# Patient Record
Sex: Male | Born: 1955 | Hispanic: No | Marital: Married | Smoking: Never smoker
Health system: Southern US, Community
[De-identification: ages and names within clinical notes are randomized; demographics above are authoritative.]

## PROBLEM LIST (undated history)

## (undated) DIAGNOSIS — F32A Depression, unspecified: Secondary | ICD-10-CM

## (undated) DIAGNOSIS — M1990 Unspecified osteoarthritis, unspecified site: Secondary | ICD-10-CM

## (undated) DIAGNOSIS — I10 Essential (primary) hypertension: Secondary | ICD-10-CM

## (undated) DIAGNOSIS — F329 Major depressive disorder, single episode, unspecified: Secondary | ICD-10-CM

## (undated) DIAGNOSIS — Z8719 Personal history of other diseases of the digestive system: Secondary | ICD-10-CM

## (undated) DIAGNOSIS — K219 Gastro-esophageal reflux disease without esophagitis: Secondary | ICD-10-CM

## (undated) HISTORY — PX: APPENDECTOMY: SHX54

---

## 2003-03-30 ENCOUNTER — Inpatient Hospital Stay (HOSPITAL_COMMUNITY): Admission: EM | Admit: 2003-03-30 | Discharge: 2003-04-05 | Payer: Self-pay | Admitting: Emergency Medicine

## 2006-03-16 ENCOUNTER — Emergency Department (HOSPITAL_COMMUNITY): Admission: EM | Admit: 2006-03-16 | Discharge: 2006-03-16 | Payer: Self-pay | Admitting: Emergency Medicine

## 2015-04-01 ENCOUNTER — Emergency Department (HOSPITAL_COMMUNITY): Payer: Self-pay

## 2015-04-01 ENCOUNTER — Emergency Department (HOSPITAL_COMMUNITY)
Admission: EM | Admit: 2015-04-01 | Discharge: 2015-04-01 | Disposition: A | Discharge status: Home / Self Care | Payer: Self-pay | Attending: Emergency Medicine | Admitting: Emergency Medicine

## 2015-04-01 ENCOUNTER — Encounter (HOSPITAL_COMMUNITY): Payer: Self-pay

## 2015-04-01 DIAGNOSIS — R112 Nausea with vomiting, unspecified: Secondary | ICD-10-CM | POA: Insufficient documentation

## 2015-04-01 DIAGNOSIS — Z79899 Other long term (current) drug therapy: Secondary | ICD-10-CM | POA: Insufficient documentation

## 2015-04-01 DIAGNOSIS — K8020 Calculus of gallbladder without cholecystitis without obstruction: Secondary | ICD-10-CM | POA: Insufficient documentation

## 2015-04-01 DIAGNOSIS — K8050 Calculus of bile duct without cholangitis or cholecystitis without obstruction: Secondary | ICD-10-CM

## 2015-04-01 DIAGNOSIS — R1110 Vomiting, unspecified: Secondary | ICD-10-CM

## 2015-04-01 DIAGNOSIS — Z87891 Personal history of nicotine dependence: Secondary | ICD-10-CM | POA: Insufficient documentation

## 2015-04-01 DIAGNOSIS — R197 Diarrhea, unspecified: Secondary | ICD-10-CM | POA: Insufficient documentation

## 2015-04-01 DIAGNOSIS — Z9049 Acquired absence of other specified parts of digestive tract: Secondary | ICD-10-CM | POA: Insufficient documentation

## 2015-04-01 DIAGNOSIS — K219 Gastro-esophageal reflux disease without esophagitis: Secondary | ICD-10-CM | POA: Insufficient documentation

## 2015-04-01 LAB — URINALYSIS, ROUTINE W REFLEX MICROSCOPIC
Glucose, UA: NEGATIVE mg/dL
HGB URINE DIPSTICK: NEGATIVE
KETONES UR: NEGATIVE mg/dL
LEUKOCYTES UA: NEGATIVE
Nitrite: NEGATIVE
PROTEIN: NEGATIVE mg/dL
Specific Gravity, Urine: 1.027 (ref 1.005–1.030)
Urobilinogen, UA: 0.2 mg/dL (ref 0.0–1.0)
pH: 5.5 (ref 5.0–8.0)

## 2015-04-01 LAB — CBC WITH DIFFERENTIAL/PLATELET
BASOS PCT: 0 % (ref 0–1)
Basophils Absolute: 0 10*3/uL (ref 0.0–0.1)
EOS ABS: 0 10*3/uL (ref 0.0–0.7)
Eosinophils Relative: 0 % (ref 0–5)
HCT: 50 % (ref 39.0–52.0)
Hemoglobin: 17.4 g/dL — ABNORMAL HIGH (ref 13.0–17.0)
Lymphocytes Relative: 3 % — ABNORMAL LOW (ref 12–46)
Lymphs Abs: 0.3 10*3/uL — ABNORMAL LOW (ref 0.7–4.0)
MCH: 31.4 pg (ref 26.0–34.0)
MCHC: 34.8 g/dL (ref 30.0–36.0)
MCV: 90.1 fL (ref 78.0–100.0)
MONOS PCT: 3 % (ref 3–12)
Monocytes Absolute: 0.3 10*3/uL (ref 0.1–1.0)
NEUTROS PCT: 94 % — AB (ref 43–77)
Neutro Abs: 9.5 10*3/uL — ABNORMAL HIGH (ref 1.7–7.7)
Platelets: 202 10*3/uL (ref 150–400)
RBC: 5.55 MIL/uL (ref 4.22–5.81)
RDW: 12.6 % (ref 11.5–15.5)
WBC: 10.1 10*3/uL (ref 4.0–10.5)

## 2015-04-01 LAB — ETHANOL: Alcohol, Ethyl (B): <5 mg/dL (ref <5)

## 2015-04-01 LAB — COMPREHENSIVE METABOLIC PANEL
ALBUMIN: 4.5 g/dL (ref 3.5–5.0)
ALT: 29 U/L (ref 17–63)
ANION GAP: 10 (ref 5–15)
AST: 28 U/L (ref 15–41)
Alkaline Phosphatase: 59 U/L (ref 38–126)
BILIRUBIN TOTAL: 1.4 mg/dL — AB (ref 0.3–1.2)
BUN: 35 mg/dL — AB (ref 6–20)
CHLORIDE: 103 mmol/L (ref 101–111)
CO2: 23 mmol/L (ref 22–32)
CREATININE: 1.14 mg/dL (ref 0.61–1.24)
Calcium: 9 mg/dL (ref 8.9–10.3)
GFR calc Af Amer: >60 mL/min (ref >60)
GFR calc non Af Amer: >60 mL/min (ref >60)
Glucose, Bld: 167 mg/dL — ABNORMAL HIGH (ref 65–99)
Potassium: 4.2 mmol/L (ref 3.5–5.1)
Sodium: 136 mmol/L (ref 135–145)
Total Protein: 8 g/dL (ref 6.5–8.1)

## 2015-04-01 LAB — TROPONIN I: Troponin I: <0.03 ng/mL (ref <0.031)

## 2015-04-01 LAB — LIPASE, BLOOD: Lipase: 16 U/L — ABNORMAL LOW (ref 22–51)

## 2015-04-01 MED ORDER — SODIUM CHLORIDE 0.9 % IV BOLUS (SEPSIS)
1000.0000 mL | Freq: Once | INTRAVENOUS | Status: AC
  Administered 2015-04-01: 1000 mL via INTRAVENOUS

## 2015-04-01 MED ORDER — LORAZEPAM 2 MG/ML IJ SOLN: 1.0000 mg | Freq: Once | INTRAMUSCULAR | Status: DC

## 2015-04-01 MED ORDER — SODIUM CHLORIDE 0.9 % IV BOLUS (SEPSIS)
1000.0000 mL | INTRAVENOUS | Status: AC
  Administered 2015-04-01: 1000 mL via INTRAVENOUS

## 2015-04-01 MED ORDER — ONDANSETRON HCL 4 MG/2ML IJ SOLN
4.0000 mg | Freq: Once | INTRAMUSCULAR | Status: AC
  Administered 2015-04-01: 4 mg via INTRAVENOUS
  Filled 2015-04-01: qty 2

## 2015-04-01 MED ORDER — ONDANSETRON HCL 4 MG PO TABS: 4.0000 mg | ORAL_TABLET | Freq: Three times a day (TID) | ORAL | Status: DC | PRN

## 2015-04-01 NOTE — ED Notes (Signed)
Pt c/o intermittent RLQ pain, emesis, and diarrhea since midnight.  Pain only w/ emesis.  Denies being around anyone sick.  Pt has not taken anything for symptoms.

## 2015-04-01 NOTE — ED Provider Notes (Signed)
CSN: [id number]     Arrival date & time 04/01/15  0801 History   First MD Initiated Contact with Patient 04/01/15 [phone]     Chief Complaint  Patient presents with  . Abdominal Pain  . Emesis  . Diarrhea     (Consider location/radiation/quality/duration/timing/severity/associated sxs/prior Treatment) HPI   Patient is a 59 year old male with history of daily EtOH, daily NSAID use, GERD, and remote history of cholecystitis 4 years ago, presents to emergency department with sudden onset of forceful vomiting and diarrhea that began last night around midnight and lasted through the night until coming to the ER this morning at approximately 8 AM. One episode of vomiting had some blood, but no further episodes.  He has experienced some associated sweats and chills, but denies any fever.  He feels nauseous, but has stopped actively vomiting because he "is empty."  He has right middle quadrant, non-radiating abdominal pain, which is made worse with vomiting episodes and not exacerbated with palpation, movement or eating/drinking.  He denies any RUQ pain, has no epigastric pain, denies reflux or dyspepsia.  He does not have any flank or back pain.  He drinks beer regularly, states he can handle up to a 12 pack of beer "easily."  He states he "did not drink very much" proceeding his vomiting.  He takes 2-4 aleve daily for chronic hip pain.  He denies CP, palpitations, SOB, abdominal distension, LE swelling, tremor, confusion, head ache.  History reviewed. No pertinent past medical history. Past Surgical History  Procedure Laterality Date  . Appendectomy     History reviewed. No pertinent family history. History  Substance Use Topics  . Smoking status: Former Games developer  . Smokeless tobacco: Not on file  . Alcohol Use: Yes    Review of Systems  HENT: Negative.   Eyes: Negative.   Respiratory: Negative.   Gastrointestinal: Negative for constipation, blood in stool, abdominal distention and anal  bleeding.  Endocrine: Negative.   Genitourinary: Negative.   Skin: Negative.  Negative for color change and rash.  Neurological: Negative for dizziness, tremors, seizures, syncope, facial asymmetry, weakness, light-headedness, numbness and headaches.  Psychiatric/Behavioral: Negative.       Allergies  Review of patient's allergies indicates no known allergies.  Home Medications   Prior to Admission medications   Medication Sig Start Date End Date Taking? Authorizing Provider  naproxen sodium (ANAPROX) 220 MG tablet Take 400 mg by mouth 2 (two) times daily as needed (pain).   Yes Historical Provider, MD  omeprazole (PRILOSEC OTC) 20 MG tablet Take 20 mg by mouth daily.   Yes Historical Provider, MD  ondansetron (ZOFRAN) 4 MG tablet Take 1 tablet (4 mg total) by mouth every 8 (eight) hours as needed for nausea or vomiting. 04/01/15   Danelle Berry, PA-C   BP 140/68 mmHg  Pulse 76  Temp(Src) 97.9 F (36.6 C) (Oral)  Resp 18  SpO2 99% Physical Exam  Constitutional: He is oriented to person, place, and time. He appears well-developed and well-nourished. No distress.  HENT:  Head: Normocephalic and atraumatic.  Right Ear: External ear normal.  Left Ear: External ear normal.  Nose: Nose normal.  Mouth/Throat: Oropharynx is clear and moist. Mucous membranes are not pale, dry and not cyanotic. No oropharyngeal exudate, posterior oropharyngeal edema or posterior oropharyngeal erythema.  Eyes: Conjunctivae, EOM and lids are normal. Pupils are equal, round, and reactive to light. Right eye exhibits no chemosis and no discharge. Left eye exhibits no chemosis and no discharge. Right  conjunctiva is not injected. Right conjunctiva has no hemorrhage. Left conjunctiva is not injected. Left conjunctiva has no hemorrhage. No scleral icterus.  Neck: Normal range of motion and full passive range of motion without pain. Neck supple. No hepatojugular reflux and no JVD present. No tracheal tenderness  present. No tracheal deviation present. No thyromegaly present.  Cardiovascular: Normal rate, regular rhythm, normal heart sounds, intact distal pulses and normal pulses.  Exam reveals no gallop, no friction rub and no decreased pulses.   No murmur heard. Pulmonary/Chest: Effort normal and breath sounds normal. No accessory muscle usage or stridor. No tachypnea. No respiratory distress. He has no decreased breath sounds. He has no wheezes. He has no rales. He exhibits no tenderness.  Abdominal: Soft. Bowel sounds are normal. He exhibits no shifting dullness, no distension, no pulsatile liver, no abdominal bruit, no ascites and no mass. There is no hepatomegaly. There is no tenderness. There is no rigidity, no rebound, no guarding, no CVA tenderness, no tenderness at McBurney's point and negative Murphy's sign. No hernia.  Musculoskeletal: Normal range of motion. He exhibits no edema or tenderness.  Lymphadenopathy:    He has no cervical adenopathy.  Neurological: He is alert and oriented to person, place, and time. He has normal reflexes. He displays no tremor. No cranial nerve deficit or sensory deficit. He exhibits normal muscle tone. He displays no seizure activity. Coordination and gait normal.  No ataxia  Skin: Skin is warm, dry and intact. No rash noted. He is not diaphoretic. No cyanosis or erythema. No pallor. Nails show no clubbing.  Psychiatric: He has a normal mood and affect. His speech is normal and behavior is normal. Judgment and thought content normal. Cognition and memory are normal.  Nursing note and vitals reviewed.  ED Course  Procedures (including critical care time) Labs Review Labs Reviewed  COMPREHENSIVE METABOLIC PANEL - Abnormal; Notable for the following:    Glucose, Bld 167 (*)    BUN 35 (*)    Total Bilirubin 1.4 (*)    All other components within normal limits  LIPASE, BLOOD - Abnormal; Notable for the following:    Lipase 16 (*)    All other components within  normal limits  CBC WITH DIFFERENTIAL/PLATELET - Abnormal; Notable for the following:    Hemoglobin 17.4 (*)    Neutrophils Relative % 94 (*)    Neutro Abs 9.5 (*)    Lymphocytes Relative 3 (*)    Lymphs Abs 0.3 (*)    All other components within normal limits  URINALYSIS, ROUTINE W REFLEX MICROSCOPIC (NOT [facility]) - Abnormal; Notable for the following:    Color, Urine AMBER (*)    APPearance CLOUDY (*)    Bilirubin Urine SMALL (*)    All other components within normal limits  ETHANOL  TROPONIN I    Imaging Review [place] Abdomen Complete  04/01/2015   CLINICAL DATA:  Right lower quadrant pain, vomiting, and diarrhea since last night. Prior appendectomy.  EXAM: ULTRASOUND ABDOMEN COMPLETE  COMPARISON:  None.  FINDINGS: Gallbladder: Distended gallbladder measuring approximately 10.4 cm in length and 5.5 cm in width. Slight diffuse gallbladder wall thickening and slight irregularity, with gallbladder wall thickness 4.2 mm. Small amount of debris/ sludge in the gallbladder without definite stones identified. 2.4 mm echogenic focus adherent to the gallbladder wall without posterior shadowing, likely a tiny incidental polyp. Sonographic Murphy sign was negative.  Common bile duct: Diameter: 6 mm  Liver: Slightly heterogeneous echotexture without focal abnormality identified.  IVC: No abnormality visualized.  Pancreas: Not well visualized.  Visualized portion unremarkable.  Spleen: Size and appearance within normal limits.  Right Kidney: Length: 10.6 cm. Echogenicity within normal limits. No mass or hydronephrosis visualized.  Left Kidney: Length: 11.1 cm. Echogenicity within normal limits. No mass or hydronephrosis visualized.  Abdominal aorta: No aneurysm visualized. Proximal abdominal aorta not visualized.  Other findings: None.  IMPRESSION: Mildly hydropic and slightly thick-walled gallbladder containing a small amount of debris/sludge without stones identified. If there is concern for acute  cholecystitis, consider further evaluation with nuclear medicine HIDA scan.   Electronically Signed   By: Sebastian Ache   On: 04/01/2015 11:50    MDM   Final diagnoses:  Vomiting  Biliary colic  Diarrhea    N/V/D with right side of abdominal pain - Ddx: cholecystitis, pancreatitis, viral gastroenteritis, with possible esophageal varicies vs mallory weis tears.  I have very low suspicion for appendicitis, gastric/peptic ulcers, kidney stone, pyelonephritis, bowel obstruction or other acute abdominal pathology.  Given pt's history, suspect some baseline liver and/or pancreas pathology, he does not appear acutely ill at this time, appears dry, but is not jaundice.  Plan - CBC, CMP, lipase, ETOH, IVF w/ zofran, UA, RUQ [place]  Discussed pt with Dr. Littie Deeds who will also see and evaluate pt. - added on EKG and trop  RUQ [place] pertinent for biliary sludge, no cholelithiasis, no concern for cholecystitis - suggests HIDA scan.  Liver slightly heterogeneous Labs pertinent for mild elevation of bili at 1.4, and bilirubin in urine, glucose mildly elevated BUN elevated with elevated hemoglobin - most likely from dehydration. Labs negative for elevated lipase or liver enzymes.    I have discussed these findings with the patient and suggested follow-up with GI.  Pt verbalized understanding.  He had some to the ER because of his wife's concern for his gallbladder.  The patient stated that he was going to stop drinking.  I explained the effects of ETOH, including liver disease, pancreatitis, and other diseases of the heart, lungs and brain.  I additionally explained the signs and sx of alcohol withdrawal and DT's.  Return precautions were given for DT's and acute cholecystitis, which was verbally acknowledged by the pt and his wife.   The pt has had no vomiting while in the ER.  PO trial successful. Vitals reviewed and stable.  Pt was discharged with Zofran and information about viral gastroenteritis, biliary  colic, DT's.  Filed Vitals:   04/01/15 0815 04/01/15 1103  BP: 138/95 140/68  Pulse: 97 76  Temp: 97.9 F (36.6 C)   TempSrc: Oral   Resp: 16 18  SpO2: 98% 99%          Danelle Berry, PA-C 04/02/15 1656  Mirian Mo, MD 04/03/15 1330

## 2015-04-01 NOTE — Discharge Instructions (Signed)
Biliary Colic  °Biliary colic is a steady or irregular pain in the upper abdomen. It is usually under the right side of the rib cage. It happens when gallstones interfere with the normal flow of bile from the gallbladder. Bile is a liquid that helps to digest fats. Bile is made in the liver and stored in the gallbladder. When you eat a meal, bile passes from the gallbladder through the cystic duct and the common bile duct into the small intestine. There, it mixes with partially digested food. If a gallstone blocks either of these ducts, the normal flow of bile is blocked. The muscle cells in the bile duct contract forcefully to try to move the stone. This causes the pain of biliary colic.  °SYMPTOMS  °· A person with biliary colic usually complains of pain in the upper abdomen. This pain can be: °· In the center of the upper abdomen just below the breastbone. °· In the upper-right part of the abdomen, near the gallbladder and liver. °· Spread back toward the right shoulder blade. °· Nausea and vomiting. °· The pain usually occurs after eating. °· Biliary colic is usually triggered by the digestive system's demand for bile. The demand for bile is high after fatty meals. Symptoms can also occur when a person who has been fasting suddenly eats a very large meal. Most episodes of biliary colic pass after 1 to 5 hours. After the most intense pain passes, your abdomen may continue to ache mildly for about 24 hours. °DIAGNOSIS  °After you describe your symptoms, your caregiver will perform a physical exam. He or she will pay attention to the upper right portion of your belly (abdomen). This is the area of your liver and gallbladder. An ultrasound will help your caregiver look for gallstones. Specialized scans of the gallbladder may also be done. Blood tests may be done, especially if you have fever or if your pain persists. °PREVENTION  °Biliary colic can be prevented by controlling the risk factors for gallstones. Some of  these risk factors, such as heredity, increasing age, and pregnancy are a normal part of life. Obesity and a high-fat diet are risk factors you can change through a healthy lifestyle. Women going through menopause who take hormone replacement therapy (estrogen) are also more likely to develop biliary colic. °TREATMENT  °· Pain medication may be prescribed. °· You may be encouraged to eat a fat-free diet. °· If the first episode of biliary colic is severe, or episodes of colic keep retuning, surgery to remove the gallbladder (cholecystectomy) is usually recommended. This procedure can be done through small incisions using an instrument called a laparoscope. The procedure often requires a brief stay in the hospital. Some people can leave the hospital the same day. It is the most widely used treatment in people troubled by painful gallstones. It is effective and safe, with no complications in more than 90% of cases. °· If surgery cannot be done, medication that dissolves gallstones may be used. This medication is expensive and can take months or years to work. Only small stones will dissolve. °· Rarely, medication to dissolve gallstones is combined with a procedure called shock-wave lithotripsy. This procedure uses carefully aimed shock waves to break up gallstones. In many people treated with this procedure, gallstones form again within a few years. °PROGNOSIS  °If gallstones block your cystic duct or common bile duct, you are at risk for repeated episodes of biliary colic. There is also a 25% chance that you will develop   a gallbladder infection(acute cholecystitis), or some other complication of gallstones within 10 to 20 years. If you have surgery, schedule it at a time that is convenient for you and at a time when you are not sick. HOME CARE INSTRUCTIONS   Drink plenty of clear fluids.  Avoid fatty, greasy or fried foods, or any foods that make your pain worse.  Take medications as directed. SEEK MEDICAL  CARE IF:   You develop a fever over 100.5 F (38.1 C).  Your pain gets worse over time.  You develop nausea that prevents you from eating and drinking.  You develop vomiting. SEEK IMMEDIATE MEDICAL CARE IF:   You have continuous or severe belly (abdominal) pain which is not relieved with medications.  You develop nausea and vomiting which is not relieved with medications.  You have symptoms of biliary colic and you suddenly develop a fever and shaking chills. This may signal cholecystitis. Call your caregiver immediately.  You develop a yellow color to your skin or the white part of your eyes (jaundice). Document Released: 02/21/2006 Document Revised: 12/13/2011 Document Reviewed: 05/02/2008 [facility] Patient Information 2015 [place], [place]. This information is not intended to replace advice given to you by your health care provider. Make sure you discuss any questions you have with your health care provider.  Cholecystitis Cholecystitis is an inflammation of your gallbladder. It is usually caused by a buildup of gallstones or sludge (cholelithiasis) in your gallbladder. The gallbladder stores a fluid that helps digest fats (bile). Cholecystitis is serious and needs treatment right away.  CAUSES   Gallstones. Gallstones can block the tube that leads to your gallbladder, causing bile to build up. As bile builds up, the gallbladder becomes inflamed.  Bile duct problems, such as blockage from scarring or kinking.  Tumors. Tumors can stop bile from leaving your gallbladder correctly, causing bile to build up. As bile builds up, the gallbladder becomes inflamed. SYMPTOMS   Nausea.  Vomiting.  Abdominal pain, especially in the upper right area of your abdomen.  Abdominal tenderness or bloating.  Sweating.  Chills.  Fever.  Yellowing of the skin and the whites of the eyes (jaundice). DIAGNOSIS  Your caregiver may order blood tests to look for infection or gallbladder  problems. Your caregiver may also order imaging tests, such as an ultrasound or computed tomography (CT) scan. Further tests may include a hepatobiliary iminodiacetic acid (HIDA) scan. This scan allows your caregiver to see your bile move from the liver to the gallbladder and to the small intestine. TREATMENT  A hospital stay is usually necessary to lessen the inflammation of your gallbladder. You may be required to not eat or drink (fast) for a certain amount of time. You may be given medicine to treat pain or an antibiotic medicine to treat an infection. Surgery may be needed to remove your gallbladder (cholecystectomy) once the inflammation has gone down. Surgery may be needed right away if you develop complications such as death of gallbladder tissue (gangrene) or a tear (perforation) of the gallbladder.  [place] care will depend on your treatment. In general:  If you were given antibiotics, take them as directed. Finish them even if you start to feel better.  Only take over-the-counter or prescription medicines for pain, discomfort, or fever as directed by your caregiver.  Follow a low-fat diet until you see your caregiver again.  Keep all follow-up visits as directed by your caregiver. SEEK IMMEDIATE MEDICAL CARE IF:   Your pain is  increasing and not controlled by medicines.  Your pain moves to another part of your abdomen or to your back.  You have a fever.  You have nausea and vomiting. MAKE SURE YOU:  Understand these instructions.  Will watch your condition.  Will get help right away if you are not doing well or get worse. Document Released: 09/20/2005 Document Revised: 12/13/2011 Document Reviewed: 08/06/2011 [facility] Patient Information 2015 [place]. This information is not intended to replace advice given to you by your health care provider. Make sure you discuss any questions you have with your health care provider.

## 2015-12-10 ENCOUNTER — Encounter (HOSPITAL_COMMUNITY): Payer: Self-pay

## 2015-12-10 ENCOUNTER — Encounter (HOSPITAL_COMMUNITY)
Admission: RE | Admit: 2015-12-10 | Discharge: 2015-12-10 | Disposition: A | Discharge status: Home / Self Care | Payer: BLUE CROSS/BLUE SHIELD | Source: Ambulatory Visit | Attending: Orthopedic Surgery | Admitting: Orthopedic Surgery

## 2015-12-10 DIAGNOSIS — M1612 Unilateral primary osteoarthritis, left hip: Secondary | ICD-10-CM | POA: Diagnosis not present

## 2015-12-10 DIAGNOSIS — Z0183 Encounter for blood typing: Secondary | ICD-10-CM | POA: Diagnosis not present

## 2015-12-10 DIAGNOSIS — Z01812 Encounter for preprocedural laboratory examination: Secondary | ICD-10-CM | POA: Diagnosis not present

## 2015-12-10 HISTORY — DX: Gastro-esophageal reflux disease without esophagitis: K21.9

## 2015-12-10 HISTORY — DX: Rider (driver) (passenger) of other motorcycle injured in unspecified traffic accident, initial encounter: V29.99XA

## 2015-12-10 HISTORY — DX: Major depressive disorder, single episode, unspecified: F32.9

## 2015-12-10 HISTORY — DX: Personal history of other diseases of the digestive system: Z87.19

## 2015-12-10 HISTORY — DX: Unspecified osteoarthritis, unspecified site: M19.90

## 2015-12-10 HISTORY — DX: Essential (primary) hypertension: I10

## 2015-12-10 HISTORY — DX: Depression, unspecified: F32.A

## 2015-12-10 LAB — CBC
HCT: 46 % (ref 39.0–52.0)
Hemoglobin: 15.8 g/dL (ref 13.0–17.0)
MCH: 31.3 pg (ref 26.0–34.0)
MCHC: 34.3 g/dL (ref 30.0–36.0)
MCV: 91.1 fL (ref 78.0–100.0)
PLATELETS: 207 10*3/uL (ref 150–400)
RBC: 5.05 MIL/uL (ref 4.22–5.81)
RDW: 12.2 % (ref 11.5–15.5)
WBC: 7.6 10*3/uL (ref 4.0–10.5)

## 2015-12-10 LAB — BASIC METABOLIC PANEL
Anion gap: 9 (ref 5–15)
BUN: 18 mg/dL (ref 6–20)
CALCIUM: 9.3 mg/dL (ref 8.9–10.3)
CO2: 24 mmol/L (ref 22–32)
CREATININE: 1.06 mg/dL (ref 0.61–1.24)
Chloride: 102 mmol/L (ref 101–111)
GFR calc Af Amer: >60 mL/min (ref >60)
Glucose, Bld: 114 mg/dL — ABNORMAL HIGH (ref 65–99)
Potassium: 4.1 mmol/L (ref 3.5–5.1)
SODIUM: 135 mmol/L (ref 135–145)

## 2015-12-10 LAB — URINALYSIS, ROUTINE W REFLEX MICROSCOPIC
Bilirubin Urine: NEGATIVE
Glucose, UA: NEGATIVE mg/dL
HGB URINE DIPSTICK: NEGATIVE
Ketones, ur: NEGATIVE mg/dL
Leukocytes, UA: NEGATIVE
Nitrite: NEGATIVE
Protein, ur: NEGATIVE mg/dL
SPECIFIC GRAVITY, URINE: 1.004 — AB (ref 1.005–1.030)
pH: 5.5 (ref 5.0–8.0)

## 2015-12-10 LAB — ABO/RH: ABO/RH(D): B POS

## 2015-12-10 LAB — TYPE AND SCREEN
ABO/RH(D): B POS
Antibody Screen: NEGATIVE

## 2015-12-10 LAB — PROTIME-INR
INR: 1.08 (ref 0.00–1.49)
PROTHROMBIN TIME: 13.8 s (ref 11.6–15.2)

## 2015-12-10 LAB — APTT: APTT: 29 s (ref 24–37)

## 2015-12-10 LAB — SURGICAL PCR SCREEN
MRSA, PCR: NEGATIVE
Staphylococcus aureus: POSITIVE — AB

## 2015-12-10 NOTE — Progress Notes (Signed)
EKG epic 04/01/2015

## 2015-12-10 NOTE — Patient Instructions (Signed)
Corey Logan  12/10/2015   Your procedure is scheduled on: Monday December 22, 2015  Report to [facility] Main  Entrance take [facility]  elevators to 3rd floor to  Short Stay Center at 8:15  AM.  Call this number if you have problems the morning of surgery [phone]   Remember: ONLY 1 PERSON MAY GO WITH YOU TO SHORT STAY TO GET  READY MORNING OF YOUR SURGERY.  Do not eat food or drink liquids :After Midnight.     Take these medicines the morning of surgery with A SIP OF WATER: Omeprazole, Amlodipine                                You may not have any metal on your body including hair pins and              piercings  Do not wear jewelry,  lotions, powders or colognes, deodorant              Men may shave face and neck.   Do not bring valuables to the hospital. [place] IS NOT             RESPONSIBLE   FOR VALUABLES.  Contacts, dentures or bridgework may not be worn into surgery.  Leave suitcase in the car. After surgery it may be brought to your room.                Please read over the following fact sheets you were given:MRSA INFORMATION SHEET; INCENTIVE SPIROMETER; BLOOD TRANSFUSION INFORMATION SHEET  _____________________________________________________________________             [facility] - Preparing for Surgery Before surgery, you can play an important role.  Because skin is not sterile, your skin needs to be as free of germs as possible.  You can reduce the number of germs on your skin by washing with CHG (chlorahexidine gluconate) soap before surgery.  CHG is an antiseptic cleaner which kills germs and bonds with the skin to continue killing germs even after washing. Please DO NOT use if you have an allergy to CHG or antibacterial soaps.  If your skin becomes reddened/irritated stop using the CHG and inform your nurse when you arrive at Short Stay. Do not shave (including legs and underarms) for at least 48 hours prior to the first CHG shower.  You  may shave your face/neck. Please follow these instructions carefully:  1.  Shower with CHG Soap the night before surgery and the  morning of Surgery.  2.  If you choose to wash your hair, wash your hair first as usual with your  normal  shampoo.  3.  After you shampoo, rinse your hair and body thoroughly to remove the  shampoo.                           4.  Use CHG as you would any other liquid soap.  You can apply chg directly  to the skin and wash                       Gently with a scrungie or clean washcloth.  5.  Apply the CHG Soap to your body ONLY FROM THE NECK DOWN.   Do not use on face/  open                           Wound or open sores. Avoid contact with eyes, ears mouth and genitals (private parts).                       Wash face,  Genitals (private parts) with your normal soap.             6.  Wash thoroughly, paying special attention to the area where your surgery  will be performed.  7.  Thoroughly rinse your body with warm water from the neck down.  8.  DO NOT shower/wash with your normal soap after using and rinsing off  the CHG Soap.                9.  Pat yourself dry with a clean towel.            10.  Wear clean pajamas.            11.  Place clean sheets on your bed the night of your first shower and do not  sleep with pets. Day of Surgery : Do not apply any lotions/deodorants the morning of surgery.  Please wear clean clothes to the hospital/surgery center.  FAILURE TO FOLLOW THESE INSTRUCTIONS MAY RESULT IN THE CANCELLATION OF YOUR SURGERY PATIENT SIGNATURE_________________________________  NURSE SIGNATURE__________________________________  ________________________________________________________________________   Rogelia MireIncentive Spirometer  An incentive spirometer is a tool that can help keep your lungs clear and active. This tool measures how well you are filling your lungs with each breath. Taking long deep breaths may help reverse or decrease the chance of  developing breathing (pulmonary) problems (especially infection) following:  A long period of time when you are unable to move or be active. BEFORE THE PROCEDURE   If the spirometer includes an indicator to show your best effort, your nurse or respiratory therapist will set it to a desired goal.  If possible, sit up straight or lean slightly forward. Try not to slouch.  Hold the incentive spirometer in an upright position. INSTRUCTIONS FOR USE   Sit on the edge of your bed if possible, or sit up as far as you can in bed or on a chair.  Hold the incentive spirometer in an upright position.  Breathe out normally.  Place the mouthpiece in your mouth and seal your lips tightly around it.  Breathe in slowly and as deeply as possible, raising the piston or the ball toward the top of the column.  Hold your breath for 3-5 seconds or for as long as possible. Allow the piston or ball to fall to the bottom of the column.  Remove the mouthpiece from your mouth and breathe out normally.  Rest for a few seconds and repeat Steps 1 through 7 at least 10 times every 1-2 hours when you are awake. Take your time and take a few normal breaths between deep breaths.  The spirometer may include an indicator to show your best effort. Use the indicator as a goal to work toward during each repetition.  After each set of 10 deep breaths, practice coughing to be sure your lungs are clear. If you have an incision (the cut made at the time of surgery), support your incision when coughing by placing a pillow or rolled up towels firmly against it. Once you are able to get out of bed, walk around indoors and  cough well. You may stop using the incentive spirometer when instructed by your caregiver.  RISKS AND COMPLICATIONS  Take your time so you do not get dizzy or light-headed.  If you are in pain, you may need to take or ask for pain medication before doing incentive spirometry. It is harder to take a deep  breath if you are having pain. AFTER USE  Rest and breathe slowly and easily.  It can be helpful to keep track of a log of your progress. Your caregiver can provide you with a simple table to help with this. If you are using the spirometer at home, follow these instructions: [place] IF:   You are having difficultly using the spirometer.  You have trouble using the spirometer as often as instructed.  Your pain medication is not giving enough relief while using the spirometer.  You develop fever of 100.5 F (38.1 C) or higher. SEEK IMMEDIATE MEDICAL CARE IF:   You cough up bloody sputum that had not been present before.  You develop fever of 102 F (38.9 C) or greater.  You develop worsening pain at or near the incision site. MAKE SURE YOU:   Understand these instructions.  Will watch your condition.  Will get help right away if you are not doing well or get worse. Document Released: 01/31/2007 Document Revised: 12/13/2011 Document Reviewed: 04/03/2007 ExitCare Patient Information 2014 Exit[employer], [place].   ________________________________________________________________________  WHAT IS A BLOOD TRANSFUSION? Blood Transfusion Information  A transfusion is the replacement of blood or some of its parts. Blood is made up of multiple cells which provide different functions.  Red blood cells carry oxygen and are used for blood loss replacement.  White blood cells fight against infection.  Platelets control bleeding.  Plasma helps clot blood.  Other blood products are available for specialized needs, such as hemophilia or other clotting disorders. BEFORE THE TRANSFUSION  Who gives blood for transfusions?   Healthy volunteers who are fully evaluated to make sure their blood is safe. This is blood bank blood. Transfusion therapy is the safest it has ever been in the practice of medicine. Before blood is taken from a donor, a complete history is taken to make sure  that person has no history of diseases nor engages in risky social behavior (examples are intravenous drug use or sexual activity with multiple partners). The donor's travel history is screened to minimize risk of transmitting infections, such as malaria. The donated blood is tested for signs of infectious diseases, such as HIV and hepatitis. The blood is then tested to be sure it is compatible with you in order to minimize the chance of a transfusion reaction. If you or a relative donates blood, this is often done in anticipation of surgery and is not appropriate for emergency situations. It takes many days to process the donated blood. RISKS AND COMPLICATIONS Although transfusion therapy is very safe and saves many lives, the main dangers of transfusion include:   Getting an infectious disease.  Developing a transfusion reaction. This is an allergic reaction to something in the blood you were given. Every precaution is taken to prevent this. The decision to have a blood transfusion has been considered carefully by your caregiver before blood is given. Blood is not given unless the benefits outweigh the risks. AFTER THE TRANSFUSION  Right after receiving a blood transfusion, you will usually feel much better and more energetic. This is especially true if your red blood cells have gotten low (anemic).  The transfusion raises the level of the red blood cells which carry oxygen, and this usually causes an energy increase.  The nurse administering the transfusion will monitor you carefully for complications. HOME CARE INSTRUCTIONS  No special instructions are needed after a transfusion. You may find your energy is better. Speak with your caregiver about any limitations on activity for underlying diseases you may have. SEEK MEDICAL CARE IF:   Your condition is not improving after your transfusion.  You develop redness or irritation at the intravenous (IV) site. SEEK IMMEDIATE MEDICAL CARE IF:  Any of  the following symptoms occur over the next 12 hours:  Shaking chills.  You have a temperature by mouth above 102 F (38.9 C), not controlled by medicine.  Chest, back, or muscle pain.  People around you feel you are not acting correctly or are confused.  Shortness of breath or difficulty breathing.  Dizziness and fainting.  You get a rash or develop hives.  You have a decrease in urine output.  Your urine turns a dark color or changes to pink, red, or brown. Any of the following symptoms occur over the next 10 days:  You have a temperature by mouth above 102 F (38.9 C), not controlled by medicine.  Shortness of breath.  Weakness after normal activity.  The white part of the eye turns yellow (jaundice).  You have a decrease in the amount of urine or are urinating less often.  Your urine turns a dark color or changes to pink, red, or brown. Document Released: 09/17/2000 Document Revised: 12/13/2011 Document Reviewed: 05/06/2008 [facility] Patient Information 2014 [place], [place].  _______________________________________________________________________

## 2015-12-10 NOTE — Progress Notes (Signed)
Clearance note per chart per Dr Nicholos Johnseade 12/08/2015 noting close monitoring of BP pt was just started on Amlodipine 5 mg daily

## 2015-12-10 NOTE — Progress Notes (Addendum)
Surgical screening results in epic per PAT visit 12/10/2015 positive for STAPH. Results sent to Dr Charlann Boxerlin. Prescription for Mupriocin Ointment called to [facility] spoke with Tresa EndoKelly - pharmacist. Spoke with pts wife Tresa EndoKelly.

## 2015-12-11 NOTE — Progress Notes (Signed)
Patients wife notified of surgical time change. Aware pt is to arrive at [facility] at 8:00am 12/22/2015.
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
## 2015-12-22 ENCOUNTER — Inpatient Hospital Stay (HOSPITAL_COMMUNITY): Payer: BLUE CROSS/BLUE SHIELD

## 2015-12-22 ENCOUNTER — Encounter (HOSPITAL_COMMUNITY): Payer: Self-pay | Admitting: *Deleted

## 2015-12-22 ENCOUNTER — Inpatient Hospital Stay (HOSPITAL_COMMUNITY)
Admission: RE | Admit: 2015-12-22 | Discharge: 2015-12-23 | DRG: 470 | Disposition: A | Discharge status: Home Health | Payer: BLUE CROSS/BLUE SHIELD | Source: Ambulatory Visit | Attending: Orthopedic Surgery | Admitting: Orthopedic Surgery

## 2015-12-22 ENCOUNTER — Inpatient Hospital Stay (HOSPITAL_COMMUNITY): Payer: BLUE CROSS/BLUE SHIELD | Admitting: Anesthesiology

## 2015-12-22 ENCOUNTER — Encounter (HOSPITAL_COMMUNITY)
Admission: RE | Disposition: A | Discharge status: Home Health | Payer: Self-pay | Source: Ambulatory Visit | Attending: Orthopedic Surgery

## 2015-12-22 DIAGNOSIS — Z79899 Other long term (current) drug therapy: Secondary | ICD-10-CM | POA: Diagnosis not present

## 2015-12-22 DIAGNOSIS — I10 Essential (primary) hypertension: Secondary | ICD-10-CM | POA: Diagnosis present

## 2015-12-22 DIAGNOSIS — M25552 Pain in left hip: Secondary | ICD-10-CM | POA: Diagnosis present

## 2015-12-22 DIAGNOSIS — Z6829 Body mass index (BMI) 29.0-29.9, adult: Secondary | ICD-10-CM | POA: Diagnosis not present

## 2015-12-22 DIAGNOSIS — E663 Overweight: Secondary | ICD-10-CM | POA: Diagnosis present

## 2015-12-22 DIAGNOSIS — M1612 Unilateral primary osteoarthritis, left hip: Secondary | ICD-10-CM | POA: Diagnosis present

## 2015-12-22 DIAGNOSIS — Z96649 Presence of unspecified artificial hip joint: Secondary | ICD-10-CM

## 2015-12-22 DIAGNOSIS — K219 Gastro-esophageal reflux disease without esophagitis: Secondary | ICD-10-CM | POA: Diagnosis present

## 2015-12-22 DIAGNOSIS — Z01812 Encounter for preprocedural laboratory examination: Secondary | ICD-10-CM | POA: Diagnosis not present

## 2015-12-22 HISTORY — PX: TOTAL HIP ARTHROPLASTY: SHX124

## 2015-12-22 SURGERY — ARTHROPLASTY, HIP, TOTAL, ANTERIOR APPROACH
Anesthesia: Spinal | Site: Hip | Laterality: Left

## 2015-12-22 MED ORDER — HYDROMORPHONE HCL 1 MG/ML IJ SOLN
INTRAMUSCULAR | Status: AC
  Filled 2015-12-22: qty 1

## 2015-12-22 MED ORDER — PROPOFOL 10 MG/ML IV BOLUS
INTRAVENOUS | Status: AC
Start: 2015-12-22 — End: 2015-12-22
  Filled 2015-12-22: qty 20

## 2015-12-22 MED ORDER — BUPIVACAINE HCL (PF) 0.75 % IJ SOLN
INTRAMUSCULAR | Status: DC | PRN
  Administered 2015-12-22: 15 mg via INTRATHECAL

## 2015-12-22 MED ORDER — PROPOFOL 10 MG/ML IV BOLUS
INTRAVENOUS | Status: AC
  Filled 2015-12-22: qty 20

## 2015-12-22 MED ORDER — LACTATED RINGERS IV SOLN: INTRAVENOUS | Status: DC

## 2015-12-22 MED ORDER — ALUM & MAG HYDROXIDE-SIMETH 200-200-20 MG/5ML PO SUSP: 30.0000 mL | ORAL | Status: DC | PRN

## 2015-12-22 MED ORDER — BISACODYL 10 MG RE SUPP
10.0000 mg | Freq: Every day | RECTAL | Status: DC | PRN
Start: 2015-12-22 — End: 2015-12-23

## 2015-12-22 MED ORDER — SODIUM CHLORIDE 0.9 % IV SOLN
100.0000 mL/h | INTRAVENOUS | Status: DC
  Administered 2015-12-22 – 2015-12-23 (×2): 100 mL/h via INTRAVENOUS
  Filled 2015-12-22 (×4): qty 1000

## 2015-12-22 MED ORDER — DIPHENHYDRAMINE HCL 25 MG PO CAPS: 25.0000 mg | ORAL_CAPSULE | Freq: Four times a day (QID) | ORAL | Status: DC | PRN

## 2015-12-22 MED ORDER — CEFAZOLIN SODIUM-DEXTROSE 2-3 GM-% IV SOLR
2.0000 g | Freq: Four times a day (QID) | INTRAVENOUS | Status: AC
  Administered 2015-12-22 (×2): 2 g via INTRAVENOUS
  Filled 2015-12-22 (×2): qty 50

## 2015-12-22 MED ORDER — CELECOXIB 200 MG PO CAPS
200.0000 mg | ORAL_CAPSULE | Freq: Two times a day (BID) | ORAL | Status: DC
  Administered 2015-12-22 – 2015-12-23 (×2): 200 mg via ORAL
  Filled 2015-12-22 (×3): qty 1

## 2015-12-22 MED ORDER — ASPIRIN 325 MG PO TBEC: 325.0000 mg | DELAYED_RELEASE_TABLET | Freq: Two times a day (BID) | ORAL | Status: AC

## 2015-12-22 MED ORDER — FERROUS SULFATE 325 (65 FE) MG PO TABS
325.0000 mg | ORAL_TABLET | Freq: Three times a day (TID) | ORAL | Status: DC
  Administered 2015-12-22 – 2015-12-23 (×2): 325 mg via ORAL
  Filled 2015-12-22 (×5): qty 1

## 2015-12-22 MED ORDER — PHENYLEPHRINE HCL 10 MG/ML IJ SOLN
INTRAMUSCULAR | Status: DC | PRN
  Administered 2015-12-22 (×7): 80 ug via INTRAVENOUS

## 2015-12-22 MED ORDER — PHENYLEPHRINE 40 MCG/ML (10ML) SYRINGE FOR IV PUSH (FOR BLOOD PRESSURE SUPPORT)
PREFILLED_SYRINGE | INTRAVENOUS | Status: AC
  Filled 2015-12-22: qty 10

## 2015-12-22 MED ORDER — SODIUM CHLORIDE 0.9 % IR SOLN
Status: DC | PRN
  Administered 2015-12-22: 1000 mL

## 2015-12-22 MED ORDER — MENTHOL 3 MG MT LOZG: 1.0000 | LOZENGE | OROMUCOSAL | Status: DC | PRN

## 2015-12-22 MED ORDER — TRANEXAMIC ACID 1000 MG/10ML IV SOLN
1000.0000 mg | Freq: Once | INTRAVENOUS | Status: AC
  Administered 2015-12-22: 1000 mg via INTRAVENOUS
  Filled 2015-12-22: qty 10

## 2015-12-22 MED ORDER — METOCLOPRAMIDE HCL 10 MG PO TABS: 5.0000 mg | ORAL_TABLET | Freq: Three times a day (TID) | ORAL | Status: DC | PRN

## 2015-12-22 MED ORDER — METOCLOPRAMIDE HCL 5 MG/ML IJ SOLN: 5.0000 mg | Freq: Three times a day (TID) | INTRAMUSCULAR | Status: DC | PRN

## 2015-12-22 MED ORDER — PHENOL 1.4 % MT LIQD
1.0000 | OROMUCOSAL | Status: DC | PRN
Start: 2015-12-22 — End: 2015-12-23
  Filled 2015-12-22: qty 177

## 2015-12-22 MED ORDER — MIDAZOLAM HCL 2 MG/2ML IJ SOLN
INTRAMUSCULAR | Status: DC | PRN
  Administered 2015-12-22: 2 mg via INTRAVENOUS

## 2015-12-22 MED ORDER — DOCUSATE SODIUM 100 MG PO CAPS
100.0000 mg | ORAL_CAPSULE | Freq: Two times a day (BID) | ORAL | Status: DC
  Administered 2015-12-22 – 2015-12-23 (×2): 100 mg via ORAL

## 2015-12-22 MED ORDER — HYDROCODONE-ACETAMINOPHEN 7.5-325 MG PO TABS
1.0000 | ORAL_TABLET | ORAL | Status: DC
  Administered 2015-12-22: 1 via ORAL
  Administered 2015-12-22: 2 via ORAL
  Administered 2015-12-22 – 2015-12-23 (×2): 1 via ORAL
  Administered 2015-12-23: 2 via ORAL
  Filled 2015-12-22: qty 2
  Filled 2015-12-22 (×3): qty 1
  Filled 2015-12-22: qty 2

## 2015-12-22 MED ORDER — CEFAZOLIN SODIUM-DEXTROSE 2-3 GM-% IV SOLR
INTRAVENOUS | Status: AC
  Filled 2015-12-22: qty 50

## 2015-12-22 MED ORDER — ONDANSETRON HCL 4 MG/2ML IJ SOLN
INTRAMUSCULAR | Status: AC
  Filled 2015-12-22: qty 2

## 2015-12-22 MED ORDER — PROPOFOL 500 MG/50ML IV EMUL
INTRAVENOUS | Status: DC | PRN
  Administered 2015-12-22: 100 ug/kg/min via INTRAVENOUS

## 2015-12-22 MED ORDER — KETAMINE HCL 10 MG/ML IJ SOLN
INTRAMUSCULAR | Status: AC
  Filled 2015-12-22: qty 1

## 2015-12-22 MED ORDER — POLYETHYLENE GLYCOL 3350 17 G PO PACK
17.0000 g | PACK | Freq: Two times a day (BID) | ORAL | Status: DC
  Administered 2015-12-22 – 2015-12-23 (×2): 17 g via ORAL

## 2015-12-22 MED ORDER — DEXAMETHASONE SODIUM PHOSPHATE 10 MG/ML IJ SOLN
INTRAMUSCULAR | Status: AC
  Filled 2015-12-22: qty 1

## 2015-12-22 MED ORDER — DOCUSATE SODIUM 100 MG PO CAPS: 100.0000 mg | ORAL_CAPSULE | Freq: Two times a day (BID) | ORAL | Status: DC

## 2015-12-22 MED ORDER — HYDROCODONE-ACETAMINOPHEN 7.5-325 MG PO TABS: 1.0000 | ORAL_TABLET | ORAL | Status: DC | PRN

## 2015-12-22 MED ORDER — FERROUS SULFATE 325 (65 FE) MG PO TABS: 325.0000 mg | ORAL_TABLET | Freq: Three times a day (TID) | ORAL | Status: DC

## 2015-12-22 MED ORDER — HYDROMORPHONE HCL 1 MG/ML IJ SOLN
0.5000 mg | INTRAMUSCULAR | Status: DC | PRN
  Administered 2015-12-22: 0.5 mg via INTRAVENOUS
  Filled 2015-12-22: qty 1

## 2015-12-22 MED ORDER — AMLODIPINE BESYLATE 5 MG PO TABS
5.0000 mg | ORAL_TABLET | Freq: Every day | ORAL | Status: DC
  Administered 2015-12-23: 5 mg via ORAL
  Filled 2015-12-22: qty 1

## 2015-12-22 MED ORDER — ONDANSETRON HCL 4 MG/2ML IJ SOLN
INTRAMUSCULAR | Status: DC | PRN
  Administered 2015-12-22: 4 mg via INTRAVENOUS

## 2015-12-22 MED ORDER — FENTANYL CITRATE (PF) 100 MCG/2ML IJ SOLN
INTRAMUSCULAR | Status: AC
  Filled 2015-12-22: qty 2

## 2015-12-22 MED ORDER — LACTATED RINGERS IV SOLN
INTRAVENOUS | Status: DC
  Administered 2015-12-22: 1000 mL via INTRAVENOUS
  Administered 2015-12-22: 11:00:00 via INTRAVENOUS

## 2015-12-22 MED ORDER — MAGNESIUM CITRATE PO SOLN: 1.0000 | Freq: Once | ORAL | Status: DC | PRN

## 2015-12-22 MED ORDER — CHLORHEXIDINE GLUCONATE 4 % EX LIQD: 60.0000 mL | Freq: Once | CUTANEOUS | Status: DC

## 2015-12-22 MED ORDER — ONDANSETRON HCL 4 MG PO TABS: 4.0000 mg | ORAL_TABLET | Freq: Four times a day (QID) | ORAL | Status: DC | PRN

## 2015-12-22 MED ORDER — MIDAZOLAM HCL 2 MG/2ML IJ SOLN
INTRAMUSCULAR | Status: AC
  Filled 2015-12-22: qty 2

## 2015-12-22 MED ORDER — METHOCARBAMOL 500 MG PO TABS: 500.0000 mg | ORAL_TABLET | Freq: Four times a day (QID) | ORAL | Status: DC | PRN

## 2015-12-22 MED ORDER — METHOCARBAMOL 500 MG PO TABS
500.0000 mg | ORAL_TABLET | Freq: Four times a day (QID) | ORAL | Status: DC | PRN
  Administered 2015-12-23: 500 mg via ORAL
  Filled 2015-12-22: qty 1

## 2015-12-22 MED ORDER — DEXAMETHASONE SODIUM PHOSPHATE 10 MG/ML IJ SOLN
10.0000 mg | Freq: Once | INTRAMUSCULAR | Status: AC
  Administered 2015-12-23: 10 mg via INTRAVENOUS
  Filled 2015-12-22: qty 1

## 2015-12-22 MED ORDER — HYDROMORPHONE HCL 1 MG/ML IJ SOLN
0.2500 mg | INTRAMUSCULAR | Status: DC | PRN
  Administered 2015-12-22 (×2): 0.5 mg via INTRAVENOUS

## 2015-12-22 MED ORDER — CEFAZOLIN SODIUM-DEXTROSE 2-3 GM-% IV SOLR
2.0000 g | INTRAVENOUS | Status: AC
  Administered 2015-12-22: 2 g via INTRAVENOUS

## 2015-12-22 MED ORDER — ONDANSETRON HCL 4 MG/2ML IJ SOLN: 4.0000 mg | Freq: Four times a day (QID) | INTRAMUSCULAR | Status: DC | PRN

## 2015-12-22 MED ORDER — OMEPRAZOLE 20 MG PO CPDR
20.0000 mg | DELAYED_RELEASE_CAPSULE | Freq: Every day | ORAL | Status: DC
  Administered 2015-12-23: 20 mg via ORAL
  Filled 2015-12-22: qty 1

## 2015-12-22 MED ORDER — POLYETHYLENE GLYCOL 3350 17 G PO PACK: 17.0000 g | PACK | Freq: Two times a day (BID) | ORAL | Status: DC

## 2015-12-22 MED ORDER — ASPIRIN EC 325 MG PO TBEC
325.0000 mg | DELAYED_RELEASE_TABLET | Freq: Two times a day (BID) | ORAL | Status: DC
  Administered 2015-12-23: 325 mg via ORAL
  Filled 2015-12-22 (×3): qty 1

## 2015-12-22 MED ORDER — DEXTROSE 5 % IV SOLN
500.0000 mg | Freq: Four times a day (QID) | INTRAVENOUS | Status: DC | PRN
  Administered 2015-12-22: 500 mg via INTRAVENOUS
  Filled 2015-12-22 (×2): qty 5

## 2015-12-22 MED ORDER — DEXAMETHASONE SODIUM PHOSPHATE 10 MG/ML IJ SOLN
10.0000 mg | Freq: Once | INTRAMUSCULAR | Status: AC
  Administered 2015-12-22: 10 mg via INTRAVENOUS

## 2015-12-22 SURGICAL SUPPLY — 35 items
BAG DECANTER FOR FLEXI CONT (MISCELLANEOUS) IMPLANT
BAG SPEC THK2 15X12 ZIP CLS (MISCELLANEOUS)
BAG ZIPLOCK 12X15 (MISCELLANEOUS) IMPLANT
CAPT HIP TOTAL 2 ×2 IMPLANT
CLOTH BEACON ORANGE TIMEOUT ST (SAFETY) ×3 IMPLANT
COVER PERINEAL POST (MISCELLANEOUS) ×3 IMPLANT
DRAPE STERI IOBAN 125X83 (DRAPES) ×3 IMPLANT
DRAPE U-SHAPE 47X51 STRL (DRAPES) ×6 IMPLANT
DRSG AQUACEL AG ADV 3.5X10 (GAUZE/BANDAGES/DRESSINGS) ×3 IMPLANT
DURAPREP 26ML APPLICATOR (WOUND CARE) ×3 IMPLANT
ELECT REM PT RETURN 15FT ADLT (MISCELLANEOUS) IMPLANT
ELECT REM PT RETURN 9FT ADLT (ELECTROSURGICAL) ×3
ELECTRODE REM PT RTRN 9FT ADLT (ELECTROSURGICAL) ×1 IMPLANT
GLOVE BIOGEL M 7.0 STRL (GLOVE) IMPLANT
GLOVE BIOGEL PI IND STRL 7.5 (GLOVE) ×1 IMPLANT
GLOVE BIOGEL PI IND STRL 8.5 (GLOVE) ×1 IMPLANT
GLOVE BIOGEL PI INDICATOR 7.5 (GLOVE) ×2
GLOVE BIOGEL PI INDICATOR 8.5 (GLOVE) ×2
GLOVE ECLIPSE 8.0 STRL XLNG CF (GLOVE) ×6 IMPLANT
GLOVE ORTHO TXT STRL SZ7.5 (GLOVE) ×3 IMPLANT
GOWN STRL REUS W/TWL LRG LVL3 (GOWN DISPOSABLE) ×7 IMPLANT
GOWN STRL REUS W/TWL XL LVL3 (GOWN DISPOSABLE) ×5 IMPLANT
HOLDER FOLEY CATH W/STRAP (MISCELLANEOUS) ×3 IMPLANT
LIQUID BAND (GAUZE/BANDAGES/DRESSINGS) ×3 IMPLANT
PACK ANTERIOR HIP CUSTOM (KITS) ×3 IMPLANT
SAW OSC TIP CART 19.5X105X1.3 (SAW) ×3 IMPLANT
SUT MNCRL AB 4-0 PS2 18 (SUTURE) ×3 IMPLANT
SUT VIC AB 1 CT1 36 (SUTURE) ×9 IMPLANT
SUT VIC AB 2-0 CT1 27 (SUTURE) ×6
SUT VIC AB 2-0 CT1 TAPERPNT 27 (SUTURE) ×2 IMPLANT
SUT VLOC 180 0 24IN GS25 (SUTURE) ×3 IMPLANT
TRAY FOLEY W/METER SILVER 14FR (SET/KITS/TRAYS/PACK) IMPLANT
TRAY FOLEY W/METER SILVER 16FR (SET/KITS/TRAYS/PACK) ×2 IMPLANT
WATER STERILE IRR 1500ML POUR (IV SOLUTION) ×3 IMPLANT
YANKAUER SUCT BULB TIP 10FT TU (MISCELLANEOUS) IMPLANT

## 2015-12-22 NOTE — Progress Notes (Signed)
Portable AP Pelvis and Lateral Left Hip X-rays done. 

## 2015-12-22 NOTE — Anesthesia Preprocedure Evaluation (Addendum)
Anesthesia Evaluation  Patient identified by MRN, date of birth, ID band Patient awake    Reviewed: Allergy & Precautions, H&P , NPO status , Patient's Chart, lab work & pertinent test results  Airway Mallampati: II  TM Distance: >3 FB Neck ROM: full    Dental no notable dental hx. (+) Dental Advisory Given, Teeth Intact   Pulmonary neg pulmonary ROS,    Pulmonary exam normal breath sounds clear to auscultation       Cardiovascular Exercise Tolerance: Good hypertension, Pt. on medications Normal cardiovascular exam Rhythm:regular Rate:Normal     Neuro/Psych negative neurological ROS  negative psych ROS   GI/Hepatic Neg liver ROS, GERD  Medicated and Controlled,  Endo/Other  negative endocrine ROS  Renal/GU negative Renal ROS  negative genitourinary   Musculoskeletal   Abdominal   Peds  Hematology negative hematology ROS (+)   Anesthesia Other Findings   Reproductive/Obstetrics negative OB ROS                            Anesthesia Physical Anesthesia Plan  ASA: II  Anesthesia Plan: Spinal   Post-op Pain Management:    Induction:   Airway Management Planned:   Additional Equipment:   Intra-op Plan:   Post-operative Plan:   Informed Consent: I have reviewed the patients History and Physical, chart, labs and discussed the procedure including the risks, benefits and alternatives for the proposed anesthesia with the patient or authorized representative who has indicated his/her understanding and acceptance.   Dental Advisory Given  Plan Discussed with: CRNA and Surgeon  Anesthesia Plan Comments:        Anesthesia Quick Evaluation

## 2015-12-22 NOTE — Anesthesia Procedure Notes (Signed)
Spinal Patient location during procedure: OR Start time: 12/22/2015 10:00 AM End time: 12/22/2015 10:04 AM Staffing Anesthesiologist: EWELL, CHARLES Performed by: anesthesiologist  Preanesthetic Checklist Completed: patient identified, site marked, surgical consent, pre-op evaluation, timeout performed, IV checked, risks and benefits discussed and monitors and equipment checked Spinal Block Patient position: sitting Prep: Betadine Patient monitoring: heart rate, continuous pulse ox and blood pressure Approach: midline Location: L3-4 Injection technique: single-shot Needle Needle type: Sprotte and Whitacre  Needle gauge: 25 G Needle length: 9 cm Assessment Sensory level: T6 Additional Notes Expiration date of kit checked and confirmed. Patient tolerated procedure well, without complications.     

## 2015-12-22 NOTE — Evaluation (Signed)
Physical Therapy Evaluation Patient Details Name: Corey Logan MRN: [id number] DOB: 07-09-1956 Today's Date: 12/22/2015   History of Present Illness  [name]  Clinical Impression  The patient reports increased HA while ambulating, also thigh pain. Tolerated ambulation x 145'. Pt. Will benefit from PT to address problems listed in the note below to DC to home.    Follow Up Recommendations Home health PT;Supervision/Assistance - 24 hour    Equipment Recommendations  None recommended by PT    Recommendations for Other Services       Precautions / Restrictions Precautions Precautions: Fall Restrictions Weight Bearing Restrictions: No      Mobility  Bed Mobility Overal bed mobility: Needs Assistance Bed Mobility: Supine to Sit;Sit to Supine     Supine to sit: Min assist Sit to supine: Min assist   General bed mobility comments: assist with the  left Leg onto bed, cues for technique.  Transfers Overall transfer level: Needs assistance Equipment used: Rolling walker (2 wheeled) Transfers: Sit to/from Stand Sit to Stand: Min assist;From elevated surface         General transfer comment: cues for safety and technique  Ambulation/Gait Ambulation/Gait assistance: +2 safety/equipment Ambulation Distance (Feet): 145 Feet Assistive device: Rolling walker (2 wheeled) Gait Pattern/deviations: Step-through pattern     General Gait Details: minimal  antalgia, C/O HA WHILE AMBULATING, cues for sequence  Stairs            Wheelchair Mobility    Modified Rankin (Stroke Patients Only)       Balance                                             Pertinent Vitals/Pain Pain Assessment: 0-10 Pain Score: 7  Pain Location: L thigh, HA Pain Intervention(s): Limited activity within patient's tolerance;Monitored during session;Premedicated before session;Ice applied    Home Living Family/patient expects to be discharged to:: Private  residence Living Arrangements: Spouse/significant other Available Help at Discharge: Family Type of Home: House Home Access: Stairs to enter   Secretary/administrator of Steps: 1 Home Layout: One level Home Equipment: Environmental consultant - 2 wheels;Bedside commode      Prior Function Level of Independence: Independent               Hand Dominance        Extremity/Trunk Assessment   Upper Extremity Assessment: Defer to OT evaluation           Lower Extremity Assessment: LLE deficits/detail   LLE Deficits / Details: flexes the hip  in supine  Cervical / Trunk Assessment: Normal  Communication   Communication: No difficulties  Cognition Arousal/Alertness: Awake/alert Behavior During Therapy: WFL for tasks assessed/performed Overall Cognitive Status: Within Functional Limits for tasks assessed                      General Comments      Exercises Total Joint Exercises Heel Slides: AAROM;Left;Supine      Assessment/Plan    PT Assessment Patient needs continued PT services  PT Diagnosis Difficulty walking;Acute pain   PT Problem List Decreased strength;Decreased range of motion;Decreased activity tolerance;Decreased mobility;Decreased knowledge of precautions;Decreased safety awareness;Decreased knowledge of use of DME;Pain  PT Treatment Interventions DME instruction;Gait training;Stair training;Functional mobility training;Therapeutic activities;Therapeutic exercise;Patient/family education   PT Goals (Current goals can be found in the Care Plan section) Acute Rehab  PT Goals Patient Stated Goal: to go home tomorrow. PT Goal Formulation: With patient Time For Goal Achievement: 12/25/15 Potential to Achieve Goals: Good    Frequency 7X/week   Barriers to discharge        Co-evaluation               End of Session   Activity Tolerance: Patient limited by pain;Patient tolerated treatment well Patient left: in bed;with call bell/phone within  reach;with bed alarm set Nurse Communication: Mobility status         Time: [id number] PT Time Calculation (min) (ACUTE ONLY): 14 min   Charges:   PT Evaluation $PT Eval Low Complexity: 1 Procedure     PT G CodesRada Logan:        Corey Logan 12/22/2015, 4:15 PM Corey Logan PT [phone]

## 2015-12-22 NOTE — Op Note (Signed)
NAME:  Corey Logan                ACCOUNT NO.: [id number]      MEDICAL RECORD NO.: [id number]      FACILITY:  [facility]      PHYSICIAN:  Durene Romans D  DATE OF BIRTH:  1956/09/25     DATE OF PROCEDURE:  12/22/2015                                 OPERATIVE REPORT         PREOPERATIVE DIAGNOSIS: Left  hip osteoarthritis.      POSTOPERATIVE DIAGNOSIS:  Left hip osteoarthritis.      PROCEDURE:  Left total hip replacement through an anterior approach   utilizing DePuy THR system, component size 52mm pinnacle cup, a size 36+4 neutral   Altrex liner, a size 5 Hi Tri Lock stem with a 36+8.5 delta ceramic   ball.      SURGEON:  Madlyn Frankel. Charlann Boxer, M.D.      ASSISTANT:  Lanney Gins, PA-C      ANESTHESIA:  Spinal.      SPECIMENS:  None.      COMPLICATIONS:  None.      BLOOD LOSS:  275 cc     DRAINS:  None      INDICATION OF THE PROCEDURE:  Corey Logan is a 60 y.o. male who had   presented to office for evaluation of left hip pain.  Radiographs revealed   progressive degenerative changes with bone-on-bone   articulation to the  hip joint.  The patient had painful limited range of   motion significantly affecting their overall quality of life.  The patient was failing to    respond to conservative measures, and at this point was ready   to proceed with more definitive measures.  The patient has noted progressive   degenerative changes in his hip, progressive problems and dysfunction   with regarding the hip prior to surgery.  Consent was obtained for   benefit of pain relief.  Specific risk of infection, DVT, component   failure, dislocation, need for revision surgery, as well discussion of   the anterior versus posterior approach were reviewed.  Consent was   obtained for benefit of anterior pain relief through an anterior   approach.      PROCEDURE IN DETAIL:  The patient was brought to operative theater.   Once adequate anesthesia, preoperative  antibiotics, 2gm of Ancef, 1 gm of Tranexamic Acid, and 10 mg of Decadron administered.   The patient was positioned supine on the OSI Hanna table.  Once adequate   padding of boney process was carried out, we had predraped out the hip, and  used fluoroscopy to confirm orientation of the pelvis and position.      The left hip was then prepped and draped from proximal iliac crest to   mid thigh with shower curtain technique.      Time-out was performed identifying the patient, planned procedure, and   extremity.     An incision was then made 2 cm distal and lateral to the   anterior superior iliac spine extending over the orientation of the   tensor fascia lata muscle and sharp dissection was carried down to the   fascia of the muscle and protractor placed in the soft tissues.      The fascia  was then incised.  The muscle belly was identified and swept   laterally and retractor placed along the superior neck.  Following   cauterization of the circumflex vessels and removing some pericapsular   fat, a second cobra retractor was placed on the inferior neck.  A third   retractor was placed on the anterior acetabulum after elevating the   anterior rectus.  A L-capsulotomy was along the line of the   superior neck to the trochanteric fossa, then extended proximally and   distally.  Tag sutures were placed and the retractors were then placed   intracapsular.  We then identified the trochanteric fossa and   orientation of my neck cut, confirmed this radiographically   and then made a neck osteotomy with the femur on traction.  The femoral   head was removed without difficulty or complication.  Traction was let   off and retractors were placed posterior and anterior around the   acetabulum.      The labrum and foveal tissue were debrided.  I began reaming with a 47mm   reamer and reamed up to 51mm reamer with good bony bed preparation and a 52mm   cup was chosen.  The final 52mm Pinnacle cup  was then impacted under fluoroscopy  to confirm the depth of penetration and orientation with respect to   abduction.  A screw was placed followed by the hole eliminator.  The final   36+4 neutral Altrex liner was impacted with good visualized rim fit.  The cup was positioned anatomically within the acetabular portion of the pelvis.      At this point, the femur was rolled at 80 degrees.  Further capsule was   released off the inferior aspect of the femoral neck.  I then   released the superior capsule proximally.  The hook was placed laterally   along the femur and elevated manually and held in position with the bed   hook.  The leg was then extended and adducted with the leg rolled to 100   degrees of external rotation.  Once the proximal femur was fully   exposed, I used a box osteotome to set orientation.  I then began   broaching with the starting chili pepper broach and passed this by hand and then broached up to 5.  With the 5 broach in place I chose a high offset neck and did a trial reduction.  The offset was appropriate, leg lengths   appeared to be equal, confirmed radiographically.   Given these findings, I went ahead and dislocated the hip, repositioned all   retractors and positioned the right hip in the extended and abducted position.  The final 5 Hi Tri Lock stem was   chosen and it was impacted down to the level of neck cut.  Based on this   and the trial reduction, a 36+8.5 delta ceramic ball was chosen and   impacted onto a clean and dry trunnion, and the hip was reduced.  The   hip had been irrigated throughout the case again at this point.  I did   reapproximate the superior capsular leaflet to the anterior leaflet   using #1 Vicryl.  The fascia of the   tensor fascia lata muscle was then reapproximated using #1 Vicryl and #0 V-lock.  The   remaining wound was closed with 2-0 Vicryl and running 4-0 Monocryl.   The hip was cleaned, dried, and dressed sterilely using  Dermabond and  Aquacel dressing.  He was then brought   to recovery room in stable condition tolerating the procedure well.    Lanney Gins, PA-C was present for the entirety of the case involved from   preoperative positioning, perioperative retractor management, general   facilitation of the case, as well as primary wound closure as assistant.            Madlyn Frankel Charlann Boxer, M.D.        12/22/2015 11:20 AM

## 2015-12-22 NOTE — Interval H&P Note (Signed)
History and Physical Interval Note:  12/22/2015 8:49 AM  Spero CurbJames K Ipock  has presented today for surgery, with the diagnosis of left hip osteoarthritis  The various methods of treatment have been discussed with the patient and family. After consideration of risks, benefits and other options for treatment, the patient has consented to  Procedure(s): LEFT TOTAL HIP ARTHROPLASTY ANTERIOR APPROACH (Left) as a surgical intervention .  The patient's history has been reviewed, patient examined, no change in status, stable for surgery.  I have reviewed the patient's chart and labs.  Questions were answered to the patient's satisfaction.     Shelda PalLIN,Caralee Morea D

## 2015-12-22 NOTE — Progress Notes (Signed)
X-ray results noted 

## 2015-12-22 NOTE — Discharge Instructions (Signed)

## 2015-12-22 NOTE — Anesthesia Postprocedure Evaluation (Signed)
Anesthesia Post Note  Patient: Corey Logan  Procedure(s) Performed: Procedure(s) (LRB): LEFT TOTAL HIP ARTHROPLASTY ANTERIOR APPROACH (Left)  Patient location during evaluation: PACU Anesthesia Type: Spinal Level of consciousness: awake and alert Pain management: pain level controlled Vital Signs Assessment: post-procedure vital signs reviewed and stable Respiratory status: spontaneous breathing, nonlabored ventilation, respiratory function stable and patient connected to nasal cannula oxygen Cardiovascular status: blood pressure returned to baseline and stable Postop Assessment: no signs of nausea or vomiting Anesthetic complications: no    Last Vitals:  Filed Vitals:   12/22/15 1300 12/22/15 1312  BP: 140/80 141/74  Pulse: 72 75  Temp: 36.3 C 36.3 C  Resp: 12 14    Last Pain:  Filed Vitals:   12/22/15 1331  PainSc: 5                  Myrikal Messmer L

## 2015-12-22 NOTE — Transfer of Care (Signed)
Immediate Anesthesia Transfer of Care Note  Patient: Corey CurbJames K Corti  Procedure(s) Performed: Procedure(s): LEFT TOTAL HIP ARTHROPLASTY ANTERIOR APPROACH (Left)  Patient Location: PACU  Anesthesia Type:Spinal  Level of Consciousness:  alert, patient cooperative and responds to stimulation  Airway & Oxygen Therapy:Patient Spontanous Breathing and Patient connected to face mask oxgen  Post-op Assessment:  Report given to PACU RN and Post -op Vital signs reviewed and stable  Post vital signs:  Reviewed and stable, spinal L1 Last Vitals:  Filed Vitals:   12/22/15 0758  BP: 136/96  Pulse: 98  Temp: 36.7 C  Resp: 18    Complications: No apparent anesthesia complications

## 2015-12-23 DIAGNOSIS — E663 Overweight: Secondary | ICD-10-CM | POA: Diagnosis present

## 2015-12-23 LAB — BASIC METABOLIC PANEL
ANION GAP: 7 (ref 5–15)
BUN: 15 mg/dL (ref 6–20)
CALCIUM: 8.7 mg/dL — AB (ref 8.9–10.3)
CO2: 25 mmol/L (ref 22–32)
Chloride: 107 mmol/L (ref 101–111)
Creatinine, Ser: 0.94 mg/dL (ref 0.61–1.24)
Glucose, Bld: 171 mg/dL — ABNORMAL HIGH (ref 65–99)
POTASSIUM: 4.2 mmol/L (ref 3.5–5.1)
SODIUM: 139 mmol/L (ref 135–145)

## 2015-12-23 LAB — CBC
HCT: 37.3 % — ABNORMAL LOW (ref 39.0–52.0)
HEMOGLOBIN: 13.1 g/dL (ref 13.0–17.0)
MCH: 31.8 pg (ref 26.0–34.0)
MCHC: 35.1 g/dL (ref 30.0–36.0)
MCV: 90.5 fL (ref 78.0–100.0)
PLATELETS: 221 10*3/uL (ref 150–400)
RBC: 4.12 MIL/uL — AB (ref 4.22–5.81)
RDW: 12.2 % (ref 11.5–15.5)
WBC: 9.7 10*3/uL (ref 4.0–10.5)

## 2015-12-23 NOTE — Progress Notes (Signed)
Physical Therapy Treatment Patient Details Name: Corey CurbJames K Logan MRN: [id number] DOB: [birth year] Today's Date: 12/23/2015    History of Present Illness [name]    PT Comments    POD # 1 pt progressing well and eager to D/C to home.   Assisted with amb in hallway, practiced one step twice and performed THR TE's following HEP handout.  Instructed on proper tech and freq as well as use of ICE.  All mobility questions addressed.  Pt ready for D/C to home.  Follow Up Recommendations  Home health PT;Supervision/Assistance - 24 hour     Equipment Recommendations  None recommended by PT    Recommendations for Other Services       Precautions / Restrictions Precautions Precautions: Fall Restrictions Weight Bearing Restrictions: No LLE Weight Bearing: Weight bearing as tolerated    Mobility  Bed Mobility               General bed mobility comments: OOB in recliner  Transfers Overall transfer level: Needs assistance Equipment used: Rolling walker (2 wheeled) Transfers: Sit to/from Stand Sit to Stand: Supervision         General transfer comment: < 25% cues for safety and technique  Ambulation/Gait Ambulation/Gait assistance: Supervision Ambulation Distance (Feet): 125 Feet Assistive device: Rolling walker (2 wheeled) Gait Pattern/deviations: Step-through pattern Gait velocity: WFL   General Gait Details: < 25% VC's safety with turns and negociating around obsticles   Stairs Stairs: Yes Stairs assistance: Supervision Stair Management: No rails;Step to pattern;Forwards;With walker Number of Stairs: 1 General stair comments: performed one step twice with spouse present.  Instructed on safety and proper walker placement.   Wheelchair Mobility    Modified Rankin (Stroke Patients Only)       Balance                                    Cognition Arousal/Alertness: Awake/alert Behavior During Therapy: WFL for tasks assessed/performed Overall  Cognitive Status: Within Functional Limits for tasks assessed                      Exercises   Total Hip Replacement TE's 10 reps ankle pumps 10 reps knee presses 10 reps heel slides 10 reps SAQ's 10 reps ABD Followed by ICE    General Comments        Pertinent Vitals/Pain Pain Assessment: 0-10 Pain Score: 4  Pain Location: L hip Pain Descriptors / Indicators: Aching;Sore;Tightness Pain Intervention(s): Monitored during session;Premedicated before session;Repositioned;Ice applied    Home Living                      Prior Function            PT Goals (current goals can now be found in the care plan section) Progress towards PT goals: Progressing toward goals    Frequency  7X/week    PT Plan      Co-evaluation             End of Session Equipment Utilized During Treatment: Gait belt Activity Tolerance: Patient tolerated treatment well Patient left: in chair;with call bell/phone within reach;with family/visitor present     Time: 0945-1010 PT Time Calculation (min) (ACUTE ONLY): 25 min  Charges:  $Gait Training: 8-22 mins $Therapeutic Exercise: 8-22 mins  G Codes:      Rica Koyanagi  PTA [facility]  Acute  Rehab Pager      [phone]

## 2015-12-23 NOTE — Progress Notes (Signed)
     Subjective: 1 Day Post-Op Procedure(s) (LRB): LEFT TOTAL HIP ARTHROPLASTY ANTERIOR APPROACH (Left)   Patient reports pain as mild, pain well controlled. No events throughout the night.  Ready to be discharged home.  Objective:   VITALS:   Filed Vitals:   12/23/15 0539 12/23/15 0655  BP: 140/76   Pulse: 76 78  Temp: 98.5 F (36.9 C)   Resp: 16 18    Dorsiflexion/Plantar flexion intact Incision: dressing C/D/I No cellulitis present Compartment soft  LABS  Recent Labs  12/23/15 0437  HGB 13.1  HCT 37.3*  WBC 9.7  PLT 221     Recent Labs  12/23/15 0437  NA 139  K 4.2  BUN 15  CREATININE 0.94  GLUCOSE 171*     Assessment/Plan: 1 Day Post-Op Procedure(s) (LRB): LEFT TOTAL HIP ARTHROPLASTY ANTERIOR APPROACH (Left) Foley cath d/c'ed Advance diet Up with therapy D/C IV fluids Discharge home with home health [facility]. Follow up with OLIN,Laqueshia Cihlar D in 2 weeks.  Contact information:  [facility] [address], [address] [place] [id number] [phone]    Overweight (BMI 25-29.9) Estimated body mass index is 29.7 kg/(m^2) as calculated from the following:   Height as of this encounter: 5\' 10"  (1.778 m).   Weight as of this encounter: 93.895 kg (207 lb). Patient also counseled that weight may inhibit the healing process Patient counseled that losing weight will help with future health issues            Anastasio AuerbachMatthew S. Kiel Cockerell   PAC  12/23/2015, 7:58 AM
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
## 2015-12-23 NOTE — Progress Notes (Signed)
Occupational Therapy Evaluation Patient Details Name: Corey Logan MRN: [id number] DOB: 1956-07-08 Today's Date: 12/23/2015    History of Present Illness [name]   Clinical Impression   All OT education completed and pt questions answered. Patient will d/c home with assistance from his wife.     Follow Up Recommendations  No OT follow up;Supervision/Assistance - 24 hour    Equipment Recommendations  None recommended by OT    Recommendations for Other Services       Precautions / Restrictions Precautions Precautions: Fall Restrictions Weight Bearing Restrictions: No LLE Weight Bearing: Weight bearing as tolerated      Mobility Bed Mobility                  Transfers Overall transfer level: Needs assistance Equipment used: Rolling walker (2 wheeled) Transfers: Sit to/from Stand Sit to Stand: Supervision              Balance                                            ADL Overall ADL's : Needs assistance/impaired Eating/Feeding: Independent;Sitting   Grooming: Wash/dry hands;Supervision/safety;Standing   Upper Body Bathing: Set up;Sitting   Lower Body Bathing: Minimal assistance;Sit to/from stand   Upper Body Dressing : Set up;Sitting   Lower Body Dressing: Minimal assistance;Sit to/from stand   Toilet Transfer: Supervision/safety;Ambulation;BSC;RW   Toileting- Clothing Manipulation and Hygiene: Supervision/safety;Sit to/from stand       Functional mobility during ADLs: Supervision/safety;Rolling walker General ADL Comments: Patient reports he needs to toilet. Ambulated to bathroom with RW and patient performed toileting task. While in bathroom, educated patient on walk-in shower transfer. Patient verbalized understanding but did not wish to practice. Reviewed technique with patient's wife as well. Patient ambulated in hall with RW per his request, then back to recliner. Educated on LB dressing techniques with reacher and  shoe horn, which he has borrowed from a friend.      Vision     Perception     Praxis      Pertinent Vitals/Pain Pain Assessment: 0-10 Pain Score: 3  Pain Location: L hip Pain Descriptors / Indicators: Aching;Sore Pain Intervention(s): Monitored during session;Repositioned     Hand Dominance     Extremity/Trunk Assessment Upper Extremity Assessment Upper Extremity Assessment: Overall WFL for tasks assessed   Lower Extremity Assessment Lower Extremity Assessment: Defer to PT evaluation       Communication Communication Communication: No difficulties   Cognition Arousal/Alertness: Awake/alert Behavior During Therapy: WFL for tasks assessed/performed Overall Cognitive Status: Within Functional Limits for tasks assessed                     General Comments       Exercises       Shoulder Instructions      Home Living Family/patient expects to be discharged to:: Private residence Living Arrangements: Spouse/significant other Available Help at Discharge: Family Type of Home: House Home Access: Stairs to enter Secretary/administratorntrance Stairs-Number of Steps: 1   Home Layout: One level     Bathroom Shower/Tub: Producer, television/film/videoWalk-in shower   Bathroom Toilet: Standard Bathroom Accessibility: Yes How Accessible: Accessible via walker Home Equipment: Walker - 2 wheels;Bedside commode;Adaptive equipment Adaptive Equipment: Reacher;Long-handled shoe horn        Prior Functioning/Environment Level of Independence: Independent  OT Diagnosis: Acute pain   OT Problem List: Decreased strength;Decreased range of motion;Decreased activity tolerance;Decreased knowledge of use of DME or AE;Pain   OT Treatment/Interventions:      OT Goals(Current goals can be found in the care plan section) Acute Rehab OT Goals Patient Stated Goal: home today OT Goal Formulation: All assessment and education complete, DC therapy  OT Frequency:     Barriers to D/C:             Co-evaluation              End of Session Equipment Utilized During Treatment: Rolling walker Nurse Communication: Mobility status  Activity Tolerance: Patient tolerated treatment well Patient left: in chair;with call bell/phone within reach;with family/visitor present   Time: 8119-1478 OT Time Calculation (min): 16 min Charges:  OT General Charges $OT Visit: 1 Procedure OT Evaluation $OT Eval Low Complexity: 1 Procedure G-Codes:    Hugh Kamara A 01-04-16, 12:04 PM

## 2015-12-23 NOTE — Care Management Note (Signed)
Case Management Note  Patient Details  Name: BRYCETON HANTZ MRN: [id number] Date of Birth: Jul 30, 1956  Subjective/Objective:                  LEFT TOTAL HIP ARTHROPLASTY ANTERIOR APPROACH (Left) Action/Plan: Discharge planning Expected Discharge Date:  12/23/15               Expected Discharge Plan:  [place]  In-House Referral:     Discharge planning Services  CM Consult  Post Acute Care Choice:  Home Health Choice offered to:  Patient  DME Arranged:  N/A DME Agency:  NA  HH Arranged:  Patient Refused [place] Agency:  NA  Status of Service:     Medicare Important Message Given:    Date Medicare IM Given:    Medicare IM give by:    Date Additional Medicare IM Given:    Additional Medicare Important Message give by:     If discussed at [place] of Stay Meetings, dates discussed:    Additional Comments: CM met with pt who refuses all [place] services. Pt states he has all the DME he needs at home.  No other CM needs were communicated. Dellie Catholic, RN 12/23/2015, 1:16 PM

## 2019-03-26 DIAGNOSIS — M25512 Pain in left shoulder: Secondary | ICD-10-CM | POA: Diagnosis not present

## 2019-03-26 DIAGNOSIS — M25522 Pain in left elbow: Secondary | ICD-10-CM | POA: Diagnosis not present

## 2019-07-20 DIAGNOSIS — H6123 Impacted cerumen, bilateral: Secondary | ICD-10-CM | POA: Diagnosis not present

## 2019-08-04 ENCOUNTER — Encounter (HOSPITAL_COMMUNITY): Payer: Self-pay | Admitting: Emergency Medicine

## 2019-08-04 ENCOUNTER — Emergency Department (HOSPITAL_COMMUNITY): Payer: PPO

## 2019-08-04 ENCOUNTER — Inpatient Hospital Stay (HOSPITAL_COMMUNITY)
Admission: EM | Admit: 2019-08-04 | Discharge: 2019-08-11 | DRG: 445 | Disposition: A | Discharge status: Home / Self Care | Payer: PPO | Attending: Internal Medicine | Admitting: Internal Medicine

## 2019-08-04 ENCOUNTER — Other Ambulatory Visit: Payer: Self-pay

## 2019-08-04 DIAGNOSIS — R188 Other ascites: Secondary | ICD-10-CM | POA: Diagnosis present

## 2019-08-04 DIAGNOSIS — K224 Dyskinesia of esophagus: Secondary | ICD-10-CM | POA: Diagnosis present

## 2019-08-04 DIAGNOSIS — R1011 Right upper quadrant pain: Secondary | ICD-10-CM | POA: Diagnosis not present

## 2019-08-04 DIAGNOSIS — J9811 Atelectasis: Secondary | ICD-10-CM | POA: Diagnosis not present

## 2019-08-04 DIAGNOSIS — F1020 Alcohol dependence, uncomplicated: Secondary | ICD-10-CM | POA: Diagnosis present

## 2019-08-04 DIAGNOSIS — R630 Anorexia: Secondary | ICD-10-CM | POA: Diagnosis present

## 2019-08-04 DIAGNOSIS — R1010 Upper abdominal pain, unspecified: Secondary | ICD-10-CM | POA: Diagnosis not present

## 2019-08-04 DIAGNOSIS — R197 Diarrhea, unspecified: Secondary | ICD-10-CM | POA: Diagnosis not present

## 2019-08-04 DIAGNOSIS — Z9049 Acquired absence of other specified parts of digestive tract: Secondary | ICD-10-CM

## 2019-08-04 DIAGNOSIS — E876 Hypokalemia: Secondary | ICD-10-CM | POA: Diagnosis not present

## 2019-08-04 DIAGNOSIS — K828 Other specified diseases of gallbladder: Secondary | ICD-10-CM | POA: Diagnosis not present

## 2019-08-04 DIAGNOSIS — F329 Major depressive disorder, single episode, unspecified: Secondary | ICD-10-CM | POA: Diagnosis present

## 2019-08-04 DIAGNOSIS — R1110 Vomiting, unspecified: Secondary | ICD-10-CM | POA: Diagnosis present

## 2019-08-04 DIAGNOSIS — R17 Unspecified jaundice: Secondary | ICD-10-CM | POA: Diagnosis not present

## 2019-08-04 DIAGNOSIS — E860 Dehydration: Secondary | ICD-10-CM | POA: Diagnosis present

## 2019-08-04 DIAGNOSIS — G8929 Other chronic pain: Secondary | ICD-10-CM | POA: Diagnosis not present

## 2019-08-04 DIAGNOSIS — Z6827 Body mass index (BMI) 27.0-27.9, adult: Secondary | ICD-10-CM | POA: Diagnosis not present

## 2019-08-04 DIAGNOSIS — K449 Diaphragmatic hernia without obstruction or gangrene: Secondary | ICD-10-CM | POA: Diagnosis present

## 2019-08-04 DIAGNOSIS — Z96642 Presence of left artificial hip joint: Secondary | ICD-10-CM | POA: Diagnosis not present

## 2019-08-04 DIAGNOSIS — N179 Acute kidney failure, unspecified: Secondary | ICD-10-CM | POA: Diagnosis not present

## 2019-08-04 DIAGNOSIS — Z79899 Other long term (current) drug therapy: Secondary | ICD-10-CM | POA: Diagnosis not present

## 2019-08-04 DIAGNOSIS — I10 Essential (primary) hypertension: Secondary | ICD-10-CM | POA: Diagnosis present

## 2019-08-04 DIAGNOSIS — K219 Gastro-esophageal reflux disease without esophagitis: Secondary | ICD-10-CM | POA: Diagnosis not present

## 2019-08-04 DIAGNOSIS — R109 Unspecified abdominal pain: Secondary | ICD-10-CM

## 2019-08-04 DIAGNOSIS — R112 Nausea with vomiting, unspecified: Secondary | ICD-10-CM | POA: Diagnosis not present

## 2019-08-04 DIAGNOSIS — R400 Somnolence: Secondary | ICD-10-CM | POA: Diagnosis not present

## 2019-08-04 DIAGNOSIS — E512 Wernicke's encephalopathy: Secondary | ICD-10-CM | POA: Diagnosis not present

## 2019-08-04 DIAGNOSIS — R7989 Other specified abnormal findings of blood chemistry: Secondary | ICD-10-CM | POA: Diagnosis not present

## 2019-08-04 DIAGNOSIS — R0902 Hypoxemia: Secondary | ICD-10-CM | POA: Diagnosis not present

## 2019-08-04 DIAGNOSIS — R0602 Shortness of breath: Secondary | ICD-10-CM | POA: Diagnosis not present

## 2019-08-04 DIAGNOSIS — R1084 Generalized abdominal pain: Secondary | ICD-10-CM | POA: Diagnosis not present

## 2019-08-04 DIAGNOSIS — R509 Fever, unspecified: Secondary | ICD-10-CM | POA: Diagnosis not present

## 2019-08-04 DIAGNOSIS — R079 Chest pain, unspecified: Secondary | ICD-10-CM | POA: Diagnosis not present

## 2019-08-04 DIAGNOSIS — R4182 Altered mental status, unspecified: Secondary | ICD-10-CM | POA: Diagnosis not present

## 2019-08-04 DIAGNOSIS — F10231 Alcohol dependence with withdrawal delirium: Secondary | ICD-10-CM | POA: Diagnosis not present

## 2019-08-04 DIAGNOSIS — R457 State of emotional shock and stress, unspecified: Secondary | ICD-10-CM | POA: Diagnosis not present

## 2019-08-04 DIAGNOSIS — I159 Secondary hypertension, unspecified: Secondary | ICD-10-CM | POA: Diagnosis not present

## 2019-08-04 DIAGNOSIS — F10931 Alcohol use, unspecified with withdrawal delirium: Secondary | ICD-10-CM | POA: Diagnosis not present

## 2019-08-04 DIAGNOSIS — Z20828 Contact with and (suspected) exposure to other viral communicable diseases: Secondary | ICD-10-CM | POA: Diagnosis present

## 2019-08-04 DIAGNOSIS — R52 Pain, unspecified: Secondary | ICD-10-CM | POA: Diagnosis not present

## 2019-08-04 DIAGNOSIS — S069X9A Unspecified intracranial injury with loss of consciousness of unspecified duration, initial encounter: Secondary | ICD-10-CM | POA: Diagnosis not present

## 2019-08-04 LAB — CBC WITH DIFFERENTIAL/PLATELET
Abs Immature Granulocytes: 0.08 10*3/uL — ABNORMAL HIGH (ref 0.00–0.07)
Basophils Absolute: 0.1 10*3/uL (ref 0.0–0.1)
Basophils Relative: 0 %
Eosinophils Absolute: 0.2 10*3/uL (ref 0.0–0.5)
Eosinophils Relative: 2 %
HCT: 45 % (ref 39.0–52.0)
Hemoglobin: 15.2 g/dL (ref 13.0–17.0)
Immature Granulocytes: 1 %
Lymphocytes Relative: 16 %
Lymphs Abs: 2.5 10*3/uL (ref 0.7–4.0)
MCH: 31.7 pg (ref 26.0–34.0)
MCHC: 33.8 g/dL (ref 30.0–36.0)
MCV: 93.9 fL (ref 80.0–100.0)
Monocytes Absolute: 0.6 10*3/uL (ref 0.1–1.0)
Monocytes Relative: 4 %
Neutro Abs: 12.2 10*3/uL — ABNORMAL HIGH (ref 1.7–7.7)
Neutrophils Relative %: 77 %
Platelets: 285 10*3/uL (ref 150–400)
RBC: 4.79 MIL/uL (ref 4.22–5.81)
RDW: 11.9 % (ref 11.5–15.5)
WBC: 15.7 10*3/uL — ABNORMAL HIGH (ref 4.0–10.5)
nRBC: 0 % (ref 0.0–0.2)

## 2019-08-04 LAB — URINALYSIS, ROUTINE W REFLEX MICROSCOPIC
Bilirubin Urine: NEGATIVE
Glucose, UA: NEGATIVE mg/dL
Hgb urine dipstick: NEGATIVE
Ketones, ur: 5 mg/dL — AB
Leukocytes,Ua: NEGATIVE
Nitrite: NEGATIVE
Protein, ur: NEGATIVE mg/dL
Specific Gravity, Urine: 1.02 (ref 1.005–1.030)
pH: 5 (ref 5.0–8.0)

## 2019-08-04 LAB — COMPREHENSIVE METABOLIC PANEL
ALT: 20 U/L (ref 0–44)
AST: 19 U/L (ref 15–41)
Albumin: 4.2 g/dL (ref 3.5–5.0)
Alkaline Phosphatase: 55 U/L (ref 38–126)
Anion gap: 9 (ref 5–15)
BUN: 21 mg/dL (ref 8–23)
CO2: 24 mmol/L (ref 22–32)
Calcium: 8.7 mg/dL — ABNORMAL LOW (ref 8.9–10.3)
Chloride: 107 mmol/L (ref 98–111)
Creatinine, Ser: 1.34 mg/dL — ABNORMAL HIGH (ref 0.61–1.24)
GFR calc Af Amer: >60 mL/min (ref >60)
GFR calc non Af Amer: 56 mL/min — ABNORMAL LOW (ref >60)
Glucose, Bld: 190 mg/dL — ABNORMAL HIGH (ref 70–99)
Potassium: 3.6 mmol/L (ref 3.5–5.1)
Sodium: 140 mmol/L (ref 135–145)
Total Bilirubin: 0.8 mg/dL (ref 0.3–1.2)
Total Protein: 7.2 g/dL (ref 6.5–8.1)

## 2019-08-04 LAB — SARS CORONAVIRUS 2 BY RT PCR (HOSPITAL ORDER, PERFORMED IN ~~LOC~~ HOSPITAL LAB): SARS Coronavirus 2: NEGATIVE

## 2019-08-04 LAB — TROPONIN I (HIGH SENSITIVITY)
Troponin I (High Sensitivity): <2 ng/L (ref <18)
Troponin I (High Sensitivity): <2 ng/L (ref <18)

## 2019-08-04 MED ORDER — HYDROMORPHONE HCL 1 MG/ML IJ SOLN
1.0000 mg | INTRAMUSCULAR | Status: DC | PRN
  Administered 2019-08-04: 1 mg via INTRAVENOUS
  Filled 2019-08-04: qty 1

## 2019-08-04 MED ORDER — ONDANSETRON HCL 4 MG/2ML IJ SOLN
4.0000 mg | Freq: Once | INTRAMUSCULAR | Status: AC
  Administered 2019-08-04: 4 mg via INTRAVENOUS
  Filled 2019-08-04: qty 2

## 2019-08-04 MED ORDER — LORAZEPAM 2 MG/ML IJ SOLN
1.0000 mg | INTRAMUSCULAR | Status: DC | PRN
  Administered 2019-08-06: 1 mg via INTRAVENOUS
  Administered 2019-08-06 – 2019-08-07 (×7): 2 mg via INTRAVENOUS
  Filled 2019-08-04 (×8): qty 1

## 2019-08-04 MED ORDER — SODIUM CHLORIDE 0.9 % IV SOLN
2.0000 g | Freq: Once | INTRAVENOUS | Status: AC
  Administered 2019-08-04: 2 g via INTRAVENOUS
  Filled 2019-08-04: qty 20

## 2019-08-04 MED ORDER — METRONIDAZOLE IN NACL 5-0.79 MG/ML-% IV SOLN
500.0000 mg | Freq: Three times a day (TID) | INTRAVENOUS | Status: DC
  Administered 2019-08-04 – 2019-08-11 (×20): 500 mg via INTRAVENOUS
  Filled 2019-08-04 (×19): qty 100

## 2019-08-04 MED ORDER — HYDRALAZINE HCL 20 MG/ML IJ SOLN: 5.0000 mg | INTRAMUSCULAR | Status: DC | PRN

## 2019-08-04 MED ORDER — ONDANSETRON HCL 4 MG/2ML IJ SOLN
4.0000 mg | Freq: Four times a day (QID) | INTRAMUSCULAR | Status: DC | PRN
  Administered 2019-08-05 – 2019-08-07 (×5): 4 mg via INTRAVENOUS
  Filled 2019-08-04 (×5): qty 2

## 2019-08-04 MED ORDER — SODIUM CHLORIDE 0.9 % IV SOLN
2.0000 g | INTRAVENOUS | Status: DC
  Administered 2019-08-05 – 2019-08-10 (×6): 2 g via INTRAVENOUS
  Filled 2019-08-04 (×6): qty 2

## 2019-08-04 MED ORDER — ACETAMINOPHEN 325 MG PO TABS
650.0000 mg | ORAL_TABLET | Freq: Four times a day (QID) | ORAL | Status: DC | PRN
  Filled 2019-08-04: qty 2

## 2019-08-04 MED ORDER — FOLIC ACID 1 MG PO TABS
1.0000 mg | ORAL_TABLET | Freq: Every day | ORAL | Status: DC
  Administered 2019-08-06 – 2019-08-11 (×4): 1 mg via ORAL
  Filled 2019-08-04 (×4): qty 1

## 2019-08-04 MED ORDER — HYDROMORPHONE HCL 1 MG/ML IJ SOLN
1.0000 mg | Freq: Once | INTRAMUSCULAR | Status: AC
  Administered 2019-08-04: 1 mg via INTRAVENOUS
  Filled 2019-08-04: qty 1

## 2019-08-04 MED ORDER — PROMETHAZINE HCL 25 MG/ML IJ SOLN
12.5000 mg | Freq: Four times a day (QID) | INTRAMUSCULAR | Status: DC | PRN
  Administered 2019-08-04 – 2019-08-08 (×4): 12.5 mg via INTRAVENOUS
  Filled 2019-08-04 (×5): qty 1

## 2019-08-04 MED ORDER — HYDROMORPHONE HCL 1 MG/ML IJ SOLN
1.0000 mg | INTRAMUSCULAR | Status: DC | PRN
  Administered 2019-08-05 – 2019-08-06 (×8): 1 mg via INTRAVENOUS
  Filled 2019-08-04 (×8): qty 1

## 2019-08-04 MED ORDER — ACETAMINOPHEN 650 MG RE SUPP: 650.0000 mg | Freq: Four times a day (QID) | RECTAL | Status: DC | PRN

## 2019-08-04 MED ORDER — HYDROMORPHONE HCL 1 MG/ML IJ SOLN
0.5000 mg | Freq: Once | INTRAMUSCULAR | Status: AC
  Administered 2019-08-04: 0.5 mg via INTRAVENOUS
  Filled 2019-08-04: qty 1

## 2019-08-04 MED ORDER — KETOROLAC TROMETHAMINE 15 MG/ML IJ SOLN
15.0000 mg | Freq: Four times a day (QID) | INTRAMUSCULAR | Status: AC
  Administered 2019-08-04 – 2019-08-05 (×4): 15 mg via INTRAVENOUS
  Filled 2019-08-04 (×4): qty 1

## 2019-08-04 MED ORDER — VITAMIN B-1 100 MG PO TABS: 100.0000 mg | ORAL_TABLET | Freq: Every day | ORAL | Status: DC

## 2019-08-04 MED ORDER — LORAZEPAM 1 MG PO TABS: 1.0000 mg | ORAL_TABLET | ORAL | Status: DC | PRN

## 2019-08-04 MED ORDER — SODIUM CHLORIDE 0.9 % IV SOLN
INTRAVENOUS | Status: AC
  Administered 2019-08-04: 23:00:00 via INTRAVENOUS

## 2019-08-04 MED ORDER — THIAMINE HCL 100 MG/ML IJ SOLN
100.0000 mg | Freq: Every day | INTRAMUSCULAR | Status: DC
  Administered 2019-08-04 – 2019-08-08 (×3): 100 mg via INTRAVENOUS
  Filled 2019-08-04 (×3): qty 2

## 2019-08-04 MED ORDER — ADULT MULTIVITAMIN W/MINERALS CH
1.0000 | ORAL_TABLET | Freq: Every day | ORAL | Status: DC
  Administered 2019-08-06 – 2019-08-11 (×4): 1 via ORAL
  Filled 2019-08-04 (×4): qty 1

## 2019-08-04 MED ORDER — SODIUM CHLORIDE 0.9 % IV BOLUS
1000.0000 mL | Freq: Once | INTRAVENOUS | Status: AC
  Administered 2019-08-04: 1000 mL via INTRAVENOUS

## 2019-08-04 NOTE — ED Notes (Signed)
Contacted [facility] after charge nurse let me know there is one ordered, we can't see the order as a consult so they would have to notify us,
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
## 2019-08-05 ENCOUNTER — Inpatient Hospital Stay (HOSPITAL_COMMUNITY): Payer: PPO

## 2019-08-05 LAB — BASIC METABOLIC PANEL
Anion gap: 13 (ref 5–15)
BUN: 26 mg/dL — ABNORMAL HIGH (ref 8–23)
CO2: 20 mmol/L — ABNORMAL LOW (ref 22–32)
Calcium: 8.5 mg/dL — ABNORMAL LOW (ref 8.9–10.3)
Chloride: 108 mmol/L (ref 98–111)
Creatinine, Ser: 1.22 mg/dL (ref 0.61–1.24)
GFR calc Af Amer: >60 mL/min (ref >60)
GFR calc non Af Amer: >60 mL/min (ref >60)
Glucose, Bld: 167 mg/dL — ABNORMAL HIGH (ref 70–99)
Potassium: 4.3 mmol/L (ref 3.5–5.1)
Sodium: 141 mmol/L (ref 135–145)

## 2019-08-05 LAB — CBC
HCT: 48.6 % (ref 39.0–52.0)
Hemoglobin: 16.7 g/dL (ref 13.0–17.0)
MCH: 32.1 pg (ref 26.0–34.0)
MCHC: 34.4 g/dL (ref 30.0–36.0)
MCV: 93.3 fL (ref 80.0–100.0)
Platelets: 325 10*3/uL (ref 150–400)
RBC: 5.21 MIL/uL (ref 4.22–5.81)
RDW: 12.4 % (ref 11.5–15.5)
WBC: 9.1 10*3/uL (ref 4.0–10.5)
nRBC: 0 % (ref 0.0–0.2)

## 2019-08-05 LAB — HIV ANTIBODY (ROUTINE TESTING W REFLEX): HIV Screen 4th Generation wRfx: NONREACTIVE

## 2019-08-05 LAB — MAGNESIUM: Magnesium: 1.8 mg/dL (ref 1.7–2.4)

## 2019-08-05 LAB — PHOSPHORUS: Phosphorus: 2.7 mg/dL (ref 2.5–4.6)

## 2019-08-05 MED ORDER — OMEPRAZOLE 20 MG PO CPDR
20.0000 mg | DELAYED_RELEASE_CAPSULE | Freq: Every day | ORAL | Status: DC
  Administered 2019-08-06: 20 mg via ORAL
  Filled 2019-08-05 (×3): qty 1

## 2019-08-05 MED ORDER — SODIUM CHLORIDE 0.9 % IV SOLN
INTRAVENOUS | Status: DC | PRN
  Administered 2019-08-05: 250 mL via INTRAVENOUS

## 2019-08-05 MED ORDER — OMEPRAZOLE MAGNESIUM 20 MG PO TBEC: 20.0000 mg | DELAYED_RELEASE_TABLET | Freq: Every day | ORAL | Status: DC

## 2019-08-05 MED ORDER — TECHNETIUM TC 99M MEBROFENIN IV KIT
5.2000 | PACK | Freq: Once | INTRAVENOUS | Status: AC | PRN
  Administered 2019-08-05: 5.2 via INTRAVENOUS

## 2019-08-05 NOTE — Progress Notes (Signed)
Pt sleeping  Spouse at bedside

## 2019-08-05 NOTE — Progress Notes (Signed)
Writer has paged [facility] for Hida scan time. Eulas Post, of [facility], stated that the MD nor night nurse has made him aware of pt needing Hida scan. Hida scan orders state that pt must be NPO for 6 hours, as well as without narcotics for 6 hours prior to scan. Pt received narcotic this morning. Writer has paged MD to see if Hida scan is STAT. If not, the scan will be tomorrow, Monday, August 06, 2019. Have received return call from surgeon. The Hida scan will be tomorrow morning. Will alert Eulas Post of [facility] at this time. Will alert pt and his spouse (at bedside).
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
## 2019-08-05 NOTE — Progress Notes (Signed)
PROGRESS NOTE    Corey Logan  IDP:[id number] DOB: 1955-12-03 DOA: 08/04/2019 PCP: [name], MD   Brief Narrative:  Corey Logan is a 63 y.o. male with medical history significant of arthritis, depression, GERD, hiatal hernia, hypertension presenting with a chief complaint of right upper quadrant abdominal pain.  History limited as patient appears uncomfortable due to pain.  Reports having acute onset right upper quadrant abdominal pain this afternoon.  The pain has been constant since then and radiates to his right flank region.  Denies chest pain.  He vomited once.  Wife at bedside states patient was told that he had some problem with his gallbladder 4 to 5 years ago and was advised to get his gallbladder removed at that time.  Wife states patient does sometimes experience right upper quadrant abdominal pain but today the pain is significantly worse.  ED Course: Hemodynamically stable.  No fever.  White blood cell count 15.7.  BUN 29, creatinine 1.3. Baseline creatinine 0.9-1.0.  LFTs normal.  High-sensitivity troponin x2 negative.  EKG without acute ischemic changes.  UA not suggestive of infection.  SARS-CoV-2 test negative.  Chest x-ray showing no active disease.  CT renal stone study without evidence of renal/ureteral stones or obstructive uropathy.  CT showing gallbladder wall thickening, trace amount of ascites adjacent to the liver and minimally adjacent to the gallbladder, and no gallstones.  Findings thought to be suspicious for acute cholecystitis.  Patient subsequently had right upper quadrant ultrasound done which revealed no evidence of gallstones or other significant abnormality.  Patient received Zofran, ceftriaxone, 1 L fluid bolus, and several doses of Dilaudid.  ED provider discussed case with Dr. Marlou Starks from general surgery who recommended continuing antibiotics and obtaining HIDA scan.  Surgery will consult in a.m.   Assessment & Plan:   Principal Problem:   Right upper  quadrant abdominal pain Active Problems:   Emesis   Elevated serum creatinine   Alcohol dependence (HCC)   HTN (hypertension)  Right upper quadrant pain, severe Patient presenting with acute onset right upper quadrant pain x1 day.  Patient is afebrile; but with elevated WBC count of 15.7.  LFTs within normal limits.  CT renal stone study notable for gallbladder wall thickening and a trace amount of ascites adjacent to the liver without gallstones and no bile duct dilation; suspicious for cholecystitis.  No renal/ureteral stones or obstructive uropathy.  Pancreas unremarkable on CT.  Right upper quadrant ultrasound with no evidence of gallstones or other significant normality with a CBD diameter 3 mm. --General surgery following, appreciate assistance --WBC 15.7-->9.1 --Plan HIDA scan today --Pain control, IV Dilaudid versus IV Toradol; avoid narcotics prior to HIDA --Continue empiric antibiotics with ceftriaxone and metronidazole --Follow CBC daily --If HIDA scan negative, consider GI consult for EGD evaluation  Elevation of creatinine, mild Creatinine 1.34 on admission with a baseline 0.9-1.1.  Likely prerenal azotemia secondary to dehydration/anorexia. --Cr 1.34-->1.22 --Continue IV fluids --Monitor urinary output --Follow renal function daily  EtOH dependence Per spouse, patient drinks roughly 3 beers daily.  No current signs of withdrawal symptoms. --CIWAA protocol; with symptom triggered Ativan --Thiamine, folate, multivitamin  Essential hypertension BP 155/88 this morning, not on any home antihypertensives. --Hydralazine 5mg  IV q4hr prn  GERD: Continue PPI  DVT prophylaxis: SCDs, ambulation Code Status: Full code Family Communication: None present at bedside Disposition Plan: Continue inpatient, awaiting HIDA scan, further depending on clinical course   Consultants:   General surgery -Dr. Marlou Starks  Procedures:   HIDA  scan: Pending  Antimicrobials:   Ceftriaxone  10/31>>  Metronidazole 10/31   Subjective: Patient seen and examined bedside, slightly uncomfortable in appearance.  Complaining of continued right-sided abdominal pain with some distention.  Patient reports symptoms exacerbated following food ingestion.  Currently n.p.o. awaiting HIDA scan.  No other complaints or concerns at this time.  Denies headache, no fever/chills/night sweats, no chest pain, no shortness of breath, no nausea/vomiting/diarrhea, no cough/congestion, no weakness, no fatigue, no paresthesias.  No acute events overnight per staff.  Objective: Vitals:   08/04/19 2235 08/04/19 2247 08/05/19 0535 08/05/19 1153  BP: (!) 158/92  (!) 155/88 (!) 143/84  Pulse: 87  87 97  Resp: (!) (!) 22  Temp: 98.2 F (36.8 C)  98 F (36.7 C) 98.2 F (36.8 C)  TempSrc: Oral  Oral Oral  SpO2: 95%  95% 96%  Weight:      Height:        Intake/Output Summary (Last 24 hours) at 08/05/2019 1310 Last data filed at 08/05/2019 0855 Gross per 24 hour  Intake 1000 ml  Output 725 ml  Net 275 ml   Filed Weights   08/04/19 1608  Weight: 88.5 kg    Examination:  General exam:, Slightly uncomfortable appearance, no acute distress Respiratory system: Clear to auscultation. Respiratory effort normal.  Oxygenating well on room air Cardiovascular system: S1 & S2 heard, RRR. No JVD, murmurs, rubs, gallops or clicks. No pedal edema. Gastrointestinal system: Abdomen is slightly distended, tense with right upper quadrant tenderness on palpation.  Positive Murphy sign with splinting on deep inspiration, no organomegaly or masses felt.  Bowel sounds slightly decreased. Central nervous system: Alert and oriented. No focal neurological deficits. Extremities: Symmetric 5 x 5 power. Skin: No rashes, lesions or ulcers Psychiatry: Judgement and insight appear normal. Mood & affect appropriate.     Data Reviewed: I have personally reviewed following labs and imaging studies  CBC: Recent Labs   Lab 08/04/19 1629 08/05/19 0542  WBC 15.7* 9.1  NEUTROABS 12.2*  --   HGB 15.2 16.7  HCT 45.0 48.6  MCV 93.9 93.3  PLT 285 325   Basic Metabolic Panel: Recent Labs  Lab 08/04/19 1629 08/05/19 0542 08/05/19 0716  NA 140  --  141  K 3.6  --  4.3  CL 107  --  108  CO2 24  --  20*  GLUCOSE 190*  --  167*  BUN 21  --  26*  CREATININE 1.34*  --  1.22  CALCIUM 8.7*  --  8.5*  MG  --  1.8  --   PHOS  --  2.7  --    GFR: Estimated Creatinine Clearance: 66.9 mL/min (by C-G formula based on SCr of 1.22 mg/dL). Liver Function Tests: Recent Labs  Lab 08/04/19 1629  AST 19  ALT 20  ALKPHOS 55  BILITOT 0.8  PROT 7.2  ALBUMIN 4.2   No results for input(s): LIPASE, AMYLASE in the last 168 hours. No results for input(s): AMMONIA in the last 168 hours. Coagulation Profile: No results for input(s): INR, PROTIME in the last 168 hours. Cardiac Enzymes: No results for input(s): CKTOTAL, CKMB, CKMBINDEX, TROPONINI in the last 168 hours. BNP (last 3 results) No results for input(s): PROBNP in the last 8760 hours. HbA1C: No results for input(s): HGBA1C in the last 72 hours. CBG: No results for input(s): GLUCAP in the last 168 hours. Lipid Profile: No results for input(s): CHOL, HDL, LDLCALC, TRIG, CHOLHDL, LDLDIRECT  in the last 72 hours. Thyroid Function Tests: No results for input(s): TSH, T4TOTAL, FREET4, T3FREE, THYROIDAB in the last 72 hours. Anemia Panel: No results for input(s): VITAMINB12, FOLATE, FERRITIN, TIBC, IRON, RETICCTPCT in the last 72 hours. Sepsis Labs: No results for input(s): PROCALCITON, LATICACIDVEN in the last 168 hours.  Recent Results (from the past 240 hour(s))  SARS Coronavirus 2 by RT PCR (hospital order, performed in [facility] hospital lab) Nasopharyngeal Nasopharyngeal Swab     Status: None   Collection Time: 08/04/19  8:26 PM   Specimen: Nasopharyngeal Swab  Result Value Ref Range Status   SARS Coronavirus 2 NEGATIVE NEGATIVE Final     Comment: (NOTE) If result is NEGATIVE SARS-CoV-2 target nucleic acids are NOT DETECTED. The SARS-CoV-2 RNA is generally detectable in upper and lower  respiratory specimens during the acute phase of infection. The lowest  concentration of SARS-CoV-2 viral copies this assay can detect is 250  copies / mL. A negative result does not preclude SARS-CoV-2 infection  and should not be used as the sole basis for treatment or other  patient management decisions.  A negative result may occur with  improper specimen collection / handling, submission of specimen other  than nasopharyngeal swab, presence of viral mutation(s) within the  areas targeted by this assay, and inadequate number of viral copies  (<250 copies / mL). A negative result must be combined with clinical  observations, patient history, and epidemiological information. If result is POSITIVE SARS-CoV-2 target nucleic acids are DETECTED. The SARS-CoV-2 RNA is generally detectable in upper and lower  respiratory specimens dur ing the acute phase of infection.  Positive  results are indicative of active infection with SARS-CoV-2.  Clinical  correlation with patient history and other diagnostic information is  necessary to determine patient infection status.  Positive results do  not rule out bacterial infection or co-infection with other viruses. If result is PRESUMPTIVE POSTIVE SARS-CoV-2 nucleic acids MAY BE PRESENT.   A presumptive positive result was obtained on the submitted specimen  and confirmed on repeat testing.  While 2019 novel coronavirus  (SARS-CoV-2) nucleic acids may be present in the submitted sample  additional confirmatory testing may be necessary for epidemiological  and / or clinical management purposes  to differentiate between  SARS-CoV-2 and other Sarbecovirus currently known to infect humans.  If clinically indicated additional testing with an alternate test  methodology [phone]) is advised. The SARS-CoV-2  RNA is generally  detectable in upper and lower respiratory sp ecimens during the acute  phase of infection. The expected result is Negative. Fact Sheet for Patients:  BoilerBrush.com.cy Fact Sheet for Healthcare Providers: https://pope.com/ This test is not yet approved or cleared by the [place] FDA and has been authorized for detection and/or diagnosis of SARS-CoV-2 by FDA under an Emergency Use Authorization (EUA).  This EUA will remain in effect (meaning this test can be used) for the duration of the COVID-19 declaration under Section 564(b)(1) of the Act, 21 U.S.C. section 360bbb-3(b)(1), unless the authorization is terminated or revoked sooner. Performed at [facility], [address], [place], [place] [address]          Radiology Studies: Dg Chest Port 1 View  Result Date: 08/04/2019 CLINICAL DATA:  Severe chest and abdominal pain. Shortness of breath. EXAM: PORTABLE CHEST 1 VIEW COMPARISON:  None. FINDINGS: The heart size and mediastinal contours are within normal limits. Low lung volumes are noted. Both lungs are clear. The visualized skeletal structures are  unremarkable. IMPRESSION: No active disease. Electronically Signed   By: Danae OrleansJohn A Stahl M.D.   On: 08/04/2019 16:53   Ct Renal Stone Study  Result Date: 08/04/2019 CLINICAL DATA:  Right abdomen pain radiating to the right shoulder beginning all of a sudden. No history of stones. EXAM: CT ABDOMEN AND PELVIS WITHOUT CONTRAST TECHNIQUE: Multidetector CT imaging of the abdomen and pelvis was performed following the standard protocol without IV contrast. COMPARISON:  None. FINDINGS: Lower chest: Mild atelectasis at the right lung base. Small hiatal hernia. Hepatobiliary: Gallbladder is moderately distended. Wall is thickened to 5-6 mm. No visualized stone. Liver normal in size and attenuation. No liver mass or focal lesion. No bile duct dilation. Pancreas:  Unremarkable. No pancreatic ductal dilatation or surrounding inflammatory changes. Spleen: Normal in size without focal abnormality. Adrenals/Urinary Tract: No adrenal masses. Kidneys normal size, orientation and position. No renal masses, stones or hydronephrosis. Normal ureters. Normal bladder. Stomach/Bowel: Stomach unremarkable other than the hiatal hernia. Small bowel and colon are normal in caliber. No wall thickening. No inflammation. Appendix not visualized. No evidence of appendicitis. Vascular/Lymphatic: Aortic atherosclerosis. No aneurysm. No enlarged lymph nodes. Reproductive: Unremarkable. Other: Trace amount of ascites lies adjacent to the liver with minimal fluid adjacent to the gallbladder. Musculoskeletal: No fracture or acute finding. No bone lesion. Arthropathic changes of the right hip. Well-positioned left total hip arthroplasty. IMPRESSION: 1. Gallbladder wall thickening. Although this is nonspecific in the setting of ascites (trace amount of ascites noted adjacent to the liver and minimally adjacent to the gallbladder), and although no stone is seen, acute cholecystitis is suspected. Consider follow-up limited right upper quadrant ultrasound for further assessment. 2. No other acute abnormality within the abdomen or pelvis. No renal or ureteral stones or obstructive uropathy. Electronically Signed   By: Amie Portlandavid  Ormond M.D.   On: 08/04/2019 17:14   [place] Abdomen Limited Ruq  Result Date: 08/04/2019 CLINICAL DATA:  Right upper quadrant pain for 1 day. EXAM: ULTRASOUND ABDOMEN LIMITED RIGHT UPPER QUADRANT COMPARISON:  04/01/2015 FINDINGS: Gallbladder: No gallstones or wall thickening visualized. No sonographic Murphy sign noted by sonographer. Common bile duct: Diameter: 3 mm, within normal limits. Liver: No focal lesion identified. Within normal limits in parenchymal echogenicity. Portal vein is patent on color Doppler imaging with normal direction of blood flow towards the liver. Other: None.  IMPRESSION: No evidence of gallstones or other significant abnormality. Electronically Signed   By: Danae OrleansJohn A Stahl M.D.   On: 08/04/2019 20:44        Scheduled Meds: . folic acid  1 mg Oral Daily  . ketorolac  15 mg Intravenous Q6H  . multivitamin with minerals  1 tablet Oral Daily  . thiamine  100 mg Oral Daily   Or  . thiamine  100 mg Intravenous Daily   Continuous Infusions: . cefTRIAXone (ROCEPHIN)  IV    . metronidazole 500 mg (08/05/19 0513)     LOS: 1 day    Time spent: 39 minutes spent on chart review, discussion with nursing staff, consultants, updating family and interview/physical exam; more than 50% of that time was spent in counseling and/or coordination of care.    Alvira PhilipsEric J [place], DO [facility] 08/05/2019, 1:10 PM

## 2019-08-05 NOTE — Progress Notes (Signed)
Pt taken downstairs for Hida scan.

## 2019-08-05 NOTE — Progress Notes (Signed)
Pt has been brought back to Room 1506 from [facility]. Pain med given. O2 at 2l/min with O2 pulse ox cont. At 93%. Spouse has arrived at bedside.

## 2019-08-05 NOTE — Progress Notes (Signed)
Pt has returned from [place] scan. Nauseous after dose of Dilaudid, which is not new and only last for a few moments. After small amount of emesis (pt states is the "crap I got in [place] scan"), pt laid back in bed and is now trying to relax. Scheduled Toradol given. Pain is getting better. Will continue to monitor.

## 2019-08-05 NOTE — Progress Notes (Signed)
Have noted that Hida scan was put in as a STAT order by ED doctor last night. Unaware of this as Summary page just states scan. Have alerted [facility] [place]. Hida scan to be done this afternoon at approximately 1500. Pt and spouse made aware.

## 2019-08-06 LAB — BASIC METABOLIC PANEL
Anion gap: 12 (ref 5–15)
BUN: 48 mg/dL — ABNORMAL HIGH (ref 8–23)
CO2: 19 mmol/L — ABNORMAL LOW (ref 22–32)
Calcium: 8.9 mg/dL (ref 8.9–10.3)
Chloride: 108 mmol/L (ref 98–111)
Creatinine, Ser: 1.74 mg/dL — ABNORMAL HIGH (ref 0.61–1.24)
GFR calc Af Amer: 48 mL/min — ABNORMAL LOW (ref >60)
GFR calc non Af Amer: 41 mL/min — ABNORMAL LOW (ref >60)
Glucose, Bld: 186 mg/dL — ABNORMAL HIGH (ref 70–99)
Potassium: 4.2 mmol/L (ref 3.5–5.1)
Sodium: 139 mmol/L (ref 135–145)

## 2019-08-06 LAB — CBC
HCT: 52.1 % — ABNORMAL HIGH (ref 39.0–52.0)
Hemoglobin: 17.4 g/dL — ABNORMAL HIGH (ref 13.0–17.0)
MCH: 32.2 pg (ref 26.0–34.0)
MCHC: 33.4 g/dL (ref 30.0–36.0)
MCV: 96.5 fL (ref 80.0–100.0)
Platelets: 367 10*3/uL (ref 150–400)
RBC: 5.4 MIL/uL (ref 4.22–5.81)
RDW: 12.8 % (ref 11.5–15.5)
WBC: 9.9 10*3/uL (ref 4.0–10.5)
nRBC: 0 % (ref 0.0–0.2)

## 2019-08-06 LAB — SODIUM, URINE, RANDOM: Sodium, Ur: 10 mmol/L

## 2019-08-06 LAB — CREATININE, URINE, RANDOM: Creatinine, Urine: 242.63 mg/dL

## 2019-08-06 MED ORDER — SODIUM CHLORIDE 0.9 % IV SOLN
INTRAVENOUS | Status: DC
  Administered 2019-08-06 – 2019-08-09 (×6): via INTRAVENOUS

## 2019-08-06 NOTE — Progress Notes (Signed)
[facility] [place] [facility] Progress Note     Subjective: CC-  Abdominal pain somewhat different and improved today, although not resolved. Pain is now in his lower abdomen. He reports n/v yesterday. Emesis after PO intake. Last BM was on Saturday.   HIDA negative for acute cholecystitis.   Objective: Vital signs in last 24 hours: Temp:  [98.1 F (36.7 C)-98.4 F (36.9 C)] 98.3 F (36.8 C) (11/02 0537) Pulse Rate:  [97-110] 101 (11/02 0800) Resp:  [19-22] 19 (11/02 0537) BP: (118-145)/(83-94) 118/86 (11/02 0537) SpO2:  [91 %-98 %] 91 % (11/02 0537)    Intake/Output from previous day: 11/01 0701 - 11/02 0700 In: 198.3 [I.V.:48.3; IV Piggyback:150] Out: 799 [Urine:249; Emesis/NG output:550] Intake/Output this shift: Total I/O In: 137.8 [I.V.:37.8; IV Piggyback:100] Out: -   PE: Gen:  Alert, NAD HEENT: EOM's intact, pupils equal and round Card:  RRR Pulm:  CTAB, no W/R/R, effort normal Abd: Somewhat firm, nondistended, +BS, no HSM or hernia, mild lower abdominal TTP Skin: no rashes noted, warm and dry  Lab Results:  Recent Labs    08/05/19 0542 08/06/19 0522  WBC 9.1 9.9  HGB 16.7 17.4*  HCT 48.6 52.1*  PLT 325 367   BMET Recent Labs    08/05/19 0716 08/06/19 0522  NA 141 139  K 4.3 4.2  CL 108 108  CO2 20* 19*  GLUCOSE 167* 186*  BUN 26* 48*  CREATININE 1.22 1.74*  CALCIUM 8.5* 8.9   PT/INR No results for input(s): LABPROT, INR in the last 72 hours. CMP     Component Value Date/Time   NA 139 08/06/2019 0522   K 4.2 08/06/2019 0522   CL 108 08/06/2019 0522   CO2 19 (L) 08/06/2019 0522   GLUCOSE 186 (H) 08/06/2019 0522   BUN 48 (H) 08/06/2019 0522   CREATININE 1.74 (H) 08/06/2019 0522   CALCIUM 8.9 08/06/2019 0522   PROT 7.2 08/04/2019 1629   ALBUMIN 4.2 08/04/2019 1629   AST 19 08/04/2019 1629   ALT 20 08/04/2019 1629   ALKPHOS 55 08/04/2019 1629   BILITOT 0.8 08/04/2019 1629   GFRNONAA 41 (L) 08/06/2019 0522   GFRAA 48 (L) 08/06/2019 0522    Lipase     Component Value Date/Time   LIPASE 16 (L) 04/01/2015 0904       Studies/Results: Nm Hepato W/eject Fract  Result Date: 08/05/2019 CLINICAL DATA:  Severe right upper quadrant abdominal pain and vomiting. EXAM: NUCLEAR MEDICINE HEPATOBILIARY IMAGING WITH GALLBLADDER EF TECHNIQUE: Sequential images of the abdomen were obtained out to 60 minutes following intravenous administration of radiopharmaceutical. After oral ingestion of Ensure, gallbladder ejection fraction was determined. At 60 min, normal ejection fraction is greater than 33%. RADIOPHARMACEUTICALS:  5.2 mCi Tc-73m  Choletec IV COMPARISON:  CT scan and ultrasound examination 08/04/2019 FINDINGS: There is prompt symmetric uptake in the liver and prompt excretion into the biliary tree which is visualized by 5 minutes. Activity is seen in the small bowel by 10 minutes. The gallbladder is visualized at 15 minutes. Calculated gallbladder ejection fraction is 72%. (Normal gallbladder ejection fraction with Ensure is greater than 33%.) IMPRESSION: Normal biliary patency study and normal gallbladder ejection fraction. Electronically Signed   By: Marijo Sanes M.D.   On: 08/05/2019 17:52   Dg Chest Port 1 View  Result Date: 08/04/2019 CLINICAL DATA:  Severe chest and abdominal pain. Shortness of breath. EXAM: PORTABLE CHEST 1 VIEW COMPARISON:  None. FINDINGS: The heart size and mediastinal contours are within normal limits.  Low lung volumes are noted. Both lungs are clear. The visualized skeletal structures are unremarkable. IMPRESSION: No active disease. Electronically Signed   By: Danae OrleansJohn A Stahl M.D.   On: 08/04/2019 16:53   Ct Renal Stone Study  Result Date: 08/04/2019 CLINICAL DATA:  Right abdomen pain radiating to the right shoulder beginning all of a sudden. No history of stones. EXAM: CT ABDOMEN AND PELVIS WITHOUT CONTRAST TECHNIQUE: Multidetector CT imaging of the abdomen and pelvis was performed following the standard  protocol without IV contrast. COMPARISON:  None. FINDINGS: Lower chest: Mild atelectasis at the right lung base. Small hiatal hernia. Hepatobiliary: Gallbladder is moderately distended. Wall is thickened to 5-6 mm. No visualized stone. Liver normal in size and attenuation. No liver mass or focal lesion. No bile duct dilation. Pancreas: Unremarkable. No pancreatic ductal dilatation or surrounding inflammatory changes. Spleen: Normal in size without focal abnormality. Adrenals/Urinary Tract: No adrenal masses. Kidneys normal size, orientation and position. No renal masses, stones or hydronephrosis. Normal ureters. Normal bladder. Stomach/Bowel: Stomach unremarkable other than the hiatal hernia. Small bowel and colon are normal in caliber. No wall thickening. No inflammation. Appendix not visualized. No evidence of appendicitis. Vascular/Lymphatic: Aortic atherosclerosis. No aneurysm. No enlarged lymph nodes. Reproductive: Unremarkable. Other: Trace amount of ascites lies adjacent to the liver with minimal fluid adjacent to the gallbladder. Musculoskeletal: No fracture or acute finding. No bone lesion. Arthropathic changes of the right hip. Well-positioned left total hip arthroplasty. IMPRESSION: 1. Gallbladder wall thickening. Although this is nonspecific in the setting of ascites (trace amount of ascites noted adjacent to the liver and minimally adjacent to the gallbladder), and although no stone is seen, acute cholecystitis is suspected. Consider follow-up limited right upper quadrant ultrasound for further assessment. 2. No other acute abnormality within the abdomen or pelvis. No renal or ureteral stones or obstructive uropathy. Electronically Signed   By: Amie Portlandavid  Ormond M.D.   On: 08/04/2019 17:14   [place] Abdomen Limited Ruq  Result Date: 08/04/2019 CLINICAL DATA:  Right upper quadrant pain for 1 day. EXAM: ULTRASOUND ABDOMEN LIMITED RIGHT UPPER QUADRANT COMPARISON:  04/01/2015 FINDINGS: Gallbladder: No  gallstones or wall thickening visualized. No sonographic Murphy sign noted by sonographer. Common bile duct: Diameter: 3 mm, within normal limits. Liver: No focal lesion identified. Within normal limits in parenchymal echogenicity. Portal vein is patent on color Doppler imaging with normal direction of blood flow towards the liver. Other: None. IMPRESSION: No evidence of gallstones or other significant abnormality. Electronically Signed   By: Danae OrleansJohn A Stahl M.D.   On: 08/04/2019 20:44    Anti-infectives: Anti-infectives (From admission, onward)   Start     Dose/Rate Route Frequency Ordered Stop   08/05/19 2100  cefTRIAXone (ROCEPHIN) 2 g in sodium chloride 0.9 % 100 mL IVPB     2 g 200 mL/hr over 30 Minutes Intravenous Every 24 hours 08/04/19 2157     08/04/19 2200  metroNIDAZOLE (FLAGYL) IVPB 500 mg     500 mg 100 mL/hr over 60 Minutes Intravenous Every 8 hours 08/04/19 2157     08/04/19 2115  cefTRIAXone (ROCEPHIN) 2 g in sodium chloride 0.9 % 100 mL IVPB     2 g 200 mL/hr over 30 Minutes Intravenous  Once 08/04/19 2108 08/04/19 2218       Assessment/Plan ETOH dependence HTN GERD AKI  Right sided abdominal pain Nausea and vomiting - HIDA negative for acute cholecystitis - Recommend GI consult for consideration of EGD. General surgery will sign off, please call us  with concerns.  ID - rocephin/flagyl 10/31>> FEN - IVF, NPO VTE - SCDs, ambulation Foley - none   LOS: 2 days    Franne Forts, [facility] 08/06/2019, 8:32 AM Please see Amion for pager number during day hours 7:00am-4:30pm

## 2019-08-06 NOTE — Progress Notes (Signed)
PROGRESS NOTE    Corey Logan  OQH:[id number] DOB: [birth year] DOA: 08/04/2019 PCP: Elias Else, MD   Brief Narrative:  Corey Logan is a 63 y.o. male with medical history significant of arthritis, depression, GERD, hiatal hernia, hypertension presenting with a chief complaint of right upper quadrant abdominal pain.  History limited as patient appears uncomfortable due to pain.  Reports having acute onset right upper quadrant abdominal pain this afternoon.  The pain has been constant since then and radiates to his right flank region.  Denies chest pain.  He vomited once.  Wife at bedside states patient was told that he had some problem with his gallbladder 4 to 5 years ago and was advised to get his gallbladder removed at that time.  Wife states patient does sometimes experience right upper quadrant abdominal pain but today the pain is significantly worse.  ED Course: Hemodynamically stable.  No fever.  White blood cell count 15.7.  BUN 29, creatinine 1.3. Baseline creatinine 0.9-1.0.  LFTs normal.  High-sensitivity troponin x2 negative.  EKG without acute ischemic changes.  UA not suggestive of infection.  SARS-CoV-2 test negative.  Chest x-ray showing no active disease.  CT renal stone study without evidence of renal/ureteral stones or obstructive uropathy.  CT showing gallbladder wall thickening, trace amount of ascites adjacent to the liver and minimally adjacent to the gallbladder, and no gallstones.  Findings thought to be suspicious for acute cholecystitis.  Patient subsequently had right upper quadrant ultrasound done which revealed no evidence of gallstones or other significant abnormality.  Patient received Zofran, ceftriaxone, 1 L fluid bolus, and several doses of Dilaudid.  ED provider discussed case with Dr. Carolynne Edouard from general surgery who recommended continuing antibiotics and obtaining HIDA scan.  Surgery will consult in a.m.   Assessment & Plan:   Principal Problem:   Right upper  quadrant abdominal pain Active Problems:   Emesis   Elevated serum creatinine   Alcohol dependence (HCC)   HTN (hypertension)  Right upper quadrant pain, severe Patient presenting with acute onset right upper quadrant pain x1 day.  Patient is afebrile; but with elevated WBC count of 15.7.  LFTs within normal limits.  CT renal stone study notable for gallbladder wall thickening and a trace amount of ascites adjacent to the liver without gallstones and no bile duct dilation; suspicious for cholecystitis.  No renal/ureteral stones or obstructive uropathy.  Pancreas unremarkable on CT.  Right upper quadrant ultrasound with no evidence of gallstones or other significant normality with a CBD diameter 3 mm.  HIDA scan shows normal biliary patency study and normal ejection fraction.  General surgery believes symptoms not consistent with gallbladder disease with unrevealing imaging studies and recommend GI evaluation for consideration of EGD, signed off 08/06/2019.   --WBC 15.7-->9.1-->9.9 --Pain control, IV Dilaudid versus IV Toradol --PPI, antiemetics --Continue empiric antibiotics with ceftriaxone and metronidazole --Follow CBC daily --Clear liquid diet --GI plans upper GI series tomorrow for further evaluation  Elevation of creatinine, mild Creatinine 1.34 on admission with a baseline 0.9-1.1.  Likely prerenal azotemia secondary to dehydration/anorexia.  Kidneys with normal appearance without mass, stones or hydronephrosis noted on admission CT. --Cr 1.34-->1.22-->1.74 --Check FeNa with urine Na/Cr --Continue IV fluids --Monitor urinary output --If renal function further worsens, will obtain renal ultrasound --Follow renal function daily  EtOH dependence Per spouse, patient drinks roughly 6 beers daily.  No current signs of withdrawal symptoms. --CIWAA protocol; with symptom triggered Ativan --Thiamine, folate, multivitamin  Essential hypertension BP 118/86  this morning, not on any home  antihypertensives. --Hydralazine  IV q4hr prn  GERD: Continue PPI  DVT prophylaxis: SCDs, ambulation Code Status: Full code Family Communication: None present at bedside Disposition Plan: Continue inpatient, awaiting HIDA scan, further depending on clinical course   Consultants:   General surgery - Dr. Carolynne Edouard  Procedures:  HIDA scan: IMPRESSION: Normal biliary patency study and normal gallbladder ejection fraction.  Antimicrobials:   Ceftriaxone 10/31>>  Metronidazole 10/31   Subjective: Patient seen and examined bedside, continues with nausea and abdominal discomfort, pain now appears to be more significant in the epigastric and lower quadrant regions.  HIDA scan yesterday unrevealing for gallbladder pathology; general surgery has signed off with recommendations of GI consult today.  No other complaints or concerns at this time.  Denies headache, no fever/chills/night sweats, no chest pain, no shortness of breath, no nausea/vomiting/diarrhea, no cough/congestion, no weakness, no fatigue, no paresthesias.  No acute events overnight per staff.  Objective: Vitals:   08/05/19 2038 08/06/19 0537 08/06/19 0800 08/06/19 1319  BP: (!) 145/94 118/86  (!) 159/93  Pulse: (!) 102 (!) 107 (!) 101 97  Resp: Temp: 98.1 F (36.7 C) 98.3 F (36.8 C)  98.2 F (36.8 C)  TempSrc: Oral Oral    SpO2: 98% 91%  95%  Weight:      Height:        Intake/Output Summary (Last 24 hours) at 08/06/2019 1448 Last data filed at 08/06/2019 1327 Gross per 24 hour  Intake 336.1 ml  Output 849 ml  Net -512.9 ml   Filed Weights   08/04/19 1608  Weight: 88.5 kg    Examination:  General exam:, Slightly uncomfortable appearance, no acute distress Respiratory system: Clear to auscultation. Respiratory effort normal.  Oxygenating well on room air Cardiovascular system: S1 & S2 heard, RRR. No JVD, murmurs, rubs, gallops or clicks. No pedal edema. Gastrointestinal system: Abdomen is  slightly distended, tense with right upper quadrant tenderness on palpation.  Tender to palpation epigastrium and bilateral lower quadrants, no organomegaly or masses felt.  Bowel sounds slightly decreased. Central nervous system: Alert and oriented. No focal neurological deficits. Extremities: Symmetric 5 x 5 power. Skin: No rashes, lesions or ulcers Psychiatry: Judgement and insight appear normal. Mood & affect appropriate.     Data Reviewed: I have personally reviewed following labs and imaging studies  CBC: Recent Labs  Lab 08/04/19 1629 08/05/19 0542 08/06/19 0522  WBC 15.7* 9.1 9.9  NEUTROABS 12.2*  --   --   HGB 15.2 16.7 17.4*  HCT 45.0 48.6 52.1*  MCV 93.9 93.3 96.5  PLT 285 325 367   Basic Metabolic Panel: Recent Labs  Lab 08/04/19 1629 08/05/19 0542 08/05/19 0716 08/06/19 0522  NA 140  --  141 139  K 3.6  --  4.3 4.2  CL 107  --  108 108  CO2 24  --  20* 19*  GLUCOSE 190*  --  167* 186*  BUN 21  --  26* 48*  CREATININE 1.34*  --  1.22 1.74*  CALCIUM 8.7*  --  8.5* 8.9  MG  --  1.8  --   --   PHOS  --  2.7  --   --    GFR: Estimated Creatinine Clearance: 46.9 mL/min (A) (by C-G formula based on SCr of 1.74 mg/dL (H)). Liver Function Tests: Recent Labs  Lab 08/04/19 1629  AST 19  ALT 20  ALKPHOS 55  BILITOT 0.8  PROT 7.2  ALBUMIN 4.2   No results for input(s): LIPASE, AMYLASE in the last 168 hours. No results for input(s): AMMONIA in the last 168 hours. Coagulation Profile: No results for input(s): INR, PROTIME in the last 168 hours. Cardiac Enzymes: No results for input(s): CKTOTAL, CKMB, CKMBINDEX, TROPONINI in the last 168 hours. BNP (last 3 results) No results for input(s): PROBNP in the last 8760 hours. HbA1C: No results for input(s): HGBA1C in the last 72 hours. CBG: No results for input(s): GLUCAP in the last 168 hours. Lipid Profile: No results for input(s): CHOL, HDL, LDLCALC, TRIG, CHOLHDL, LDLDIRECT in the last 72 hours. Thyroid  Function Tests: No results for input(s): TSH, T4TOTAL, FREET4, T3FREE, THYROIDAB in the last 72 hours. Anemia Panel: No results for input(s): VITAMINB12, FOLATE, FERRITIN, TIBC, IRON, RETICCTPCT in the last 72 hours. Sepsis Labs: No results for input(s): PROCALCITON, LATICACIDVEN in the last 168 hours.  Recent Results (from the past 240 hour(s))  SARS Coronavirus 2 by RT PCR (hospital order, performed in [facility] hospital lab) Nasopharyngeal Nasopharyngeal Swab     Status: None   Collection Time: 08/04/19  8:26 PM   Specimen: Nasopharyngeal Swab  Result Value Ref Range Status   SARS Coronavirus 2 NEGATIVE NEGATIVE Final    Comment: (NOTE) If result is NEGATIVE SARS-CoV-2 target nucleic acids are NOT DETECTED. The SARS-CoV-2 RNA is generally detectable in upper and lower  respiratory specimens during the acute phase of infection. The lowest  concentration of SARS-CoV-2 viral copies this assay can detect is 250  copies / mL. A negative result does not preclude SARS-CoV-2 infection  and should not be used as the sole basis for treatment or other  patient management decisions.  A negative result may occur with  improper specimen collection / handling, submission of specimen other  than nasopharyngeal swab, presence of viral mutation(s) within the  areas targeted by this assay, and inadequate number of viral copies  (<250 copies / mL). A negative result must be combined with clinical  observations, patient history, and epidemiological information. If result is POSITIVE SARS-CoV-2 target nucleic acids are DETECTED. The SARS-CoV-2 RNA is generally detectable in upper and lower  respiratory specimens dur ing the acute phase of infection.  Positive  results are indicative of active infection with SARS-CoV-2.  Clinical  correlation with patient history and other diagnostic information is  necessary to determine patient infection status.  Positive results do  not rule out bacterial  infection or co-infection with other viruses. If result is PRESUMPTIVE POSTIVE SARS-CoV-2 nucleic acids MAY BE PRESENT.   A presumptive positive result was obtained on the submitted specimen  and confirmed on repeat testing.  While 2019 novel coronavirus  (SARS-CoV-2) nucleic acids may be present in the submitted sample  additional confirmatory testing may be necessary for epidemiological  and / or clinical management purposes  to differentiate between  SARS-CoV-2 and other Sarbecovirus currently known to infect humans.  If clinically indicated additional testing with an alternate test  methodology [id number]) is advised. The SARS-CoV-2 RNA is generally  detectable in upper and lower respiratory sp ecimens during the acute  phase of infection. The expected result is Negative. Fact Sheet for Patients:  BoilerBrush.com.cyhttps://www.fda.gov/media/136312/download Fact Sheet for Healthcare Providers: https://pope.com/https://www.fda.gov/media/136313/download This test is not yet approved or cleared by the [place] FDA and has been authorized for detection and/or diagnosis of SARS-CoV-2 by FDA under an Emergency Use Authorization (EUA).  This EUA will remain in effect (meaning this test can  be used) for the duration of the COVID-19 declaration under Section 564(b)(1) of the Act, 21 U.S.C. section 360bbb-3(b)(1), unless the authorization is terminated or revoked sooner. Performed at [facility], [address], [place], [place] [address]          Radiology Studies: Nm Hepato W/eject Fract  Result Date: 08/05/2019 CLINICAL DATA:  Severe right upper quadrant abdominal pain and vomiting. EXAM: NUCLEAR MEDICINE HEPATOBILIARY IMAGING WITH GALLBLADDER EF TECHNIQUE: Sequential images of the abdomen were obtained out to 60 minutes following intravenous administration of radiopharmaceutical. After oral ingestion of Ensure, gallbladder ejection fraction was determined. At 60 min, normal ejection fraction is  greater than 33%. RADIOPHARMACEUTICALS:  5.2 mCi Tc-24m  Choletec IV COMPARISON:  CT scan and ultrasound examination 08/04/2019 FINDINGS: There is prompt symmetric uptake in the liver and prompt excretion into the biliary tree which is visualized by 5 minutes. Activity is seen in the small bowel by 10 minutes. The gallbladder is visualized at 15 minutes. Calculated gallbladder ejection fraction is 72%. (Normal gallbladder ejection fraction with Ensure is greater than 33%.) IMPRESSION: Normal biliary patency study and normal gallbladder ejection fraction. Electronically Signed   By: Rudie Meyer M.D.   On: 08/05/2019 17:52   Dg Chest Port 1 View  Result Date: 08/04/2019 CLINICAL DATA:  Severe chest and abdominal pain. Shortness of breath. EXAM: PORTABLE CHEST 1 VIEW COMPARISON:  None. FINDINGS: The heart size and mediastinal contours are within normal limits. Low lung volumes are noted. Both lungs are clear. The visualized skeletal structures are unremarkable. IMPRESSION: No active disease. Electronically Signed   By: Danae Orleans M.D.   On: 08/04/2019 16:53   Ct Renal Stone Study  Result Date: 08/04/2019 CLINICAL DATA:  Right abdomen pain radiating to the right shoulder beginning all of a sudden. No history of stones. EXAM: CT ABDOMEN AND PELVIS WITHOUT CONTRAST TECHNIQUE: Multidetector CT imaging of the abdomen and pelvis was performed following the standard protocol without IV contrast. COMPARISON:  None. FINDINGS: Lower chest: Mild atelectasis at the right lung base. Small hiatal hernia. Hepatobiliary: Gallbladder is moderately distended. Wall is thickened to 5-6 mm. No visualized stone. Liver normal in size and attenuation. No liver mass or focal lesion. No bile duct dilation. Pancreas: Unremarkable. No pancreatic ductal dilatation or surrounding inflammatory changes. Spleen: Normal in size without focal abnormality. Adrenals/Urinary Tract: No adrenal masses. Kidneys normal size, orientation and  position. No renal masses, stones or hydronephrosis. Normal ureters. Normal bladder. Stomach/Bowel: Stomach unremarkable other than the hiatal hernia. Small bowel and colon are normal in caliber. No wall thickening. No inflammation. Appendix not visualized. No evidence of appendicitis. Vascular/Lymphatic: Aortic atherosclerosis. No aneurysm. No enlarged lymph nodes. Reproductive: Unremarkable. Other: Trace amount of ascites lies adjacent to the liver with minimal fluid adjacent to the gallbladder. Musculoskeletal: No fracture or acute finding. No bone lesion. Arthropathic changes of the right hip. Well-positioned left total hip arthroplasty. IMPRESSION: 1. Gallbladder wall thickening. Although this is nonspecific in the setting of ascites (trace amount of ascites noted adjacent to the liver and minimally adjacent to the gallbladder), and although no stone is seen, acute cholecystitis is suspected. Consider follow-up limited right upper quadrant ultrasound for further assessment. 2. No other acute abnormality within the abdomen or pelvis. No renal or ureteral stones or obstructive uropathy. Electronically Signed   By: Amie Portland M.D.   On: 08/04/2019 17:14   US Abdomen Limited Ruq  Result Date: 08/04/2019 CLINICAL DATA:  Right upper quadrant pain for  1 day. EXAM: ULTRASOUND ABDOMEN LIMITED RIGHT UPPER QUADRANT COMPARISON:  04/01/2015 FINDINGS: Gallbladder: No gallstones or wall thickening visualized. No sonographic Murphy sign noted by sonographer. Common bile duct: Diameter: 3 mm, within normal limits. Liver: No focal lesion identified. Within normal limits in parenchymal echogenicity. Portal vein is patent on color Doppler imaging with normal direction of blood flow towards the liver. Other: None. IMPRESSION: No evidence of gallstones or other significant abnormality. Electronically Signed   By: Marlaine Hind M.D.   On: 08/04/2019 20:44        Scheduled Meds: . folic acid  1 mg Oral Daily  .  multivitamin with minerals  1 tablet Oral Daily  . omeprazole  20 mg Oral Daily  . thiamine  100 mg Oral Daily   Or  . thiamine  100 mg Intravenous Daily   Continuous Infusions: . sodium chloride 250 mL (08/05/19 2050)  . sodium chloride 100 mL/hr at 08/06/19 0808  . cefTRIAXone (ROCEPHIN)  IV Stopped (08/05/19 2149)  . metronidazole 500 mg (08/06/19 1314)     LOS: 2 days    Time spent: 32 minutes spent on chart review, discussion with nursing staff, consultants, updating family and interview/physical exam; more than 50% of that time was spent in counseling and/or coordination of care.     J [place] DO [facility] 08/06/2019, 2:48 PM
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
## 2019-08-07 ENCOUNTER — Inpatient Hospital Stay (HOSPITAL_COMMUNITY): Payer: PPO

## 2019-08-07 DIAGNOSIS — F10931 Alcohol use, unspecified with withdrawal delirium: Secondary | ICD-10-CM | POA: Diagnosis not present

## 2019-08-07 DIAGNOSIS — F10231 Alcohol dependence with withdrawal delirium: Secondary | ICD-10-CM | POA: Diagnosis not present

## 2019-08-07 LAB — CBC
HCT: 48.6 % (ref 39.0–52.0)
Hemoglobin: 15.8 g/dL (ref 13.0–17.0)
MCH: 31.3 pg (ref 26.0–34.0)
MCHC: 32.5 g/dL (ref 30.0–36.0)
MCV: 96.4 fL (ref 80.0–100.0)
Platelets: 356 10*3/uL (ref 150–400)
RBC: 5.04 MIL/uL (ref 4.22–5.81)
RDW: 12.7 % (ref 11.5–15.5)
WBC: 11.1 10*3/uL — ABNORMAL HIGH (ref 4.0–10.5)
nRBC: 0 % (ref 0.0–0.2)

## 2019-08-07 LAB — COMPREHENSIVE METABOLIC PANEL
ALT: 13 U/L (ref 0–44)
AST: 17 U/L (ref 15–41)
Albumin: 2.9 g/dL — ABNORMAL LOW (ref 3.5–5.0)
Alkaline Phosphatase: 39 U/L (ref 38–126)
Anion gap: 10 (ref 5–15)
BUN: 41 mg/dL — ABNORMAL HIGH (ref 8–23)
CO2: 22 mmol/L (ref 22–32)
Calcium: 8.5 mg/dL — ABNORMAL LOW (ref 8.9–10.3)
Chloride: 107 mmol/L (ref 98–111)
Creatinine, Ser: 1.23 mg/dL (ref 0.61–1.24)
GFR calc Af Amer: >60 mL/min (ref >60)
GFR calc non Af Amer: >60 mL/min (ref >60)
Glucose, Bld: 158 mg/dL — ABNORMAL HIGH (ref 70–99)
Potassium: 3.7 mmol/L (ref 3.5–5.1)
Sodium: 139 mmol/L (ref 135–145)
Total Bilirubin: 1 mg/dL (ref 0.3–1.2)
Total Protein: 6.3 g/dL — ABNORMAL LOW (ref 6.5–8.1)

## 2019-08-07 LAB — LIPASE, BLOOD: Lipase: 23 U/L (ref 11–51)

## 2019-08-07 MED ORDER — SODIUM CHLORIDE 0.9% FLUSH
10.0000 mL | Freq: Two times a day (BID) | INTRAVENOUS | Status: DC
  Administered 2019-08-07 – 2019-08-10 (×6): 10 mL

## 2019-08-07 MED ORDER — PANTOPRAZOLE SODIUM 40 MG IV SOLR
40.0000 mg | Freq: Two times a day (BID) | INTRAVENOUS | Status: DC
  Administered 2019-08-07 – 2019-08-10 (×6): 40 mg via INTRAVENOUS
  Filled 2019-08-07 (×7): qty 40

## 2019-08-07 MED ORDER — LORAZEPAM 1 MG PO TABS: 1.0000 mg | ORAL_TABLET | ORAL | Status: AC | PRN

## 2019-08-07 MED ORDER — LORAZEPAM 2 MG/ML IJ SOLN
1.0000 mg | INTRAMUSCULAR | Status: AC | PRN
  Administered 2019-08-07 – 2019-08-08 (×3): 2 mg via INTRAVENOUS
  Filled 2019-08-07 (×4): qty 1

## 2019-08-07 MED ORDER — SODIUM CHLORIDE 0.9% FLUSH
10.0000 mL | INTRAVENOUS | Status: DC | PRN
  Administered 2019-08-08: 10 mL
  Filled 2019-08-07: qty 40

## 2019-08-07 NOTE — Care Management Important Message (Signed)
Important Message  Patient Details IM Letter given to Sharren Bridge SW to present to the Patient Name: Corey Logan MRN: [id number] Date of Birth: Jun 02, 1956   Medicare Important Message Given:  Yes     Kerin Salen 08/07/2019, 9:48 AM

## 2019-08-07 NOTE — Progress Notes (Signed)
Assessment & Plan: Abdominal pain - discussed yesterday with Dr. Dulce Sellar from gastroenterology - UGI series pending this AM - empiric Rocephin/Flagyl - will consider "empiric" cholecystectomy based on persistent symptoms if no other etiology identified - will follow        Darnell Level, MD       [facility]       Office: [phone]   Chief Complaint: Abdominal pain  Subjective: Patient in bed, states he's already been to radiology.  Wants something to drink.  Persistent diffuse abdominal pain, poorly localized.  Objective: Vital signs in last 24 hours: Temp:  [97.6 F (36.4 C)-99.1 F (37.3 C)] 99.1 F (37.3 C) (11/03 0531) Pulse Rate:  [97-105] 103 (11/03 0531) Resp:  [16] 16 (11/03 0531) BP: (151-159)/(89-95) 151/95 (11/03 0531) SpO2:  [95 %-97 %] 97 % (11/03 0531)    Intake/Output from previous day: 11/02 0701 - 11/03 0700 In: 2382.8 [P.O.:75; I.V.:1846.7; IV Piggyback:461.1] Out: 1750 [Urine:1750] Intake/Output this shift: Total I/O In: 134.5 [I.V.:95.6; IV Piggyback:38.9] Out: 200 [Emesis/NG output:200]  Physical Exam: HEENT - sclerae clear, mucous membranes moist Neck - soft Chest - clear bilaterally Cor - RRR Abdomen - mildly distended, tense; diffusely tender; no mass; no guarding Ext - no edema, non-tender Neuro - alert & oriented, no focal deficits  Lab Results:  Recent Labs    08/06/19 0522 08/07/19 0517  WBC 9.9 11.1*  HGB 17.4* 15.8  HCT 52.1* 48.6  PLT 367 356   BMET Recent Labs    08/06/19 0522 08/07/19 0517  NA 139 139  K 4.2 3.7  CL 108 107  CO2 19* 22  GLUCOSE 186* 158*  BUN 48* 41*  CREATININE 1.74* 1.23  CALCIUM 8.9 8.5*   PT/INR No results for input(s): LABPROT, INR in the last 72 hours. Comprehensive Metabolic Panel:    Component Value Date/Time   NA 139 08/07/2019 0517   NA 139 08/06/2019 0522   K 3.7 08/07/2019 0517   K 4.2 08/06/2019 0522   CL 107 08/07/2019 0517   CL 108 08/06/2019  0522   CO2 22 08/07/2019 0517   CO2 19 (L) 08/06/2019 0522   BUN 41 (H) 08/07/2019 0517   BUN 48 (H) 08/06/2019 0522   CREATININE 1.23 08/07/2019 0517   CREATININE 1.74 (H) 08/06/2019 0522   GLUCOSE 158 (H) 08/07/2019 0517   GLUCOSE 186 (H) 08/06/2019 0522   CALCIUM 8.5 (L) 08/07/2019 0517   CALCIUM 8.9 08/06/2019 0522   AST 17 08/07/2019 0517   AST 19 08/04/2019 1629   ALT 13 08/07/2019 0517   ALT 20 08/04/2019 1629   ALKPHOS 39 08/07/2019 0517   ALKPHOS 55 08/04/2019 1629   BILITOT 1.0 08/07/2019 0517   BILITOT 0.8 08/04/2019 1629   PROT 6.3 (L) 08/07/2019 0517   PROT 7.2 08/04/2019 1629   ALBUMIN 2.9 (L) 08/07/2019 0517   ALBUMIN 4.2 08/04/2019 1629    Studies/Results: Nm Hepato W/eject Fract  Result Date: 08/05/2019 CLINICAL DATA:  Severe right upper quadrant abdominal pain and vomiting. EXAM: NUCLEAR MEDICINE HEPATOBILIARY IMAGING WITH GALLBLADDER EF TECHNIQUE: Sequential images of the abdomen were obtained out to 60 minutes following intravenous administration of radiopharmaceutical. After oral ingestion of Ensure, gallbladder ejection fraction was determined. At 60 min, normal ejection fraction is greater than 33%. RADIOPHARMACEUTICALS:  5.2 mCi Tc-47m  Choletec IV COMPARISON:  CT scan and ultrasound examination 08/04/2019 FINDINGS: There is prompt symmetric uptake in the liver and prompt excretion into the  biliary tree which is visualized by 5 minutes. Activity is seen in the small bowel by 10 minutes. The gallbladder is visualized at 15 minutes. Calculated gallbladder ejection fraction is 72%. (Normal gallbladder ejection fraction with Ensure is greater than 33%.) IMPRESSION: Normal biliary patency study and normal gallbladder ejection fraction. Electronically Signed   By: Marijo Sanes M.D.   On: 08/05/2019 17:52      Armandina Gemma 08/07/2019  Patient ID: Corey Logan, male   DOB: 09-Apr-1956, 63 y.o.   MRN: [id number]

## 2019-08-07 NOTE — Progress Notes (Signed)
PROGRESS NOTE    Corey Logan  JAS:[id number] DOB: 1956/01/23 DOA: 08/04/2019 PCP: [name], MD   Brief Narrative:  Corey Logan is a 63 y.o. male with medical history significant of arthritis, depression, GERD, hiatal hernia, hypertension presenting with a chief complaint of right upper quadrant abdominal pain.  History limited as patient appears uncomfortable due to pain.  Reports having acute onset right upper quadrant abdominal pain this afternoon.  The pain has been constant since then and radiates to his right flank region.  Denies chest pain.  He vomited once.  Wife at bedside states patient was told that he had some problem with his gallbladder 4 to 5 years ago and was advised to get his gallbladder removed at that time.  Wife states patient does sometimes experience right upper quadrant abdominal pain but today the pain is significantly worse.  ED Course: Hemodynamically stable.  No fever.  White blood cell count 15.7.  BUN 29, creatinine 1.3. Baseline creatinine 0.9-1.0.  LFTs normal.  High-sensitivity troponin x2 negative.  EKG without acute ischemic changes.  UA not suggestive of infection.  SARS-CoV-2 test negative.  Chest x-ray showing no active disease.  CT renal stone study without evidence of renal/ureteral stones or obstructive uropathy.  CT showing gallbladder wall thickening, trace amount of ascites adjacent to the liver and minimally adjacent to the gallbladder, and no gallstones.  Findings thought to be suspicious for acute cholecystitis.  Patient subsequently had right upper quadrant ultrasound done which revealed no evidence of gallstones or other significant abnormality.  Patient received Zofran, ceftriaxone, 1 L fluid bolus, and several doses of Dilaudid.  ED provider discussed case with Dr. Marlou Starks from general surgery who recommended continuing antibiotics and obtaining HIDA scan.  Surgery will consult in a.m.   Assessment & Plan:   Principal Problem:   Right upper  quadrant abdominal pain Active Problems:   Emesis   Elevated serum creatinine   Alcohol dependence (HCC)   HTN (hypertension)  Right upper quadrant pain, severe Patient presenting with acute onset right upper quadrant pain x1 day.  Patient is afebrile; but with elevated WBC count of 15.7.  LFTs within normal limits.  CT renal stone study notable for gallbladder wall thickening and a trace amount of ascites adjacent to the liver without gallstones and no bile duct dilation; suspicious for cholecystitis.  No renal/ureteral stones or obstructive uropathy.  Pancreas unremarkable on CT.  Right upper quadrant ultrasound with no evidence of gallstones or other significant normality with a CBD diameter 3 mm.  HIDA scan shows normal biliary patency study and normal ejection fraction.  General surgery believes symptoms not consistent with gallbladder disease with unrevealing imaging studies and recommend GI evaluation for consideration of EGD, signed off 08/06/2019.  Patient did not tolerate upper GI series on 08/07/2019 due to nausea and persistent vomiting, likely secondary to acute alcohol withdrawal syndrome. --WBC 15.7-->9.1-->9.9-->11.1 --Pain control, IV Dilaudid vs IV Toradol --antiemetics, Protonix 40mg  IV BID --Continue empiric antibiotics with ceftriaxone and metronidazole --Follow CBC daily --Clear liquid diet --GI plans repeat upper GI series once withdrawal symptoms improve  Acute renal failure Creatinine 1.34 on admission with a baseline 0.9-1.1.  Likely prerenal azotemia secondary to dehydration/anorexia.  Kidneys with normal appearance without mass, stones or hydronephrosis noted on admission CT. --Cr 1.34-->1.22-->1.74-->1.23 --Urine sodium 10, urine creatinine 242; FeNa 0.0; consistent with prerenal --Continue IV fluids --Monitor urinary output --Follow renal function daily  EtOH dependence Alcohol withdrawal syndrome Per spouse, patient drinks roughly >  13 beers daily.  Has now  started having signs of alcohol withdrawal with tachycardia, confusion, agitation, and elevated blood pressure, nausea and vomiting --CIWAA protocol; with symptom triggered Ativan --Thiamine, folate, multivitamin  Essential hypertension BP 118/86 this morning, not on any home antihypertensives. --Hydralazine 5mg  IV q4hr prn  GERD: Continue PPI  DVT prophylaxis: SCDs, ambulation, heparin Code Status: Full code Family Communication: Discussed with patient's spouse present at bedside Disposition Plan: Continue inpatient, currently undergoing alcohol withdrawals, GI plans repeat upper GI series once withdrawal symptoms improve, may need EGD versus cholecystectomy.   Consultants:   General surgery - Dr. Fulton Reekoth  [facility] - Dr. Dulce Sellarutlaw  Procedures:  HIDA scan: IMPRESSION: Normal biliary patency study and normal gallbladder ejection fraction.  Antimicrobials:   Ceftriaxone 10/31>>  Metronidazole 10/31>>   Subjective: Patient seen and examined bedside, continues with progressive bilious vomiting and nausea.  Continues with generalized abdominal discomfort.  No likely experiencing start of alcohol withdrawal symptoms with confusion, tachycardia, elevated blood pressure.  Unable to tolerate upper GI series today due to vomiting.  GI recommends waiting until recovers from acute withdrawal symptoms prior to proceeding with repeat upper GI series.  Updated patient spouse who is present at bedside.  No other acute concerns overnight per nursing staff.  Objective: Vitals:   08/06/19 1319 08/06/19 2018 08/07/19 0531 08/07/19 1100  BP: (!) 159/93 (!) 154/89 (!) 151/95   Pulse: 97 (!) 105 (!) 103   Resp: 16 16 16    Temp: 98.2 F (36.8 C) 97.6 F (36.4 C) 99.1 F (37.3 C)   TempSrc: Oral  Oral   SpO2: 95% 96% 97% 97%  Weight:      Height:        Intake/Output Summary (Last 24 hours) at 08/07/2019 1630 Last data filed at 08/07/2019 1522 Gross per 24 hour  Intake 2374.53  ml  Output 2400 ml  Net -25.47 ml   Filed Weights   08/04/19 1608  Weight: 88.5 kg    Examination:  General exam: Mild agitation, confused, uncomfortable appearance, persistent nausea/vomiting Respiratory system: Clear to auscultation. Respiratory effort normal.  Oxygenating well on room air Cardiovascular system: S1 & S2 heard, RRR. No JVD, murmurs, rubs, gallops or clicks. No pedal edema. Gastrointestinal system: Abdomen is slightly distended, tense with right upper quadrant tenderness on palpation.  Tender to palpation epigastrium and bilateral lower quadrants, no organomegaly or masses felt.  Bowel sounds slightly decreased. Central nervous system: Alert, mild confusion Extremities: Symmetric 5 x 5 power. Skin: No rashes, lesions or ulcers Psychiatry: Judgement and insight appear poor.  Slightly heightened mood    Data Reviewed: I have personally reviewed following labs and imaging studies  CBC: Recent Labs  Lab 08/04/19 1629 08/05/19 0542 08/06/19 0522 08/07/19 0517  WBC 15.7* 9.1 9.9 11.1*  NEUTROABS 12.2*  --   --   --   HGB 15.2 16.7 17.4* 15.8  HCT 45.0 48.6 52.1* 48.6  MCV 93.9 93.3 96.5 96.4  PLT 285 325 367 356   Basic Metabolic Panel: Recent Labs  Lab 08/04/19 1629 08/05/19 0542 08/05/19 0716 08/06/19 0522 08/07/19 0517  NA 140  --  141 139 139  K 3.6  --  4.3 4.2 3.7  CL 107  --  108 108 107  CO2 24  --  20* 19* 22  GLUCOSE 190*  --  167* 186* 158*  BUN 21  --  26* 48* 41*  CREATININE 1.34*  --  1.22 1.74* 1.23  CALCIUM  8.7*  --  8.5* 8.9 8.5*  MG  --  1.8  --   --   --   PHOS  --  2.7  --   --   --    GFR: Estimated Creatinine Clearance: 66.3 mL/min (by C-G formula based on SCr of 1.23 mg/dL). Liver Function Tests: Recent Labs  Lab 08/04/19 1629 08/07/19 0517  AST 19 17  ALT 20 13  ALKPHOS 55 39  BILITOT 0.8 1.0  PROT 7.2 6.3*  ALBUMIN 4.2 2.9*   Recent Labs  Lab 08/07/19 0517  LIPASE 23   No results for input(s): AMMONIA in  the last 168 hours. Coagulation Profile: No results for input(s): INR, PROTIME in the last 168 hours. Cardiac Enzymes: No results for input(s): CKTOTAL, CKMB, CKMBINDEX, TROPONINI in the last 168 hours. BNP (last 3 results) No results for input(s): PROBNP in the last 8760 hours. HbA1C: No results for input(s): HGBA1C in the last 72 hours. CBG: No results for input(s): GLUCAP in the last 168 hours. Lipid Profile: No results for input(s): CHOL, HDL, LDLCALC, TRIG, CHOLHDL, LDLDIRECT in the last 72 hours. Thyroid Function Tests: No results for input(s): TSH, T4TOTAL, FREET4, T3FREE, THYROIDAB in the last 72 hours. Anemia Panel: No results for input(s): VITAMINB12, FOLATE, FERRITIN, TIBC, IRON, RETICCTPCT in the last 72 hours. Sepsis Labs: No results for input(s): PROCALCITON, LATICACIDVEN in the last 168 hours.  Recent Results (from the past 240 hour(s))  SARS Coronavirus 2 by RT PCR (hospital order, performed in [facility] hospital lab) Nasopharyngeal Nasopharyngeal Swab     Status: None   Collection Time: 08/04/19  8:26 PM   Specimen: Nasopharyngeal Swab  Result Value Ref Range Status   SARS Coronavirus 2 NEGATIVE NEGATIVE Final    Comment: (NOTE) If result is NEGATIVE SARS-CoV-2 target nucleic acids are NOT DETECTED. The SARS-CoV-2 RNA is generally detectable in upper and lower  respiratory specimens during the acute phase of infection. The lowest  concentration of SARS-CoV-2 viral copies this assay can detect is 250  copies / mL. A negative result does not preclude SARS-CoV-2 infection  and should not be used as the sole basis for treatment or other  patient management decisions.  A negative result may occur with  improper specimen collection / handling, submission of specimen other  than nasopharyngeal swab, presence of viral mutation(s) within the  areas targeted by this assay, and inadequate number of viral copies  (<250 copies / mL). A negative result must be combined with  clinical  observations, patient history, and epidemiological information. If result is POSITIVE SARS-CoV-2 target nucleic acids are DETECTED. The SARS-CoV-2 RNA is generally detectable in upper and lower  respiratory specimens dur ing the acute phase of infection.  Positive  results are indicative of active infection with SARS-CoV-2.  Clinical  correlation with patient history and other diagnostic information is  necessary to determine patient infection status.  Positive results do  not rule out bacterial infection or co-infection with other viruses. If result is PRESUMPTIVE POSTIVE SARS-CoV-2 nucleic acids MAY BE PRESENT.   A presumptive positive result was obtained on the submitted specimen  and confirmed on repeat testing.  While 2019 novel coronavirus  (SARS-CoV-2) nucleic acids may be present in the submitted sample  additional confirmatory testing may be necessary for epidemiological  and / or clinical management purposes  to differentiate between  SARS-CoV-2 and other Sarbecovirus currently known to infect humans.  If clinically indicated additional testing with an alternate test  methodology [phone]) is advised. The SARS-CoV-2 RNA is generally  detectable in upper and lower respiratory sp ecimens during the acute  phase of infection. The expected result is Negative. Fact Sheet for Patients:  BoilerBrush.com.cy Fact Sheet for Healthcare Providers: https://pope.com/ This test is not yet approved or cleared by the [place] FDA and has been authorized for detection and/or diagnosis of SARS-CoV-2 by FDA under an Emergency Use Authorization (EUA).  This EUA will remain in effect (meaning this test can be used) for the duration of the COVID-19 declaration under Section 564(b)(1) of the Act, 21 U.S.C. section 360bbb-3(b)(1), unless the authorization is terminated or revoked sooner. Performed at [facility],  [address] [place], [place] [address]          Radiology Studies: Nm Hepato W/eject Fract  Result Date: 08/05/2019 CLINICAL DATA:  Severe right upper quadrant abdominal pain and vomiting. EXAM: NUCLEAR MEDICINE HEPATOBILIARY IMAGING WITH GALLBLADDER EF TECHNIQUE: Sequential images of the abdomen were obtained out to 60 minutes following intravenous administration of radiopharmaceutical. After oral ingestion of Ensure, gallbladder ejection fraction was determined. At 60 min, normal ejection fraction is greater than 33%. RADIOPHARMACEUTICALS:  5.2 mCi Tc-64m  Choletec IV COMPARISON:  CT scan and ultrasound examination 08/04/2019 FINDINGS: There is prompt symmetric uptake in the liver and prompt excretion into the biliary tree which is visualized by 5 minutes. Activity is seen in the small bowel by 10 minutes. The gallbladder is visualized at 15 minutes. Calculated gallbladder ejection fraction is 72%. (Normal gallbladder ejection fraction with Ensure is greater than 33%.) IMPRESSION: Normal biliary patency study and normal gallbladder ejection fraction. Electronically Signed   By: Rudie Meyer M.D.   On: 08/05/2019 17:52   Dg Kayleen Memos W Single Cm (sol Or Thin Ba)  Result Date: 08/07/2019 CLINICAL DATA:  Abdominal pain and vomiting EXAM: UPPER GI SERIES WITHOUT KUB TECHNIQUE: Routine upper GI series was performed with thin barium. FLUOROSCOPY TIME:  Fluoroscopy Time:  2 minutes, 24 seconds Radiation Exposure Index (if provided by the fluoroscopic device): 22.8 mGy Number of Acquired Spot Images: 0 COMPARISON:  CT scan 08/04/2019 FINDINGS: The patient was somnolent and only mildly responsive during the exam. Just prior to commencing the exam he experienced considerable vomiting in the fluoroscopy room. After cleaning the emesis, the patient was placed in the LPO position and encouraged to take swallows of thin barium contrast medium. I was careful with the oral administration in order to attempt to  prevent aspiration in this patient who is not fully responsive. He was able to take small swallows of contrast medium on request. Piecemeal swallowing of the contrast resulted in small boluses traversing the esophagus. On series 6, the patient was able to perform a larger swallow. There is a moderate-sized hiatal hernia. Intermittent reflux of contrast from the hernia back up into the esophagus the level of the cervical esophagus. No appreciable esophageal narrowing or stricture. Contrast medium primarily accumulated in the hiatal hernia although a small amount did extend into the intra-portion of the stomach PICC. There is some mild gaseous prominence the intra-portion of the stomach. I was able to turn the patient into the right lateral decubitus position and cause some contrast from the stomach to down down into the mildly distended duodenum. This filled about the level of the transverse duodenum. They are mildly dilated loops of proximal small bowel measuring up to 3.0 cm in diameter, including the duodenum. I was not able to coat the stomach well due  to the patient's inability to assume prone positions and turn. IMPRESSION: 1. The patient is semiconscious and experienced vomiting during today's exam along with multiple episodes of reflux. Due to limitations in mobility and concerns I had for potential aspiration, we were not able to perform the typical maneuvers associated with a full upper GI examination. 2. Mildly dilated proximal small bowel loops could be from ileus or obstruction. Mild gaseous distention of the stomach. No findings of gastric outlet obstruction. 3. Moderate sized hiatal hernia. 4. No esophageal stricture is identified. Electronically Signed   By: Gaylyn Rong M.D.   On: 08/07/2019 11:24        Scheduled Meds: . folic acid  1 mg Oral Daily  . multivitamin with minerals  1 tablet Oral Daily  . omeprazole  20 mg Oral Daily  . sodium chloride flush  10-40 mL Intracatheter Q12H   . thiamine  100 mg Oral Daily   Or  . thiamine  100 mg Intravenous Daily   Continuous Infusions: . sodium chloride 250 mL (08/05/19 2050)  . sodium chloride 100 mL/hr at 08/07/19 1003  . cefTRIAXone (ROCEPHIN)  IV Stopped (08/06/19 2300)  . metronidazole 500 mg (08/07/19 1322)     LOS: 3 days    Time spent: 32 minutes spent on chart review, discussion with nursing staff, consultants, updating family and interview/physical exam; more than 50% of that time was spent in counseling and/or coordination of care.    Alvira Philips [place], DO [facility] 08/07/2019, 4:30 PM

## 2019-08-07 NOTE — Progress Notes (Signed)
Patient will be NPO for UGI on 08/07/2019.

## 2019-08-07 NOTE — Progress Notes (Signed)
Subjective: Confused. Vomited during attempted UGI series today.  Objective: Vital signs in last 24 hours: Temp:  [97.6 F (36.4 C)-99.1 F (37.3 C)] 99.1 F (37.3 C) (11/03 0531) Pulse Rate:  [103-105] 103 (11/03 0531) Resp:  [16] 16 (11/03 0531) BP: (151-154)/(89-95) 151/95 (11/03 0531) SpO2:  [96 %-97 %] 97 % (11/03 1100) Weight change:     PE: GEN:  Somnolent, confused ABD:  Soft, no clear tenderness  NEURO:  Confused, not able to answer questions  Lab Results: CBC    Component Value Date/Time   WBC 11.1 (H) 08/07/2019 0517   RBC 5.04 08/07/2019 0517   HGB 15.8 08/07/2019 0517   HCT 48.6 08/07/2019 0517   PLT 356 08/07/2019 0517   MCV 96.4 08/07/2019 0517   MCH 31.3 08/07/2019 0517   MCHC 32.5 08/07/2019 0517   RDW 12.7 08/07/2019 0517   LYMPHSABS 2.5 08/04/2019 1629   MONOABS 0.6 08/04/2019 1629   EOSABS 0.2 08/04/2019 1629   BASOSABS 0.1 08/04/2019 1629   CMP     Component Value Date/Time   NA 139 08/07/2019 0517   K 3.7 08/07/2019 0517   CL 107 08/07/2019 0517   CO2 22 08/07/2019 0517   GLUCOSE 158 (H) 08/07/2019 0517   BUN 41 (H) 08/07/2019 0517   CREATININE 1.23 08/07/2019 0517   CALCIUM 8.5 (L) 08/07/2019 0517   PROT 6.3 (L) 08/07/2019 0517   ALBUMIN 2.9 (L) 08/07/2019 0517   AST 17 08/07/2019 0517   ALT 13 08/07/2019 0517   ALKPHOS 39 08/07/2019 0517   BILITOT 1.0 08/07/2019 0517   GFRNONAA >60 08/07/2019 0517   GFRAA >60 08/07/2019 0517   Assessment:  1.  Right-sided abdominal pain, acute on chronic. 2.  Equivocal findings for gallbladder disease. 3.  New onset confusion; suspect alcohol withdrawal.  Plan:  1.  Treat presumed alcohol withdrawal. 2.  UGI series not adequate; once mental status improves, will need to reorder this test. 3.  Further work-up for abdominal pain on hold pending resolution of patient's altered mental status. 4.  [facility] will revisit in a couple days.   Corey Logan 08/07/2019, 1:51 PM   Cell  [phone] If no answer or after 5 PM call [phone]

## 2019-08-07 NOTE — Progress Notes (Signed)
Large amount bilious emesis at 2100/0100/0500 No po intake prior to vomiting phenergan/zoloft adm with minimal relief

## 2019-08-08 ENCOUNTER — Inpatient Hospital Stay (HOSPITAL_COMMUNITY): Payer: PPO

## 2019-08-08 DIAGNOSIS — R400 Somnolence: Secondary | ICD-10-CM

## 2019-08-08 DIAGNOSIS — E876 Hypokalemia: Secondary | ICD-10-CM

## 2019-08-08 DIAGNOSIS — F10231 Alcohol dependence with withdrawal delirium: Secondary | ICD-10-CM

## 2019-08-08 DIAGNOSIS — R7989 Other specified abnormal findings of blood chemistry: Secondary | ICD-10-CM

## 2019-08-08 LAB — COMPREHENSIVE METABOLIC PANEL
ALT: 11 U/L (ref 0–44)
AST: 12 U/L — ABNORMAL LOW (ref 15–41)
Albumin: 2.6 g/dL — ABNORMAL LOW (ref 3.5–5.0)
Alkaline Phosphatase: 30 U/L — ABNORMAL LOW (ref 38–126)
Anion gap: 8 (ref 5–15)
BUN: 37 mg/dL — ABNORMAL HIGH (ref 8–23)
CO2: 25 mmol/L (ref 22–32)
Calcium: 7.3 mg/dL — ABNORMAL LOW (ref 8.9–10.3)
Chloride: 105 mmol/L (ref 98–111)
Creatinine, Ser: 1.01 mg/dL (ref 0.61–1.24)
GFR calc Af Amer: >60 mL/min (ref >60)
GFR calc non Af Amer: >60 mL/min (ref >60)
Glucose, Bld: 131 mg/dL — ABNORMAL HIGH (ref 70–99)
Potassium: 3.4 mmol/L — ABNORMAL LOW (ref 3.5–5.1)
Sodium: 138 mmol/L (ref 135–145)
Total Bilirubin: 1.3 mg/dL — ABNORMAL HIGH (ref 0.3–1.2)
Total Protein: 5.5 g/dL — ABNORMAL LOW (ref 6.5–8.1)

## 2019-08-08 LAB — RAPID URINE DRUG SCREEN, HOSP PERFORMED
Amphetamines: NOT DETECTED
Barbiturates: NOT DETECTED
Benzodiazepines: POSITIVE — AB
Cocaine: NOT DETECTED
Opiates: NOT DETECTED
Tetrahydrocannabinol: NOT DETECTED

## 2019-08-08 LAB — CBC
HCT: 39.8 % (ref 39.0–52.0)
Hemoglobin: 13.2 g/dL (ref 13.0–17.0)
MCH: 32.2 pg (ref 26.0–34.0)
MCHC: 33.2 g/dL (ref 30.0–36.0)
MCV: 97.1 fL (ref 80.0–100.0)
Platelets: 309 10*3/uL (ref 150–400)
RBC: 4.1 MIL/uL — ABNORMAL LOW (ref 4.22–5.81)
RDW: 12.6 % (ref 11.5–15.5)
WBC: 9.6 10*3/uL (ref 4.0–10.5)
nRBC: 0 % (ref 0.0–0.2)

## 2019-08-08 LAB — MAGNESIUM: Magnesium: 2.5 mg/dL — ABNORMAL HIGH (ref 1.7–2.4)

## 2019-08-08 LAB — PHOSPHORUS: Phosphorus: 2.6 mg/dL (ref 2.5–4.6)

## 2019-08-08 LAB — URINALYSIS, ROUTINE W REFLEX MICROSCOPIC
Bacteria, UA: NONE SEEN
Bilirubin Urine: NEGATIVE
Glucose, UA: NEGATIVE mg/dL
Hgb urine dipstick: NEGATIVE
Ketones, ur: 5 mg/dL — AB
Nitrite: NEGATIVE
Protein, ur: NEGATIVE mg/dL
Specific Gravity, Urine: 1.028 (ref 1.005–1.030)
pH: 5 (ref 5.0–8.0)

## 2019-08-08 LAB — TSH: TSH: 2.405 u[IU]/mL (ref 0.350–4.500)

## 2019-08-08 MED ORDER — THIAMINE HCL 100 MG/ML IJ SOLN: 100.0000 mg | Freq: Every day | INTRAMUSCULAR | Status: DC

## 2019-08-08 MED ORDER — LORAZEPAM 2 MG/ML IJ SOLN: 1.0000 mg | INTRAMUSCULAR | Status: AC | PRN

## 2019-08-08 MED ORDER — PNEUMOCOCCAL VAC POLYVALENT 25 MCG/0.5ML IJ INJ
0.5000 mL | INJECTION | INTRAMUSCULAR | Status: DC
  Filled 2019-08-08: qty 0.5

## 2019-08-08 MED ORDER — LORAZEPAM 1 MG PO TABS: 1.0000 mg | ORAL_TABLET | ORAL | Status: AC | PRN

## 2019-08-08 MED ORDER — POTASSIUM CHLORIDE 10 MEQ/100ML IV SOLN
10.0000 meq | INTRAVENOUS | Status: AC
  Administered 2019-08-08 (×3): 10 meq via INTRAVENOUS
  Filled 2019-08-08 (×3): qty 100

## 2019-08-08 MED ORDER — VITAMIN B-1 100 MG PO TABS: 100.0000 mg | ORAL_TABLET | Freq: Every day | ORAL | Status: DC

## 2019-08-08 MED ORDER — THIAMINE HCL 100 MG/ML IJ SOLN
500.0000 mg | Freq: Three times a day (TID) | INTRAVENOUS | Status: AC
  Administered 2019-08-08 – 2019-08-11 (×9): 500 mg via INTRAVENOUS
  Filled 2019-08-08 (×2): qty 5
  Filled 2019-08-08: qty 4
  Filled 2019-08-08 (×6): qty 5

## 2019-08-08 NOTE — Progress Notes (Signed)
At 1655 went to draw lab work and patient was in route to [place] to update consult on patient's return

## 2019-08-08 NOTE — Progress Notes (Signed)
PROGRESS NOTE    Corey CurbJames K Hitt  JYN:[id number] DOB: 07/03/1956 DOA: 08/04/2019 PCP: Elias Elseeade, Robert, MD   Brief Narrative:  HPI per Dr. John GiovanniVasundhra Rathore on 08/04/2019 Corey Logan is a 63 y.o. male with medical history significant of arthritis, depression, GERD, hiatal hernia, hypertension presenting with a chief complaint of right upper quadrant abdominal pain.  History limited as patient appears uncomfortable due to pain.  Reports having acute onset right upper quadrant abdominal pain this afternoon.  The pain has been constant since then and radiates to his right flank region.  Denies chest pain.  He vomited once.  Wife at bedside states patient was told that he had some problem with his gallbladder 4 to 5 years ago and was advised to get his gallbladder removed at that time.  Wife states patient does sometimes experience right upper quadrant abdominal pain but today the pain is significantly worse.  ED Course: Hemodynamically stable.  No fever.  White blood cell count 15.7.  BUN 29, creatinine 1.3.  Baseline creatinine 0.9-1.0.  LFTs normal.  High-sensitivity troponin x2 negative.  EKG without acute ischemic changes.  UA not suggestive of infection.  SARS-CoV-2 test negative.  Chest x-ray showing no active disease.  CT renal stone study without evidence of renal/ureteral stones or obstructive uropathy.  CT showing gallbladder wall thickening, trace amount of ascites adjacent to the liver and minimally adjacent to the gallbladder, and no gallstones.  Findings thought to be suspicious for acute cholecystitis.  Patient subsequently had right upper quadrant ultrasound done which revealed no evidence of gallstones or other significant abnormality.  Patient received Zofran, ceftriaxone, 1 L fluid bolus, and several doses of Dilaudid.  ED provider discussed case with Dr. Carolynne Edouardoth from general surgery who recommended continuing antibiotics and obtaining HIDA scan.  Surgery will consult in a.m.  **Interim  History  Continues to be somnolent and was drowsy today and I discussed the case with neurology via the telephone.  Will replace thiamine with high-dose thiamine 100 mg 3 times daily.  We will also check an EEG and head CT and work the patient up as the patient had a fever today of 100.9 and have pancultured the patient.  Continues to be somnolent and EEG and head CT unremarkable will discuss with neurology further for formal consultation  Assessment & Plan:   Principal Problem:   Right upper quadrant abdominal pain Active Problems:   Emesis   Elevated serum creatinine   Alcohol dependence (HCC)   HTN (hypertension)   Alcohol withdrawal delirium, acute, hyperactive (HCC)  Right upper quadrant pain, severe -Patient presenting with acute onset right upper quadrant pain x1 day.   -Patient was afebrile but now spiked a temperature of 100.9;  -Had elevated WBC count of 15.7 and now is improved.   -LFTs within normal limits as AST is 12 and ALT is 11.   -CT renal stone study notable for gallbladder wall thickening and a trace amount of ascites adjacent to the liver without gallstones and no bile duct dilation; suspicious for cholecystitis.  No renal/ureteral stones or obstructive uropathy.  Pancreas unremarkable on CT.   -Right upper quadrant ultrasound with no evidence of gallstones or other significant normality with a CBD diameter 3 mm.   -HIDA scan shows normal biliary patency study and normal ejection fraction.   -General surgery believes symptoms not consistent with gallbladder disease with unrevealing imaging studies and recommend GI evaluation for consideration of EGD -Patient did not tolerate upper GI series on  08/07/2019 due to nausea and persistent vomiting, likely secondary to acute alcohol withdrawal syndrome. -WBC went from 15.7--> 9.1--> 9.9--> 11.1 --> 9.6 -C/w Pain control, IV Dilaudid vs IV Toradol -C/w Antiemetics with IV Zofran and Phenergan; C/w Protonix  IV BID -Continue  empiric antibiotics with IV Cceftriaxone and metronidazole -Follow CBC daily -C/w Clear liquid diet for now and will obtain SLP evaluation  -GI plans repeat upper GI series once withdrawal symptoms improve  Acute Kidney Injury, improving  -Creatinine 1.34 on admission with a baseline 0.9-1.1.   -Likely prerenal azotemia secondary to dehydration/anorexia.   -Kidneys with normal appearance without mass, stones or hydronephrosis noted on admission CT. -Patient's Cr went from 1.34--> 1.22--> 1.74--> 1.23 --> 1.01 -Urine sodium 10, urine creatinine 242; FeNa 0.0; consistent with prerenal -Continued IV fluids with NS at 100 mL/hr and cut rate to 75 mL/hr -Continue Monitor urinary output -Avoid nephrotoxic medications, contrast dyes, hypotension if possible and renally adjust medications if able -Continue to Monitor and Trend and Follow renal function daily by repeating CMP in AM   EtOH Dependence Alcohol Withdrawal Syndrome -Per spouse, patient drinks roughly >13 beers daily.   -Has now started having signs of alcohol withdrawal with tachycardia, confusion, agitation, and elevated blood pressure, nausea and vomiting -C/w CIWA protocol with IV Lorazepam; Actively Withdrawing  -C/w Daily  Thiamine, folate, MVI+ Minerals  -If worsens will consider transfer to SDU/ICU for Precedex   Essential Hypertension -BP 166/89 this morning, not on any home antihypertensives. -C/w Hydralazine  IV q4hr prn  GERD -Continue IV PPI with Pantoprazole 40 mg q12h   Fever -Unclear Eitology but could be from GI and ? GB Disease or possibly Aspiration -Check Blood Cx x2, UA and Urine Cx, and Check CXR; Initial CXR and UA Negative  -TMax was 100.9; Had a WBC of 11.1 yesterday  -C/w Antipyretics with Acetaminophen 650 mg po/RC q6hprn -C/w IVF as above -C/w IV Ceftriaxone 2 g q24h and IV Metronidazole 500 mg q8h   Hyperbilirubinemia -Mild and worsened slightly -T Bili went from 1.0 -> 1.3 -Continue to  Monitor and Trend and C/w IVF Hydration  Hypokalemia -Patient's K+ was this AM was 3.4; Mag Level was 2.5 -Replete with IV KCl 30 mEQ -Continue to Monitor and Replete as Necessary -Repeat CMP in AM   Somnolence and Confusion -? Related to Alcohol Withdrawal and ? Wernicke's  -Discussed with Neurology and will obtain EEG and Head CT -Will replace Thiamine with High Dose IV 500 mg TID x3 Days  -Check UDS and TSH   DVT prophylaxis: SCDs Code Status: FULL CODE Family Communication: No family present at bedside  Disposition Plan: Pending further improvement and evaluation by GI and General Surgery  Consultants:   General Surgery  Gastroenterology   Discussed the case with Neurology Dr. Wilford Corner    Procedures: None  Antimicrobials:  Anti-infectives (From admission, onward)   Start     Dose/Rate Route Frequency Ordered Stop   08/05/19 2100  cefTRIAXone (ROCEPHIN) 2 g in sodium chloride 0.9 % 100 mL IVPB     2 g 200 mL/hr over 30 Minutes Intravenous Every 24 hours 08/04/19 2157     08/04/19 2200  metroNIDAZOLE (FLAGYL) IVPB 500 mg     500 mg 100 mL/hr over 60 Minutes Intravenous Every 8 hours 08/04/19 2157     08/04/19 2115  cefTRIAXone (ROCEPHIN) 2 g in sodium chloride 0.9 % 100 mL IVPB     2 g 200 mL/hr over 30 Minutes Intravenous  Once 08/04/19 2108 08/04/19 2218     Subjective: Seen and examined at bedside and remains somnolent and very drowsy. No nausea or vomiting and was not complaining of Abdominal Pain. No CP. No other concerns or complaints at this time. Still appears to be withdrawing.    Objective: Vitals:   08/07/19 1600 08/07/19 2003 08/08/19 0602 08/08/19 1448  BP:  (!) 158/90 131/69 (!) 166/89  Pulse:  (!) 105 96 99  Resp:  Temp:  98.7 F (37.1 C) 99.5 F (37.5 C) (!) 100.9 F (38.3 C)  TempSrc:  Oral Oral Oral  SpO2: 95% 98% 96% 93%  Weight:      Height:        Intake/Output Summary (Last 24 hours) at 08/08/2019 1556 Last data filed at  08/08/2019 1518 Gross per 24 hour  Intake 2672.08 ml  Output 400 ml  Net 2272.08 ml   Filed Weights   08/04/19 1608  Weight: 88.5 kg   Examination: Physical Exam:  Constitutional: WN/WD overweight Caucasian male in NAD and appears drowsy and somnolent Eyes: Lids and conjunctivae normal, sclerae anicteric  ENMT: External Ears, Nose appear normal. Grossly normal hearing. Mucous membranes are moist. Neck: Appears normal, supple, no cervical masses, normal ROM, no appreciable thyromegaly; no JVD Respiratory: Diminished to auscultation bilaterally, no wheezing, rales, rhonchi or crackles. Normal respiratory effort and patient is not tachypenic. No accessory muscle use.  Cardiovascular: RRR, no murmurs / rubs / gallops. S1 and S2 auscultated. No extremity edema. 2+ pedal pulses. No carotid bruits.  Abdomen: Soft, non-tender,Distended.  Bowel sounds positive x4.  GU: Deferred. Musculoskeletal: No clubbing / cyanosis of digits/nails. No joint deformity upper and lower extremities.  Skin: No rashes, lesions, ulcers on a limited skin evaluation. No induration; Warm and dry.  Neurologic: Extremely drowsy and somnolent and wakes up after physical stimuli.  Psychiatric: Impaired judgment and insight. Drowsy and somnolnet. Normal mood and appropriate affect.   Data Reviewed: I have personally reviewed following labs and imaging studies  CBC: Recent Labs  Lab 08/04/19 1629 08/05/19 0542 08/06/19 0522 08/07/19 0517 08/08/19 0511  WBC 15.7* 9.1 9.9 11.1* 9.6  NEUTROABS 12.2*  --   --   --   --   HGB 15.2 16.7 17.4* 15.8 13.2  HCT 45.0 48.6 52.1* 48.6 39.8  MCV 93.9 93.3 96.5 96.4 97.1  PLT 285 325 367 356 309   Basic Metabolic Panel: Recent Labs  Lab 08/04/19 1629 08/05/19 0542 08/05/19 0716 08/06/19 0522 08/07/19 0517 08/08/19 0511  NA 140  --  141 139 139 138  K 3.6  --  4.3 4.2 3.7 3.4*  CL 107  --  108 108 107 105  CO2 24  --  20* 19* 22 25  GLUCOSE 190*  --  167* 186* 158*  131*  BUN 21  --  26* 48* 41* 37*  CREATININE 1.34*  --  1.22 1.74* 1.23 1.01  CALCIUM 8.7*  --  8.5* 8.9 8.5* 7.3*  MG  --  1.8  --   --   --  2.5*  PHOS  --  2.7  --   --   --  2.6   GFR: Estimated Creatinine Clearance: 80.8 mL/min (by C-G formula based on SCr of 1.01 mg/dL). Liver Function Tests: Recent Labs  Lab 08/04/19 1629 08/07/19 0517 08/08/19 0511  AST 19 17 12*  ALT ALKPHOS 55 39 30*  BILITOT 0.8 1.0 1.3*  PROT  7.2 6.3* 5.5*  ALBUMIN 4.2 2.9* 2.6*   Recent Labs  Lab 08/07/19 0517  LIPASE 23   No results for input(s): AMMONIA in the last 168 hours. Coagulation Profile: No results for input(s): INR, PROTIME in the last 168 hours. Cardiac Enzymes: No results for input(s): CKTOTAL, CKMB, CKMBINDEX, TROPONINI in the last 168 hours. BNP (last 3 results) No results for input(s): PROBNP in the last 8760 hours. HbA1C: No results for input(s): HGBA1C in the last 72 hours. CBG: No results for input(s): GLUCAP in the last 168 hours. Lipid Profile: No results for input(s): CHOL, HDL, LDLCALC, TRIG, CHOLHDL, LDLDIRECT in the last 72 hours. Thyroid Function Tests: No results for input(s): TSH, T4TOTAL, FREET4, T3FREE, THYROIDAB in the last 72 hours. Anemia Panel: No results for input(s): VITAMINB12, FOLATE, FERRITIN, TIBC, IRON, RETICCTPCT in the last 72 hours. Sepsis Labs: No results for input(s): PROCALCITON, LATICACIDVEN in the last 168 hours.  Recent Results (from the past 240 hour(s))  SARS Coronavirus 2 by RT PCR (hospital order, performed in [facility] hospital lab) Nasopharyngeal Nasopharyngeal Swab     Status: None   Collection Time: 08/04/19  8:26 PM   Specimen: Nasopharyngeal Swab  Result Value Ref Range Status   SARS Coronavirus 2 NEGATIVE NEGATIVE Final    Comment: (NOTE) If result is NEGATIVE SARS-CoV-2 target nucleic acids are NOT DETECTED. The SARS-CoV-2 RNA is generally detectable in upper and lower  respiratory specimens during the  acute phase of infection. The lowest  concentration of SARS-CoV-2 viral copies this assay can detect is 250  copies / mL. A negative result does not preclude SARS-CoV-2 infection  and should not be used as the sole basis for treatment or other  patient management decisions.  A negative result may occur with  improper specimen collection / handling, submission of specimen other  than nasopharyngeal swab, presence of viral mutation(s) within the  areas targeted by this assay, and inadequate number of viral copies  (<250 copies / mL). A negative result must be combined with clinical  observations, patient history, and epidemiological information. If result is POSITIVE SARS-CoV-2 target nucleic acids are DETECTED. The SARS-CoV-2 RNA is generally detectable in upper and lower  respiratory specimens dur ing the acute phase of infection.  Positive  results are indicative of active infection with SARS-CoV-2.  Clinical  correlation with patient history and other diagnostic information is  necessary to determine patient infection status.  Positive results do  not rule out bacterial infection or co-infection with other viruses. If result is PRESUMPTIVE POSTIVE SARS-CoV-2 nucleic acids MAY BE PRESENT.   A presumptive positive result was obtained on the submitted specimen  and confirmed on repeat testing.  While 2019 novel coronavirus  (SARS-CoV-2) nucleic acids may be present in the submitted sample  additional confirmatory testing may be necessary for epidemiological  and / or clinical management purposes  to differentiate between  SARS-CoV-2 and other Sarbecovirus currently known to infect humans.  If clinically indicated additional testing with an alternate test  methodology [phone]) is advised. The SARS-CoV-2 RNA is generally  detectable in upper and lower respiratory sp ecimens during the acute  phase of infection. The expected result is Negative. Fact Sheet for Patients:   BoilerBrush.com.cy Fact Sheet for Healthcare Providers: https://pope.com/ This test is not yet approved or cleared by the [place] FDA and has been authorized for detection and/or diagnosis of SARS-CoV-2 by FDA under an Emergency Use Authorization (EUA).  This EUA will remain in effect (meaning  this test can be used) for the duration of the COVID-19 declaration under Section 564(b)(1) of the Act, 21 U.S.C. section 360bbb-3(b)(1), unless the authorization is terminated or revoked sooner. Performed at [facility], [place] [address], [place], [place] [address]    Radiology Studies: Dg Ugi W Single Cm (sol Or Thin Ba)  Result Date: 08/07/2019 CLINICAL DATA:  Abdominal pain and vomiting EXAM: UPPER GI SERIES WITHOUT KUB TECHNIQUE: Routine upper GI series was performed with thin barium. FLUOROSCOPY TIME:  Fluoroscopy Time:  2 minutes, 24 seconds Radiation Exposure Index (if provided by the fluoroscopic device): 22.8 mGy Number of Acquired Spot Images: 0 COMPARISON:  CT scan 08/04/2019 FINDINGS: The patient was somnolent and only mildly responsive during the exam. Just prior to commencing the exam he experienced considerable vomiting in the fluoroscopy room. After cleaning the emesis, the patient was placed in the LPO position and encouraged to take swallows of thin barium contrast medium. I was careful with the oral administration in order to attempt to prevent aspiration in this patient who is not fully responsive. He was able to take small swallows of contrast medium on request. Piecemeal swallowing of the contrast resulted in small boluses traversing the esophagus. On series 6, the patient was able to perform a larger swallow. There is a moderate-sized hiatal hernia. Intermittent reflux of contrast from the hernia back up into the esophagus the level of the cervical esophagus. No appreciable esophageal narrowing or stricture. Contrast  medium primarily accumulated in the hiatal hernia although a small amount did extend into the intra-portion of the stomach PICC. There is some mild gaseous prominence the intra-portion of the stomach. I was able to turn the patient into the right lateral decubitus position and cause some contrast from the stomach to down down into the mildly distended duodenum. This filled about the level of the transverse duodenum. They are mildly dilated loops of proximal small bowel measuring up to 3.0 cm in diameter, including the duodenum. I was not able to coat the stomach well due to the patient's inability to assume prone positions and turn. IMPRESSION: 1. The patient is semiconscious and experienced vomiting during today's exam along with multiple episodes of reflux. Due to limitations in mobility and concerns I had for potential aspiration, we were not able to perform the typical maneuvers associated with a full upper GI examination. 2. Mildly dilated proximal small bowel loops could be from ileus or obstruction. Mild gaseous distention of the stomach. No findings of gastric outlet obstruction. 3. Moderate sized hiatal hernia. 4. No esophageal stricture is identified. Electronically Signed   By: Van Clines M.D.   On: 08/07/2019 11:24   Scheduled Meds: . folic acid  1 mg Oral Daily  . multivitamin with minerals  1 tablet Oral Daily  . pantoprazole (PROTONIX) IV  40 mg Intravenous Q12H  . [START ON 08/09/2019] pneumococcal 23 valent vaccine  0.5 mL Intramuscular Tomorrow-1000  . sodium chloride flush  10-40 mL Intracatheter Q12H  . thiamine  100 mg Oral Daily   Or  . thiamine  100 mg Intravenous Daily   Continuous Infusions: . sodium chloride 250 mL (08/05/19 2050)  . sodium chloride 100 mL/hr at 08/08/19 1008  . cefTRIAXone (ROCEPHIN)  IV 2 g (08/07/19 2047)  . metronidazole Stopped (08/08/19 1436)    LOS: 4 days   Kerney Elbe, DO [facility] PAGER is on [facility]  If 7PM-7AM, please  contact night-coverage [email or web address] Password [facility] 08/08/2019, 3:56 PM

## 2019-08-08 NOTE — Progress Notes (Signed)
Attempted DBIV from midline. No blood return after troubleshooting. Phlebotomist notified of need for venipuncture.

## 2019-08-08 NOTE — Progress Notes (Signed)
    CC: Abdominal pain  Subjective: Patient is sedated this a.m.  It took several minutes to wake him up.  His speech is thick and not very clear.  I spent a long time pressing on his abdomen and he denies any abdominal pain at all today.  Yesterday his pain was in the lower abdomen, and he was asking for a beer.  Objective: Vital signs in last 24 hours: Temp:  [98.7 F (37.1 C)-99.5 F (37.5 C)] 99.5 F (37.5 C) (11/04 0602) Pulse Rate:  [96-105] 96 (11/04 0602) Resp:  [18] 18 (11/04 0602) BP: (131-158)/(69-90) 131/69 (11/04 0602) SpO2:  [95 %-98 %] 96 % (11/04 0602)  N.p.o. 1842 IV 400 urine recorded 1500 emesis No BM recorded. Afebrile hypertensive most of yesterday with some mild tachycardia. On O2 at 3 L.  Sats 95 to 98% Labs yesterday showed a creatinine of 1.23, glucose of 158, WBC of 11.1. CBC today shows WBC 9.6, hemoglobin 13.2, hematocrit 39.8.  Platelets 309,000   Intake/Output from previous day: 11/03 0701 - 11/04 0700 In: 1842 [I.V.:1503.1; IV Piggyback:338.9] Out: 1900 [Urine:400; Emesis/NG output:1500] Intake/Output this shift: No intake/output data recorded.  General appearance: Patient is sedated, he was rather hard to wake up.  His speech is thickened, difficult to understand and he is somewhat confused. Resp: clear to auscultation bilaterally GI: This a.m. his abdomen is soft nontender, some distention, few bowel sounds no BM recorded.  Lab Results:  Recent Labs    08/07/19 0517 08/08/19 0511  WBC 11.1* 9.6  HGB 15.8 13.2  HCT 48.6 39.8  PLT 356 309    BMET Recent Labs    08/06/19 0522 08/07/19 0517  NA 139 139  K 4.2 3.7  CL 108 107  CO2 19* 22  GLUCOSE 186* 158*  BUN 48* 41*  CREATININE 1.74* 1.23  CALCIUM 8.9 8.5*   PT/INR No results for input(s): LABPROT, INR in the last 72 hours.  Recent Labs  Lab 08/04/19 1629 08/07/19 0517  AST 19 17  ALT 20 13  ALKPHOS 55 39  BILITOT 0.8 1.0  PROT 7.2 6.3*  ALBUMIN 4.2 2.9*      Lipase     Component Value Date/Time   LIPASE 23 08/07/2019 0517     Medications: . folic acid  1 mg Oral Daily  . multivitamin with minerals  1 tablet Oral Daily  . pantoprazole (PROTONIX) IV  40 mg Intravenous Q12H  . sodium chloride flush  10-40 mL Intracatheter Q12H  . thiamine  100 mg Oral Daily   Or  . thiamine  100 mg Intravenous Daily    Assessment/Plan ETOH dependence > 13 beers per day EtOH withdrawal - CIWA protocol HTN GERD AKI  Abdominal pain Nausea and vomiting -Abdominal ultrasound: No gallstones no wall thickening common        bile duct 3 mm  -Normal LFTs - HIDA negative for acute cholecystitis - Recommend GI consult for consideration of EGD.  - UGI 11/4 terminated pt semiconscious/vomiting: Preliminary findings include dilated proximal small bowel loops.  Ileus versus obstruction gaseous distention of the stomach, moderate hiatal hernia.  No esophageal stricture.  ID -rocephin/flagyl 10/31>> day 5 FEN -IVF, NPO VTE -SCDs, ambulation Foley -none  Plan: Currently has no abdominal pain.  He is going through alcohol withdrawal.  Still has no evidence for cholecystitis.      LOS: 4 days    Corey Logan 08/08/2019 Please see Amion

## 2019-08-09 ENCOUNTER — Inpatient Hospital Stay (HOSPITAL_COMMUNITY)
Admit: 2019-08-09 | Discharge: 2019-08-09 | Disposition: A | Discharge status: Home / Self Care | Payer: PPO | Attending: Student in an Organized Health Care Education/Training Program | Admitting: Student in an Organized Health Care Education/Training Program

## 2019-08-09 DIAGNOSIS — R4182 Altered mental status, unspecified: Secondary | ICD-10-CM

## 2019-08-09 DIAGNOSIS — I159 Secondary hypertension, unspecified: Secondary | ICD-10-CM

## 2019-08-09 LAB — URINE CULTURE: Culture: NO GROWTH

## 2019-08-09 LAB — CBC WITH DIFFERENTIAL/PLATELET
Abs Immature Granulocytes: 0.07 10*3/uL (ref 0.00–0.07)
Basophils Absolute: 0.1 10*3/uL (ref 0.0–0.1)
Basophils Relative: 1 %
Eosinophils Absolute: 0.3 10*3/uL (ref 0.0–0.5)
Eosinophils Relative: 3 %
HCT: 39.8 % (ref 39.0–52.0)
Hemoglobin: 12.7 g/dL — ABNORMAL LOW (ref 13.0–17.0)
Immature Granulocytes: 1 %
Lymphocytes Relative: 11 %
Lymphs Abs: 1.1 10*3/uL (ref 0.7–4.0)
MCH: 31.4 pg (ref 26.0–34.0)
MCHC: 31.9 g/dL (ref 30.0–36.0)
MCV: 98.5 fL (ref 80.0–100.0)
Monocytes Absolute: 0.6 10*3/uL (ref 0.1–1.0)
Monocytes Relative: 6 %
Neutro Abs: 8.2 10*3/uL — ABNORMAL HIGH (ref 1.7–7.7)
Neutrophils Relative %: 78 %
Platelets: 288 10*3/uL (ref 150–400)
RBC: 4.04 MIL/uL — ABNORMAL LOW (ref 4.22–5.81)
RDW: 12.4 % (ref 11.5–15.5)
WBC: 10.4 10*3/uL (ref 4.0–10.5)
nRBC: 0 % (ref 0.0–0.2)

## 2019-08-09 LAB — COMPREHENSIVE METABOLIC PANEL
ALT: 11 U/L (ref 0–44)
AST: 12 U/L — ABNORMAL LOW (ref 15–41)
Albumin: 2.5 g/dL — ABNORMAL LOW (ref 3.5–5.0)
Alkaline Phosphatase: 31 U/L — ABNORMAL LOW (ref 38–126)
Anion gap: 9 (ref 5–15)
BUN: 23 mg/dL (ref 8–23)
CO2: 21 mmol/L — ABNORMAL LOW (ref 22–32)
Calcium: 7.6 mg/dL — ABNORMAL LOW (ref 8.9–10.3)
Chloride: 112 mmol/L — ABNORMAL HIGH (ref 98–111)
Creatinine, Ser: 0.94 mg/dL (ref 0.61–1.24)
GFR calc Af Amer: >60 mL/min (ref >60)
GFR calc non Af Amer: >60 mL/min (ref >60)
Glucose, Bld: 163 mg/dL — ABNORMAL HIGH (ref 70–99)
Potassium: 2.9 mmol/L — ABNORMAL LOW (ref 3.5–5.1)
Sodium: 142 mmol/L (ref 135–145)
Total Bilirubin: 0.9 mg/dL (ref 0.3–1.2)
Total Protein: 5.3 g/dL — ABNORMAL LOW (ref 6.5–8.1)

## 2019-08-09 LAB — PHOSPHORUS: Phosphorus: 2.5 mg/dL (ref 2.5–4.6)

## 2019-08-09 LAB — MAGNESIUM: Magnesium: 2.3 mg/dL (ref 1.7–2.4)

## 2019-08-09 MED ORDER — LORAZEPAM 1 MG PO TABS: 1.0000 mg | ORAL_TABLET | ORAL | Status: AC | PRN

## 2019-08-09 MED ORDER — LORAZEPAM 2 MG/ML IJ SOLN: 1.0000 mg | INTRAMUSCULAR | Status: AC | PRN

## 2019-08-09 MED ORDER — POTASSIUM CHLORIDE 10 MEQ/100ML IV SOLN
10.0000 meq | INTRAVENOUS | Status: AC
  Administered 2019-08-09 (×3): 10 meq via INTRAVENOUS
  Filled 2019-08-09 (×3): qty 100

## 2019-08-09 MED ORDER — POTASSIUM CHLORIDE IN NACL 40-0.9 MEQ/L-% IV SOLN
INTRAVENOUS | Status: AC
  Administered 2019-08-09: 75 mL/h via INTRAVENOUS
  Filled 2019-08-09: qty 1000
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
## 2019-08-09 NOTE — Procedures (Signed)
Patient Name: Corey Logan  MRN: [id number]  Epilepsy Attending: Lora Havens  Referring Physician/Provider: Dr Baird Kay Date: 08/09/2019 Duration: 23.51 minutes  Patient history: 63 year old male with altered mental status.  EEG to evaluate for seizures.  Level of alertness: Awake  AEDs during EEG study: None  Technical aspects: This EEG study was done with scalp electrodes positioned according to the 10-20 International system of electrode placement. Electrical activity was acquired at a sampling rate of 500Hz  and reviewed with a high frequency filter of 70Hz  and a low frequency filter of 1Hz . EEG data were recorded continuously and digitally stored.   Description: EEG showed continuous generalized 2 to 5 Hz theta-delta slowing.  No clear posterior dominant rhythm was seen.  Hyperventilation and photic stimulation were deferred.  Abnormality Continuous slow, generalized  IMPRESSION: This study is suggestive of mild to moderate diffuse encephalopathy, nonspecific etiology. No seizures or epileptiform discharges were seen throughout the recording.  Hesham Womac Barbra Sarks
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
## 2019-08-09 NOTE — Progress Notes (Signed)
Subjective: No abdominal pain.  Objective: Vital signs in last 24 hours: Temp:  [99.1 F (37.3 C)-99.6 F (37.6 C)] 99.6 F (37.6 C) (11/05 1325) Pulse Rate:  [67-102] 96 (11/05 1325) Resp:  [16-20] 20 (11/05 1325) BP: (156-169)/(86-88) 156/87 (11/05 1325) SpO2:  [94 %-95 %] 95 % (11/05 1325) Weight change:  Last BM Date: 08/08/19  PE: GEN:  More alert, NAD ABD:  Soft, non-tender  Lab Results: CBC    Component Value Date/Time   WBC 10.4 08/09/2019 0538   RBC 4.04 (L) 08/09/2019 0538   HGB 12.7 (L) 08/09/2019 0538   HCT 39.8 08/09/2019 0538   PLT 288 08/09/2019 0538   MCV 98.5 08/09/2019 0538   MCH 31.4 08/09/2019 0538   MCHC 31.9 08/09/2019 0538   RDW 12.4 08/09/2019 0538   LYMPHSABS 1.1 08/09/2019 0538   MONOABS 0.6 08/09/2019 0538   EOSABS 0.3 08/09/2019 0538   BASOSABS 0.1 08/09/2019 0538   CMP     Component Value Date/Time   NA 142 08/09/2019 0538   K 2.9 (L) 08/09/2019 0538   CL 112 (H) 08/09/2019 0538   CO2 21 (L) 08/09/2019 0538   GLUCOSE 163 (H) 08/09/2019 0538   BUN 23 08/09/2019 0538   CREATININE 0.94 08/09/2019 0538   CALCIUM 7.6 (L) 08/09/2019 0538   PROT 5.3 (L) 08/09/2019 0538   ALBUMIN 2.5 (L) 08/09/2019 0538   AST 12 (L) 08/09/2019 0538   ALT 11 08/09/2019 0538   ALKPHOS 31 (L) 08/09/2019 0538   BILITOT 0.9 08/09/2019 0538   GFRNONAA >60 08/09/2019 0538   GFRAA >60 08/09/2019 0538   Assessment:  1. Right-sided abdominal pain, acute on chronic.  Denies abdominal pain at present. 2. Equivocal findings for gallbladder disease. 3.  Confusion, suspected alcohol withdrawal, significantly improved.  Plan:  1.   UGI series tomorrow. 2.  Advance diet. 3.  Alcohol cessation. 4.  If does ok with advancing diet and UGI unrevealing tomorrow, patient would be ok to be discharged tomorrow from GI perspective with outpatient GI follow-up. 5.  [facility] will follow.   Landry Dyke 08/09/2019, 4:02 PM   Cell [phone] If no answer or  after 5 PM call [phone]

## 2019-08-09 NOTE — Progress Notes (Signed)
EEG completed, results pending. 

## 2019-08-10 ENCOUNTER — Inpatient Hospital Stay (HOSPITAL_COMMUNITY): Payer: PPO

## 2019-08-10 DIAGNOSIS — R509 Fever, unspecified: Secondary | ICD-10-CM

## 2019-08-10 DIAGNOSIS — R197 Diarrhea, unspecified: Secondary | ICD-10-CM

## 2019-08-10 LAB — COMPREHENSIVE METABOLIC PANEL
ALT: 12 U/L (ref 0–44)
AST: 13 U/L — ABNORMAL LOW (ref 15–41)
Albumin: 2.5 g/dL — ABNORMAL LOW (ref 3.5–5.0)
Alkaline Phosphatase: 32 U/L — ABNORMAL LOW (ref 38–126)
Anion gap: 11 (ref 5–15)
BUN: 14 mg/dL (ref 8–23)
CO2: 23 mmol/L (ref 22–32)
Calcium: 7.8 mg/dL — ABNORMAL LOW (ref 8.9–10.3)
Chloride: 109 mmol/L (ref 98–111)
Creatinine, Ser: 0.77 mg/dL (ref 0.61–1.24)
GFR calc Af Amer: >60 mL/min (ref >60)
GFR calc non Af Amer: >60 mL/min (ref >60)
Glucose, Bld: 107 mg/dL — ABNORMAL HIGH (ref 70–99)
Potassium: 3.2 mmol/L — ABNORMAL LOW (ref 3.5–5.1)
Sodium: 143 mmol/L (ref 135–145)
Total Bilirubin: 1.1 mg/dL (ref 0.3–1.2)
Total Protein: 5.3 g/dL — ABNORMAL LOW (ref 6.5–8.1)

## 2019-08-10 LAB — CBC WITH DIFFERENTIAL/PLATELET
Abs Immature Granulocytes: 0.09 10*3/uL — ABNORMAL HIGH (ref 0.00–0.07)
Basophils Absolute: 0 10*3/uL (ref 0.0–0.1)
Basophils Relative: 0 %
Eosinophils Absolute: 0.5 10*3/uL (ref 0.0–0.5)
Eosinophils Relative: 5 %
HCT: 38.6 % — ABNORMAL LOW (ref 39.0–52.0)
Hemoglobin: 12.6 g/dL — ABNORMAL LOW (ref 13.0–17.0)
Immature Granulocytes: 1 %
Lymphocytes Relative: 14 %
Lymphs Abs: 1.3 10*3/uL (ref 0.7–4.0)
MCH: 31.5 pg (ref 26.0–34.0)
MCHC: 32.6 g/dL (ref 30.0–36.0)
MCV: 96.5 fL (ref 80.0–100.0)
Monocytes Absolute: 0.7 10*3/uL (ref 0.1–1.0)
Monocytes Relative: 7 %
Neutro Abs: 7.1 10*3/uL (ref 1.7–7.7)
Neutrophils Relative %: 73 %
Platelets: 261 10*3/uL (ref 150–400)
RBC: 4 MIL/uL — ABNORMAL LOW (ref 4.22–5.81)
RDW: 12 % (ref 11.5–15.5)
WBC: 9.7 10*3/uL (ref 4.0–10.5)
nRBC: 0 % (ref 0.0–0.2)

## 2019-08-10 LAB — MAGNESIUM: Magnesium: 2.3 mg/dL (ref 1.7–2.4)

## 2019-08-10 LAB — PHOSPHORUS: Phosphorus: 3.3 mg/dL (ref 2.5–4.6)

## 2019-08-10 MED ORDER — POTASSIUM CHLORIDE 10 MEQ/100ML IV SOLN
10.0000 meq | INTRAVENOUS | Status: AC
  Administered 2019-08-10 (×3): 10 meq via INTRAVENOUS
  Filled 2019-08-10 (×3): qty 100

## 2019-08-10 MED ORDER — POTASSIUM CHLORIDE CRYS ER 20 MEQ PO TBCR
40.0000 meq | EXTENDED_RELEASE_TABLET | Freq: Two times a day (BID) | ORAL | Status: AC
  Administered 2019-08-10 (×2): 40 meq via ORAL
  Filled 2019-08-10 (×2): qty 2

## 2019-08-10 MED ORDER — PANTOPRAZOLE SODIUM 40 MG PO TBEC
40.0000 mg | DELAYED_RELEASE_TABLET | Freq: Every day | ORAL | Status: DC
  Administered 2019-08-11: 40 mg via ORAL
  Filled 2019-08-10: qty 1

## 2019-08-10 MED ORDER — PRO-STAT SUGAR FREE PO LIQD
30.0000 mL | Freq: Two times a day (BID) | ORAL | Status: DC
  Administered 2019-08-10: 30 mL via ORAL
  Filled 2019-08-10 (×2): qty 30

## 2019-08-10 MED ORDER — LOPERAMIDE HCL 2 MG PO CAPS
2.0000 mg | ORAL_CAPSULE | ORAL | Status: DC | PRN
  Administered 2019-08-10: 2 mg via ORAL
  Filled 2019-08-10 (×2): qty 1

## 2019-08-10 MED ORDER — BOOST / RESOURCE BREEZE PO LIQD CUSTOM
1.0000 | Freq: Three times a day (TID) | ORAL | Status: DC
  Administered 2019-08-10 – 2019-08-11 (×2): 1 via ORAL

## 2019-08-10 NOTE — Progress Notes (Signed)
Initial Nutrition Assessment  DOCUMENTATION CODES:   Not applicable  INTERVENTION:   - Boost Breeze po TID, each supplement provides 250 kcal and 9 grams of protein  - Pro-stat 30 ml BID, each supplement provides 100 kcal and 15 grams of protein  - Continue MVI with minerals daily  NUTRITION DIAGNOSIS:   Inadequate oral intake related to altered GI function as evidenced by other (clear liquid diet order).  GOAL:   Patient will meet greater than or equal to 90% of their needs  MONITOR:   PO intake, Supplement acceptance, Diet advancement, Labs, Weight trends  REASON FOR ASSESSMENT:   NPO/Clear Liquid Diet    ASSESSMENT:   63 year old male who presented to the ED on 10/31 with abdominal pain. PMH of HTN, depression, GERD, hiatal hernia, EtOH abuse.   11/01 - HIDA negative for acute cholecystitis 11/05 - SLP evaluation with recommendations for Regular diet with thin liquids 11/06 - s/p UGI series  Spoke with pt at bedside. Pt reports being ready to leave. Pt states that his abdomen feels fine today and denies any pain or nausea.  Pt states that he has been tolerating [place] ice, broth, and juice but knows he is not getting enough. Pt states he may have lost some weight during this admission.  Pt reports that he typically has a great appetite and eats at least 3 meals daily. Meals include chicken, fish, steak, etc. Pt denies any weight changes recently and reports his UBW as 190 lbs. Weight history in chart is limited as last available weights PTA are from 2017.  Pt amenable to RD ordering supplements while he is on clear liquid diet.  Medications reviewed and include: folic acid, MVI, Protonix, Klor-con, IV thiamine, IV abx  Labs reviewed: potassium 3.2  UOP: 1275 ml x 24 hours I/O's: +1.9 L since admit  NUTRITION - FOCUSED PHYSICAL EXAM:    Most Recent Value  Orbital Region  No depletion  Upper Arm Region  No depletion  Thoracic and Lumbar Region  No  depletion  Buccal Region  No depletion  Temple Region  No depletion  Clavicle Bone Region  No depletion  Clavicle and Acromion Bone Region  No depletion  Scapular Bone Region  No depletion  Dorsal Hand  No depletion  Patellar Region  No depletion  Anterior Thigh Region  Mild depletion  Posterior Calf Region  Mild depletion  Edema (RD Assessment)  None  Hair  Reviewed  Eyes  Reviewed  Mouth  Reviewed  Skin  Reviewed  Nails  Reviewed       Diet Order:   Diet Order            Diet clear liquid Room service appropriate? Yes; Fluid consistency: Thin  Diet effective now              EDUCATION NEEDS:   No education needs have been identified at this time  Skin:  Skin Assessment: Reviewed RN Assessment  Last BM:  08/10/19  Height:   Ht Readings from Last 1 Encounters:  08/04/19 5\' 11"  (1.803 m)    Weight:   Wt Readings from Last 1 Encounters:  08/04/19 88.5 kg    Ideal Body Weight:  78.2 kg  BMI:  Body mass index is 27.2 kg/m.  Estimated Nutritional Needs:   Kcal:  2000-2200  Protein:  100-115 grams  Fluid:  >/= 2.0 L    Gaynell Face, MS, RD, LDN Inpatient Clinical Dietitian Pager: [phone] Weekend/After Hours: [phone]
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
## 2019-08-10 NOTE — Progress Notes (Signed)
Subjective: Abdominal pain resolved.  Objective: Vital signs in last 24 hours: Temp:  [98.8 F (37.1 C)-99.9 F (37.7 C)] 98.8 F (37.1 C) (11/06 1339) Pulse Rate:  [87-95] 87 (11/06 1339) Resp:  [18-20] 18 (11/06 1339) BP: (166-186)/(79-88) 166/88 (11/06 1339) SpO2:  [94 %-96 %] 96 % (11/06 1339) Weight change:  Last BM Date: 08/10/19  PE: GEN:  NAD ABD:  Soft, non-tender  Lab Results: CBC    Component Value Date/Time   WBC 9.7 08/10/2019 0540   RBC 4.00 (L) 08/10/2019 0540   HGB 12.6 (L) 08/10/2019 0540   HCT 38.6 (L) 08/10/2019 0540   PLT 261 08/10/2019 0540   MCV 96.5 08/10/2019 0540   MCH 31.5 08/10/2019 0540   MCHC 32.6 08/10/2019 0540   RDW 12.0 08/10/2019 0540   LYMPHSABS 1.3 08/10/2019 0540   MONOABS 0.7 08/10/2019 0540   EOSABS 0.5 08/10/2019 0540   BASOSABS 0.0 08/10/2019 0540   CMP     Component Value Date/Time   NA 143 08/10/2019 0540   K 3.2 (L) 08/10/2019 0540   CL 109 08/10/2019 0540   CO2 23 08/10/2019 0540   GLUCOSE 107 (H) 08/10/2019 0540   BUN 14 08/10/2019 0540   CREATININE 0.77 08/10/2019 0540   CALCIUM 7.8 (L) 08/10/2019 0540   PROT 5.3 (L) 08/10/2019 0540   ALBUMIN 2.5 (L) 08/10/2019 0540   AST 13 (L) 08/10/2019 0540   ALT 12 08/10/2019 0540   ALKPHOS 32 (L) 08/10/2019 0540   BILITOT 1.1 08/10/2019 0540   GFRNONAA >60 08/10/2019 0540   GFRAA >60 08/10/2019 0540   Assessment:  1. Right-sided abdominal pain, acute on chronic.  Denies abdominal pain at present.  UGI series small hiatal hernia and mild esophageal dysmotility but otherwise unrevealing. 2. Equivocal findings for gallbladder disease. 3.  Confusion, suspected alcohol withdrawal, significantly improved. 4.  Alcohol abuse.  Plan:  1.  Pantoprazole 40 mg po qd qac, now and upon discharge. 2.  Advance diet as tolerated. 3.  Alcohol cessation counseling as outpatient advised. 4.  OK to discharge home from GI perspective, can follow up with [place] in [facility] as  outpatient; and based on his clinical course at that point, we can decide whether or not further referral to surgery for consideration of cholecystectomy is warranted. 5.  [facility] will sign-off; please call with questions; thank you for the consultation; case reviewed with Dr. Alfredia Ferguson of [facility].  I reviewed findings and recommendations with patient's wife, who is at the bedside.   Corey Logan 08/10/2019, 2:48 PM   Cell [phone] If no answer or after 5 PM call [phone]

## 2019-08-11 LAB — BASIC METABOLIC PANEL
Anion gap: 11 (ref 5–15)
BUN: 13 mg/dL (ref 8–23)
CO2: 21 mmol/L — ABNORMAL LOW (ref 22–32)
Calcium: 8 mg/dL — ABNORMAL LOW (ref 8.9–10.3)
Chloride: 106 mmol/L (ref 98–111)
Creatinine, Ser: 0.84 mg/dL (ref 0.61–1.24)
GFR calc Af Amer: >60 mL/min (ref >60)
GFR calc non Af Amer: >60 mL/min (ref >60)
Glucose, Bld: 104 mg/dL — ABNORMAL HIGH (ref 70–99)
Potassium: 3.8 mmol/L (ref 3.5–5.1)
Sodium: 138 mmol/L (ref 135–145)

## 2019-08-11 LAB — CBC WITH DIFFERENTIAL/PLATELET
Abs Immature Granulocytes: 0.12 10*3/uL — ABNORMAL HIGH (ref 0.00–0.07)
Basophils Absolute: 0 10*3/uL (ref 0.0–0.1)
Basophils Relative: 0 %
Eosinophils Absolute: 0.4 10*3/uL (ref 0.0–0.5)
Eosinophils Relative: 4 %
HCT: 39.6 % (ref 39.0–52.0)
Hemoglobin: 13.2 g/dL (ref 13.0–17.0)
Immature Granulocytes: 1 %
Lymphocytes Relative: 11 %
Lymphs Abs: 1.2 10*3/uL (ref 0.7–4.0)
MCH: 31.6 pg (ref 26.0–34.0)
MCHC: 33.3 g/dL (ref 30.0–36.0)
MCV: 94.7 fL (ref 80.0–100.0)
Monocytes Absolute: 0.7 10*3/uL (ref 0.1–1.0)
Monocytes Relative: 7 %
Neutro Abs: 8.3 10*3/uL — ABNORMAL HIGH (ref 1.7–7.7)
Neutrophils Relative %: 77 %
Platelets: 291 10*3/uL (ref 150–400)
RBC: 4.18 MIL/uL — ABNORMAL LOW (ref 4.22–5.81)
RDW: 11.9 % (ref 11.5–15.5)
WBC: 10.8 10*3/uL — ABNORMAL HIGH (ref 4.0–10.5)
nRBC: 0 % (ref 0.0–0.2)

## 2019-08-11 LAB — PHOSPHORUS: Phosphorus: 3.2 mg/dL (ref 2.5–4.6)

## 2019-08-11 LAB — MAGNESIUM: Magnesium: 2.1 mg/dL (ref 1.7–2.4)

## 2019-08-11 MED ORDER — THIAMINE HCL 100 MG PO TABS: 100.0000 mg | ORAL_TABLET | Freq: Every day | ORAL | 0 refills | Status: AC

## 2019-08-11 MED ORDER — PANTOPRAZOLE SODIUM 40 MG PO TBEC: 40.0000 mg | DELAYED_RELEASE_TABLET | Freq: Every day | ORAL | 0 refills | Status: AC

## 2019-08-11 MED ORDER — ADULT MULTIVITAMIN W/MINERALS CH: 1.0000 | ORAL_TABLET | Freq: Every day | ORAL | 0 refills | Status: AC

## 2019-08-11 MED ORDER — PRO-STAT SUGAR FREE PO LIQD: 30.0000 mL | Freq: Two times a day (BID) | ORAL | 0 refills | Status: DC

## 2019-08-11 MED ORDER — FOLIC ACID 1 MG PO TABS: 1.0000 mg | ORAL_TABLET | Freq: Every day | ORAL | 0 refills | Status: AC

## 2019-08-11 NOTE — Plan of Care (Signed)
  Problem: Clinical Measurements: Goal: Ability to maintain clinical measurements within normal limits will improve Outcome: Adequate for Discharge Goal: Will remain free from infection Outcome: Adequate for Discharge Goal: Diagnostic test results will improve Outcome: Adequate for Discharge Goal: Respiratory complications will improve Outcome: Adequate for Discharge Goal: Cardiovascular complication will be avoided Outcome: Adequate for Discharge   Problem: Pain Managment: Goal: General experience of comfort will improve Outcome: Adequate for Discharge   

## 2019-08-11 NOTE — Discharge Summary (Signed)
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 performed in [facility] hospital lab) Nasopharyngeal Nasopharyngeal Swab     Status: None   Collection Time: 08/04/19  8:26 PM   Specimen: Nasopharyngeal Swab  Result Value Ref Range Status   SARS Coronavirus 2 NEGATIVE NEGATIVE Final    Comment: (NOTE) If result is NEGATIVE SARS-CoV-2 target nucleic acids are NOT DETECTED. The SARS-CoV-2 RNA is generally detectable in upper and lower  respiratory specimens during the acute phase of infection. The lowest  concentration of SARS-CoV-2 viral copies this assay can detect is 250  copies / mL. A negative result does not preclude SARS-CoV-2 infection  and should not be used as the sole basis for treatment or other  patient management decisions.  A negative result may occur with  improper specimen collection / handling, submission of specimen other  than nasopharyngeal swab, presence of viral mutation(s) within the  areas targeted by this assay, and inadequate number of viral copies  (<250 copies / mL). A negative  result must be combined with clinical  observations, patient history, and epidemiological information. If result is POSITIVE SARS-CoV-2 target nucleic acids are DETECTED. The SARS-CoV-2 RNA is generally detectable in upper and lower  respiratory specimens dur ing the acute phase of infection.  Positive  results are indicative of active infection with SARS-CoV-2.  Clinical  correlation with patient history and other diagnostic information is  necessary to determine patient  infection status.  Positive results do  not rule out bacterial infection or co-infection with other viruses. If result is PRESUMPTIVE POSTIVE SARS-CoV-2 nucleic acids MAY BE PRESENT.   A presumptive positive result was obtained on the submitted specimen  and confirmed on repeat testing.  While 2019 novel coronavirus  (SARS-CoV-2) nucleic acids may be present in the submitted sample  additional confirmatory testing may be necessary for epidemiological  and / or clinical management purposes  to differentiate between  SARS-CoV-2 and other Sarbecovirus currently known to infect humans.  If clinically indicated additional testing with an alternate test  methodology [phone]) is advised. The SARS-CoV-2 RNA is generally  detectable in upper and lower respiratory sp ecimens during the acute  phase of infection. The expected result is Negative. Fact Sheet for Patients:  BoilerBrush.com.cy Fact Sheet for Healthcare Providers: https://pope.com/ This test is not yet approved or cleared by the [place] FDA and has been authorized for detection and/or diagnosis of SARS-CoV-2 by FDA under an Emergency Use Authorization (EUA).  This EUA will remain in effect (meaning this test can be used) for the duration of the COVID-19 declaration under Section 564(b)(1) of the Act, 21 U.S.C. section 360bbb-3(b)(1), unless the authorization is terminated or revoked sooner. Performed at [facility], [address], [place], [place] [address]   Culture, Urine     Status: None   Collection Time: 08/08/19  4:11 PM   Specimen: Urine, Random  Result Value Ref Range Status   Specimen Description   Final    URINE, RANDOM Performed at [facility] [address] [place], [place] [address]    Special Requests   Final    NONE Performed at [facility], [address] [place], [place] [address]    Culture   Final    NO GROWTH Performed at [facility], [address], [place], [place] [address]    Report Status 08/09/2019 FINAL  Final  Culture, blood (routine x 2)     Status: None (Preliminary result)   Collection Time: 08/08/19  4:34 PM   Specimen: BLOOD RIGHT HAND  Result Value Ref Range Status   Specimen Description   Final    BLOOD RIGHT HAND Performed at [facility], [address] [place], [place] [address]    Special Requests   Final    BOTTLES DRAWN AEROBIC AND ANAEROBIC Blood Culture adequate volume Performed at [facility], [address] [place], [place] [address]    Culture   Final    NO GROWTH 3 DAYS Performed at [facility], [address], [place], [place] [address]    Report Status PENDING  Incomplete  Culture, blood (routine x 2)     Status: None (Preliminary result)   Collection Time: 08/08/19  4:36 PM   Specimen: BLOOD LEFT HAND  Result Value Ref Range Status   Specimen Description   Final    BLOOD LEFT HAND Performed at [facility], [address] [place], [place] [address]    Special Requests   Final    BOTTLES DRAWN AEROBIC ONLY Blood Culture results may not be optimal due to an inadequate volume of blood received in culture bottles Performed at [facility], [address], [place], [place] [address]    Culture   Final    NO GROWTH 3 DAYS Performed at [facility],  [address], [place], [place] [address]    Report Status PENDING  Incomplete    Labs: BNP (last 3 results) No results for input(s): BNP in the last 8760 hours. Basic Metabolic Panel: Recent Labs  Lab 08/05/19 0542  08/07/19 0517 08/08/19 0511 08/09/19 0538 08/10/19 0540 08/11/19 0839  NA  --    < > 139 138 142 143 138  K  --    < > 3.7 3.4* 2.9* 3.2* 3.8  CL  --    < > 107 105 112* 109 106  CO2  --    < > 22 25 21* 23 21*  GLUCOSE  --    < > 158* 131* 163* 107* 104*  BUN  --    < > 41* 37* 23 14 13   CREATININE  --    < > 1.23 1.01 0.94 0.77 0.84  CALCIUM  --    < > 8.5* 7.3* 7.6* 7.8* 8.0*  MG 1.8  --   --  2.5* 2.3 2.3 2.1  PHOS 2.7  --   --  2.6 2.5 3.3 3.2   < > = values in this interval not displayed.   Liver Function Tests: Recent Labs  Lab 08/07/19 0517 08/08/19 0511 08/09/19 0538 08/10/19 0540  AST 17 12* 12* 13*  ALT 13 11 11 12   ALKPHOS 39 30* 31* 32*  BILITOT 1.0 1.3* 0.9 1.1  PROT 6.3* 5.5* 5.3* 5.3*  ALBUMIN 2.9* 2.6* 2.5* 2.5*   Recent Labs  Lab 08/07/19 0517  LIPASE 23   No results for input(s): AMMONIA in the last 168 hours. CBC: Recent Labs  Lab 08/07/19 0517 08/08/19 0511 08/09/19 0538 08/10/19 0540 08/11/19 0839  WBC 11.1* 9.6 10.4 9.7 10.8*  NEUTROABS  --   --  8.2* 7.1 8.3*  HGB 15.8 13.2 12.7* 12.6* 13.2  HCT 48.6 39.8 39.8 38.6* 39.6  MCV 96.4 97.1 98.5 96.5 94.7  PLT 356 309 288 261 291   Cardiac Enzymes: No results for input(s): CKTOTAL, CKMB, CKMBINDEX, TROPONINI in the last 168 hours. BNP: Invalid input(s): POCBNP CBG: No results for input(s): GLUCAP in the last 168 hours. D-Dimer No results for input(s): DDIMER in the last 72 hours. Hgb A1c No results for input(s): HGBA1C in the last 72 hours. Lipid Profile No results for input(s): CHOL, HDL, LDLCALC, TRIG, CHOLHDL, LDLDIRECT in the last 72 hours. Thyroid function studies Recent Labs    08/08/19 1956  TSH 2.405   Anemia work up No results for  input(s): VITAMINB12, FOLATE, FERRITIN, TIBC, IRON, RETICCTPCT in the last 72 hours. Urinalysis    Component Value Date/Time   COLORURINE AMBER (A) 08/08/2019 1611   APPEARANCEUR CLEAR 08/08/2019 1611   LABSPEC 1.028 08/08/2019 1611   PHURINE 5.0 08/08/2019 1611   GLUCOSEU NEGATIVE 08/08/2019 1611   HGBUR NEGATIVE 08/08/2019 1611   BILIRUBINUR NEGATIVE 08/08/2019 1611   KETONESUR 5 (A) 08/08/2019 1611   PROTEINUR NEGATIVE 08/08/2019 1611   UROBILINOGEN 0.2 04/01/2015 1155   NITRITE NEGATIVE 08/08/2019 1611   LEUKOCYTESUR TRACE (A) 08/08/2019 1611   Sepsis Labs Invalid input(s): PROCALCITONIN,  WBC,  LACTICIDVEN Microbiology Recent Results (from the past 240 hour(s))  SARS Coronavirus 2 by RT PCR (hospital order, performed in [facility] hospital lab) Nasopharyngeal Nasopharyngeal Swab     Status: None   Collection Time: 08/04/19  8:26 PM   Specimen: Nasopharyngeal Swab  Result Value Ref Range Status   SARS Coronavirus 2 NEGATIVE NEGATIVE Final  Comment: (NOTE) If result is NEGATIVE SARS-CoV-2 target nucleic acids are NOT DETECTED. The SARS-CoV-2 RNA is generally detectable in upper and lower  respiratory specimens during the acute phase of infection. The lowest  concentration of SARS-CoV-2 viral copies this assay can detect is 250  copies / mL. A negative result does not preclude SARS-CoV-2 infection  and should not be used as the sole basis for treatment or other  patient management decisions.  A negative result may occur with  improper specimen collection / handling, submission of specimen other  than nasopharyngeal swab, presence of viral mutation(s) within the  areas targeted by this assay, and inadequate number of viral copies  (<250 copies / mL). A negative result must be combined with clinical  observations, patient history, and epidemiological information. If result is POSITIVE SARS-CoV-2 target nucleic acids are DETECTED. The SARS-CoV-2 RNA is generally detectable  in upper and lower  respiratory specimens dur ing the acute phase of infection.  Positive  results are indicative of active infection with SARS-CoV-2.  Clinical  correlation with patient history and other diagnostic information is  necessary to determine patient infection status.  Positive results do  not rule out bacterial infection or co-infection with other viruses. If result is PRESUMPTIVE POSTIVE SARS-CoV-2 nucleic acids MAY BE PRESENT.   A presumptive positive result was obtained on the submitted specimen  and confirmed on repeat testing.  While 2019 novel coronavirus  (SARS-CoV-2) nucleic acids may be present in the submitted sample  additional confirmatory testing may be necessary for epidemiological  and / or clinical management purposes  to differentiate between  SARS-CoV-2 and other Sarbecovirus currently known to infect humans.  If clinically indicated additional testing with an alternate test  methodology [phone]) is advised. The SARS-CoV-2 RNA is generally  detectable in upper and lower respiratory sp ecimens during the acute  phase of infection. The expected result is Negative. Fact Sheet for Patients:  BoilerBrush.com.cy Fact Sheet for Healthcare Providers: https://pope.com/ This test is not yet approved or cleared by the [place] FDA and has been authorized for detection and/or diagnosis of SARS-CoV-2 by FDA under an Emergency Use Authorization (EUA).  This EUA will remain in effect (meaning this test can be used) for the duration of the COVID-19 declaration under Section 564(b)(1) of the Act, 21 U.S.C. section 360bbb-3(b)(1), unless the authorization is terminated or revoked sooner. Performed at [facility], [address] [place], [place] [address]   Culture, Urine     Status: None   Collection Time: 08/08/19  4:11 PM   Specimen: Urine, Random  Result Value Ref Range Status   Specimen  Description   Final    URINE, RANDOM Performed at [facility], [address] [place], [place] [address]    Special Requests   Final    NONE Performed at [facility], [address], [place], [place] [address]    Culture   Final    NO GROWTH Performed at [facility], [address], [place], [place] [address]    Report Status 08/09/2019 FINAL  Final  Culture, blood (routine x 2)     Status: None (Preliminary result)   Collection Time: 08/08/19  4:34 PM   Specimen: BLOOD RIGHT HAND  Result Value Ref Range Status   Specimen Description   Final    BLOOD RIGHT HAND Performed at [facility], [address], [place], [place] [address]    Special  Requests   Final    BOTTLES DRAWN AEROBIC AND ANAEROBIC Blood Culture adequate volume Performed at [facility], [address] [place], [place] [address]    Culture   Final    NO GROWTH 3 DAYS Performed at [facility], [address] [place], [place] [address]    Report Status PENDING  Incomplete  Culture, blood (routine x 2)     Status: None (Preliminary result)   Collection Time: 08/08/19  4:36 PM   Specimen: BLOOD LEFT HAND  Result Value Ref Range Status   Specimen Description   Final    BLOOD LEFT HAND Performed at [facility], [address], [place], [place] [address]    Special Requests   Final    BOTTLES DRAWN AEROBIC ONLY Blood Culture results may not be optimal due to an inadequate volume of blood received in culture bottles Performed at [facility], [address] [place], [place] [address]    Culture   Final    NO GROWTH 3 DAYS Performed at [facility], [address], [place], [place] [address]    Report Status PENDING  Incomplete   Time coordinating discharge: 35 minutes  SIGNED:  Merlene Laughter, DO [facility] 08/11/2019, 7:15 PM Pager  is on AMION  If 7PM-7AM, please contact night-coverage [email or web address] Password [email or web address]

## 2019-08-11 NOTE — Progress Notes (Signed)
Charlesetta Shanks to be D/C'd Home per MD order.  Discussed prescriptions and follow up appointments with the patient. Prescriptions given to patient, medication list explained in detail. Pt verbalized understanding.  Allergies as of 08/11/2019   No Known Allergies     Medication List    STOP taking these medications   docusate sodium 100 MG capsule Commonly known as: COLACE   ferrous sulfate 325 (65 FE) MG tablet   HYDROcodone-acetaminophen 7.5-325 MG tablet Commonly known as: NORCO   methocarbamol 500 MG tablet Commonly known as: ROBAXIN   omeprazole 20 MG tablet Commonly known as: PRILOSEC OTC   ondansetron 4 MG tablet Commonly known as: Zofran   polyethylene glycol 17 g packet Commonly known as: MIRALAX / GLYCOLAX     TAKE these medications   feeding supplement (PRO-STAT SUGAR FREE 64) Liqd Take 30 mLs by mouth 2 (two) times daily.   folic acid 1 MG tablet Commonly known as: FOLVITE Take 1 tablet (1 mg total) by mouth daily. Start taking on: August 12, 2019   multivitamin with minerals Tabs tablet Take 1 tablet by mouth daily. Start taking on: August 12, 2019   pantoprazole 40 MG tablet Commonly known as: PROTONIX Take 1 tablet (40 mg total) by mouth daily. Start taking on: August 12, 2019   thiamine 100 MG tablet Take 1 tablet (100 mg total) by mouth daily. Start taking on: August 12, 2019       Vitals:   08/10/19 1956 08/11/19 0534  BP: (!) 156/80 (!) 160/89  Pulse: 86 89  Resp: 18 18  Temp: 99.8 F (37.7 C) 99 F (37.2 C)  SpO2: 95% 94%    Skin clean, dry and intact without evidence of skin break down, no evidence of skin tears noted. IV catheter discontinued intact. Site without signs and symptoms of complications. Dressing and pressure applied. Pt denies pain at this time. No complaints noted.  An After Visit Summary was printed and given to the patient and wife. Patient escorted via [place], and D/C home via private auto.  Nonie Hoyer  S 08/11/2019 3:01 PM

## 2019-08-11 NOTE — Progress Notes (Signed)
CM received consult for pcp needs. Patient reports he has pcp that he is active with.

## 2019-08-13 LAB — CULTURE, BLOOD (ROUTINE X 2)
Culture: NO GROWTH
Culture: NO GROWTH
Special Requests: ADEQUATE

## 2019-09-02 ENCOUNTER — Encounter (HOSPITAL_COMMUNITY): Payer: Self-pay | Admitting: Obstetrics and Gynecology

## 2019-09-02 ENCOUNTER — Inpatient Hospital Stay (HOSPITAL_COMMUNITY)
Admission: EM | Admit: 2019-09-02 | Discharge: 2019-09-13 | DRG: 417 | Disposition: A | Discharge status: Home / Self Care | Payer: PPO | Attending: Internal Medicine | Admitting: Internal Medicine

## 2019-09-02 ENCOUNTER — Emergency Department (HOSPITAL_COMMUNITY): Payer: PPO

## 2019-09-02 ENCOUNTER — Other Ambulatory Visit: Payer: Self-pay

## 2019-09-02 DIAGNOSIS — J9811 Atelectasis: Secondary | ICD-10-CM | POA: Diagnosis present

## 2019-09-02 DIAGNOSIS — R935 Abnormal findings on diagnostic imaging of other abdominal regions, including retroperitoneum: Secondary | ICD-10-CM | POA: Diagnosis present

## 2019-09-02 DIAGNOSIS — K819 Cholecystitis, unspecified: Secondary | ICD-10-CM | POA: Diagnosis not present

## 2019-09-02 DIAGNOSIS — M1990 Unspecified osteoarthritis, unspecified site: Secondary | ICD-10-CM | POA: Diagnosis present

## 2019-09-02 DIAGNOSIS — R109 Unspecified abdominal pain: Secondary | ICD-10-CM | POA: Diagnosis not present

## 2019-09-02 DIAGNOSIS — R0902 Hypoxemia: Secondary | ICD-10-CM

## 2019-09-02 DIAGNOSIS — Z79899 Other long term (current) drug therapy: Secondary | ICD-10-CM

## 2019-09-02 DIAGNOSIS — I10 Essential (primary) hypertension: Secondary | ICD-10-CM | POA: Diagnosis present

## 2019-09-02 DIAGNOSIS — N179 Acute kidney failure, unspecified: Secondary | ICD-10-CM | POA: Diagnosis present

## 2019-09-02 DIAGNOSIS — R52 Pain, unspecified: Secondary | ICD-10-CM

## 2019-09-02 DIAGNOSIS — R933 Abnormal findings on diagnostic imaging of other parts of digestive tract: Secondary | ICD-10-CM | POA: Diagnosis not present

## 2019-09-02 DIAGNOSIS — K651 Peritoneal abscess: Secondary | ICD-10-CM | POA: Diagnosis not present

## 2019-09-02 DIAGNOSIS — Z20828 Contact with and (suspected) exposure to other viral communicable diseases: Secondary | ICD-10-CM | POA: Diagnosis present

## 2019-09-02 DIAGNOSIS — R1011 Right upper quadrant pain: Secondary | ICD-10-CM

## 2019-09-02 DIAGNOSIS — R188 Other ascites: Secondary | ICD-10-CM

## 2019-09-02 DIAGNOSIS — R1084 Generalized abdominal pain: Secondary | ICD-10-CM | POA: Diagnosis not present

## 2019-09-02 DIAGNOSIS — J9601 Acute respiratory failure with hypoxia: Secondary | ICD-10-CM | POA: Diagnosis present

## 2019-09-02 DIAGNOSIS — Z96642 Presence of left artificial hip joint: Secondary | ICD-10-CM | POA: Diagnosis present

## 2019-09-02 DIAGNOSIS — E860 Dehydration: Secondary | ICD-10-CM | POA: Diagnosis present

## 2019-09-02 DIAGNOSIS — J90 Pleural effusion, not elsewhere classified: Secondary | ICD-10-CM | POA: Diagnosis present

## 2019-09-02 DIAGNOSIS — K8010 Calculus of gallbladder with chronic cholecystitis without obstruction: Secondary | ICD-10-CM | POA: Diagnosis present

## 2019-09-02 DIAGNOSIS — D62 Acute posthemorrhagic anemia: Secondary | ICD-10-CM | POA: Diagnosis not present

## 2019-09-02 DIAGNOSIS — F1020 Alcohol dependence, uncomplicated: Secondary | ICD-10-CM | POA: Diagnosis present

## 2019-09-02 DIAGNOSIS — I159 Secondary hypertension, unspecified: Secondary | ICD-10-CM

## 2019-09-02 DIAGNOSIS — R739 Hyperglycemia, unspecified: Secondary | ICD-10-CM | POA: Diagnosis present

## 2019-09-02 DIAGNOSIS — K449 Diaphragmatic hernia without obstruction or gangrene: Secondary | ICD-10-CM | POA: Diagnosis present

## 2019-09-02 DIAGNOSIS — L02211 Cutaneous abscess of abdominal wall: Secondary | ICD-10-CM | POA: Diagnosis present

## 2019-09-02 DIAGNOSIS — F329 Major depressive disorder, single episode, unspecified: Secondary | ICD-10-CM | POA: Diagnosis present

## 2019-09-02 DIAGNOSIS — K811 Chronic cholecystitis: Secondary | ICD-10-CM | POA: Diagnosis not present

## 2019-09-02 DIAGNOSIS — E876 Hypokalemia: Secondary | ICD-10-CM | POA: Diagnosis not present

## 2019-09-02 DIAGNOSIS — K224 Dyskinesia of esophagus: Secondary | ICD-10-CM | POA: Diagnosis present

## 2019-09-02 DIAGNOSIS — K219 Gastro-esophageal reflux disease without esophagitis: Secondary | ICD-10-CM | POA: Diagnosis present

## 2019-09-02 DIAGNOSIS — R7989 Other specified abnormal findings of blood chemistry: Secondary | ICD-10-CM | POA: Diagnosis not present

## 2019-09-02 DIAGNOSIS — K828 Other specified diseases of gallbladder: Secondary | ICD-10-CM | POA: Diagnosis not present

## 2019-09-02 DIAGNOSIS — Z419 Encounter for procedure for purposes other than remedying health state, unspecified: Secondary | ICD-10-CM | POA: Diagnosis not present

## 2019-09-02 DIAGNOSIS — R112 Nausea with vomiting, unspecified: Secondary | ICD-10-CM | POA: Diagnosis not present

## 2019-09-02 DIAGNOSIS — T8143XA Infection following a procedure, organ and space surgical site, initial encounter: Secondary | ICD-10-CM | POA: Diagnosis not present

## 2019-09-02 DIAGNOSIS — Z03818 Encounter for observation for suspected exposure to other biological agents ruled out: Secondary | ICD-10-CM | POA: Diagnosis not present

## 2019-09-02 LAB — COMPREHENSIVE METABOLIC PANEL
ALT: 17 U/L (ref 0–44)
AST: 17 U/L (ref 15–41)
Albumin: 4 g/dL (ref 3.5–5.0)
Alkaline Phosphatase: 43 U/L (ref 38–126)
Anion gap: 13 (ref 5–15)
BUN: 29 mg/dL — ABNORMAL HIGH (ref 8–23)
CO2: 21 mmol/L — ABNORMAL LOW (ref 22–32)
Calcium: 9.4 mg/dL (ref 8.9–10.3)
Chloride: 102 mmol/L (ref 98–111)
Creatinine, Ser: 1.45 mg/dL — ABNORMAL HIGH (ref 0.61–1.24)
GFR calc Af Amer: 59 mL/min — ABNORMAL LOW (ref >60)
GFR calc non Af Amer: 51 mL/min — ABNORMAL LOW (ref >60)
Glucose, Bld: 209 mg/dL — ABNORMAL HIGH (ref 70–99)
Potassium: 4.1 mmol/L (ref 3.5–5.1)
Sodium: 136 mmol/L (ref 135–145)
Total Bilirubin: 1.3 mg/dL — ABNORMAL HIGH (ref 0.3–1.2)
Total Protein: 7.5 g/dL (ref 6.5–8.1)

## 2019-09-02 LAB — CBC
HCT: 53.1 % — ABNORMAL HIGH (ref 39.0–52.0)
Hemoglobin: 18 g/dL — ABNORMAL HIGH (ref 13.0–17.0)
MCH: 31 pg (ref 26.0–34.0)
MCHC: 33.9 g/dL (ref 30.0–36.0)
MCV: 91.6 fL (ref 80.0–100.0)
Platelets: 514 10*3/uL — ABNORMAL HIGH (ref 150–400)
RBC: 5.8 MIL/uL (ref 4.22–5.81)
RDW: 12.1 % (ref 11.5–15.5)
WBC: 16.2 10*3/uL — ABNORMAL HIGH (ref 4.0–10.5)
nRBC: 0 % (ref 0.0–0.2)

## 2019-09-02 LAB — URINALYSIS, ROUTINE W REFLEX MICROSCOPIC
Glucose, UA: 50 mg/dL — AB
Hgb urine dipstick: NEGATIVE
Ketones, ur: 5 mg/dL — AB
Leukocytes,Ua: NEGATIVE
Nitrite: NEGATIVE
Protein, ur: 30 mg/dL — AB
Specific Gravity, Urine: 1.029 (ref 1.005–1.030)
pH: 5 (ref 5.0–8.0)

## 2019-09-02 LAB — LACTIC ACID, PLASMA: Lactic Acid, Venous: 1.2 mmol/L (ref 0.5–1.9)

## 2019-09-02 LAB — LIPASE, BLOOD: Lipase: 55 U/L — ABNORMAL HIGH (ref 11–51)

## 2019-09-02 MED ORDER — HYDROMORPHONE HCL 1 MG/ML IJ SOLN
1.0000 mg | Freq: Once | INTRAMUSCULAR | Status: AC
  Administered 2019-09-02: 1 mg via INTRAVENOUS
  Filled 2019-09-02: qty 1

## 2019-09-02 MED ORDER — HYDROMORPHONE HCL 1 MG/ML IJ SOLN
1.0000 mg | Freq: Once | INTRAMUSCULAR | Status: AC
  Administered 2019-09-02: 22:00:00 1 mg via INTRAVENOUS
  Filled 2019-09-02: qty 1

## 2019-09-02 MED ORDER — SODIUM CHLORIDE 0.9% FLUSH
3.0000 mL | Freq: Once | INTRAVENOUS | Status: AC
  Administered 2019-09-02: 12:00:00 3 mL via INTRAVENOUS

## 2019-09-02 MED ORDER — SODIUM CHLORIDE 0.9 % IV BOLUS
1000.0000 mL | Freq: Once | INTRAVENOUS | Status: AC
  Administered 2019-09-02: 12:00:00 1000 mL via INTRAVENOUS

## 2019-09-02 MED ORDER — GADOBUTROL 1 MMOL/ML IV SOLN
9.0000 mL | Freq: Once | INTRAVENOUS | Status: AC | PRN
  Administered 2019-09-02: 9 mL via INTRAVENOUS

## 2019-09-02 MED ORDER — ONDANSETRON HCL 4 MG/2ML IJ SOLN
4.0000 mg | Freq: Once | INTRAMUSCULAR | Status: AC | PRN
  Administered 2019-09-02: 4 mg via INTRAVENOUS
  Filled 2019-09-02: qty 2

## 2019-09-02 MED ORDER — SODIUM CHLORIDE 0.9 % IV BOLUS
1000.0000 mL | Freq: Once | INTRAVENOUS | Status: AC
  Administered 2019-09-02: 1000 mL via INTRAVENOUS

## 2019-09-02 MED ORDER — LORAZEPAM 2 MG/ML IJ SOLN
1.0000 mg | INTRAMUSCULAR | Status: DC | PRN
  Administered 2019-09-02 – 2019-09-12 (×6): 1 mg via INTRAVENOUS
  Filled 2019-09-02 (×6): qty 1

## 2019-09-02 MED ORDER — MORPHINE SULFATE (PF) 4 MG/ML IV SOLN
4.0000 mg | Freq: Once | INTRAVENOUS | Status: AC
  Administered 2019-09-02: 4 mg via INTRAVENOUS
  Filled 2019-09-02: qty 1

## 2019-09-02 MED ORDER — ONDANSETRON HCL 4 MG/2ML IJ SOLN: 4.0000 mg | Freq: Once | INTRAMUSCULAR | Status: DC

## 2019-09-02 NOTE — ED Triage Notes (Signed)
Patient reports abdominal pain and emesis. Patient reports it is his gallbladder and that this has occurred before. Patient reports they have discussed removing the gallbladder previously but it has never been scheduled.  Patient reports pain 10/10

## 2019-09-02 NOTE — ED Notes (Signed)
Urinal at bedside. Patient made aware we need urine sample. Patient states he is unable to void at this time.   Patient given emesis bag and PRN nausea medication. Patient dry heaving.

## 2019-09-02 NOTE — ED Notes (Addendum)
MRI called - will scan in about 4 hrs
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
## 2019-09-02 NOTE — ED Notes (Signed)
US at bedside

## 2019-09-02 NOTE — ED Notes (Signed)
Patient transported to MRI 

## 2019-09-02 NOTE — ED Notes (Signed)
Patient requested this RN to call wife. Number on file. No answer. Screeners given permission to allow 1 visitor for wife.
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
## 2019-09-02 NOTE — ED Notes (Addendum)
Wife at bedside.

## 2019-09-03 ENCOUNTER — Inpatient Hospital Stay (HOSPITAL_COMMUNITY): Payer: PPO

## 2019-09-03 ENCOUNTER — Encounter (HOSPITAL_COMMUNITY): Payer: Self-pay | Admitting: Internal Medicine

## 2019-09-03 ENCOUNTER — Observation Stay (HOSPITAL_COMMUNITY): Payer: PPO

## 2019-09-03 DIAGNOSIS — K449 Diaphragmatic hernia without obstruction or gangrene: Secondary | ICD-10-CM | POA: Diagnosis present

## 2019-09-03 DIAGNOSIS — Z96642 Presence of left artificial hip joint: Secondary | ICD-10-CM | POA: Diagnosis present

## 2019-09-03 DIAGNOSIS — T8143XA Infection following a procedure, organ and space surgical site, initial encounter: Secondary | ICD-10-CM | POA: Diagnosis not present

## 2019-09-03 DIAGNOSIS — K8010 Calculus of gallbladder with chronic cholecystitis without obstruction: Secondary | ICD-10-CM | POA: Diagnosis present

## 2019-09-03 DIAGNOSIS — R935 Abnormal findings on diagnostic imaging of other abdominal regions, including retroperitoneum: Secondary | ICD-10-CM | POA: Diagnosis not present

## 2019-09-03 DIAGNOSIS — D62 Acute posthemorrhagic anemia: Secondary | ICD-10-CM | POA: Diagnosis not present

## 2019-09-03 DIAGNOSIS — J9601 Acute respiratory failure with hypoxia: Secondary | ICD-10-CM | POA: Diagnosis present

## 2019-09-03 DIAGNOSIS — K224 Dyskinesia of esophagus: Secondary | ICD-10-CM | POA: Diagnosis present

## 2019-09-03 DIAGNOSIS — E860 Dehydration: Secondary | ICD-10-CM | POA: Diagnosis present

## 2019-09-03 DIAGNOSIS — R1011 Right upper quadrant pain: Secondary | ICD-10-CM | POA: Diagnosis not present

## 2019-09-03 DIAGNOSIS — F1020 Alcohol dependence, uncomplicated: Secondary | ICD-10-CM | POA: Diagnosis present

## 2019-09-03 DIAGNOSIS — F329 Major depressive disorder, single episode, unspecified: Secondary | ICD-10-CM | POA: Diagnosis present

## 2019-09-03 DIAGNOSIS — E876 Hypokalemia: Secondary | ICD-10-CM | POA: Diagnosis not present

## 2019-09-03 DIAGNOSIS — Z79899 Other long term (current) drug therapy: Secondary | ICD-10-CM | POA: Diagnosis not present

## 2019-09-03 DIAGNOSIS — M1990 Unspecified osteoarthritis, unspecified site: Secondary | ICD-10-CM | POA: Diagnosis present

## 2019-09-03 DIAGNOSIS — Z20828 Contact with and (suspected) exposure to other viral communicable diseases: Secondary | ICD-10-CM | POA: Diagnosis present

## 2019-09-03 DIAGNOSIS — R52 Pain, unspecified: Secondary | ICD-10-CM | POA: Diagnosis present

## 2019-09-03 DIAGNOSIS — K219 Gastro-esophageal reflux disease without esophagitis: Secondary | ICD-10-CM | POA: Diagnosis present

## 2019-09-03 DIAGNOSIS — K819 Cholecystitis, unspecified: Secondary | ICD-10-CM | POA: Diagnosis present

## 2019-09-03 DIAGNOSIS — N179 Acute kidney failure, unspecified: Secondary | ICD-10-CM | POA: Diagnosis present

## 2019-09-03 DIAGNOSIS — J9811 Atelectasis: Secondary | ICD-10-CM | POA: Diagnosis present

## 2019-09-03 DIAGNOSIS — R188 Other ascites: Secondary | ICD-10-CM | POA: Diagnosis present

## 2019-09-03 DIAGNOSIS — J90 Pleural effusion, not elsewhere classified: Secondary | ICD-10-CM | POA: Diagnosis present

## 2019-09-03 DIAGNOSIS — R739 Hyperglycemia, unspecified: Secondary | ICD-10-CM | POA: Diagnosis present

## 2019-09-03 DIAGNOSIS — L02211 Cutaneous abscess of abdominal wall: Secondary | ICD-10-CM | POA: Diagnosis present

## 2019-09-03 DIAGNOSIS — I10 Essential (primary) hypertension: Secondary | ICD-10-CM | POA: Diagnosis present

## 2019-09-03 DIAGNOSIS — R1084 Generalized abdominal pain: Secondary | ICD-10-CM | POA: Diagnosis not present

## 2019-09-03 LAB — SEDIMENTATION RATE: Sed Rate: 30 mm/hr — ABNORMAL HIGH (ref 0–16)

## 2019-09-03 LAB — CBC
HCT: 49.6 % (ref 39.0–52.0)
Hemoglobin: 16.4 g/dL (ref 13.0–17.0)
MCH: 30.6 pg (ref 26.0–34.0)
MCHC: 33.1 g/dL (ref 30.0–36.0)
MCV: 92.5 fL (ref 80.0–100.0)
Platelets: 406 10*3/uL — ABNORMAL HIGH (ref 150–400)
RBC: 5.36 MIL/uL (ref 4.22–5.81)
RDW: 12.5 % (ref 11.5–15.5)
WBC: 9.8 10*3/uL (ref 4.0–10.5)
nRBC: 0 % (ref 0.0–0.2)

## 2019-09-03 LAB — GLUCOSE, CAPILLARY
Glucose-Capillary: 206 mg/dL — ABNORMAL HIGH (ref 70–99)
Glucose-Capillary: 168 mg/dL — ABNORMAL HIGH (ref 70–99)
Glucose-Capillary: 133 mg/dL — ABNORMAL HIGH (ref 70–99)
Glucose-Capillary: 134 mg/dL — ABNORMAL HIGH (ref 70–99)
Glucose-Capillary: 169 mg/dL — ABNORMAL HIGH (ref 70–99)
Glucose-Capillary: 124 mg/dL — ABNORMAL HIGH (ref 70–99)

## 2019-09-03 LAB — SODIUM, URINE, RANDOM: Sodium, Ur: 10 mmol/L

## 2019-09-03 LAB — CREATININE, URINE, RANDOM: Creatinine, Urine: 490.73 mg/dL

## 2019-09-03 LAB — BASIC METABOLIC PANEL
Anion gap: 9 (ref 5–15)
BUN: 47 mg/dL — ABNORMAL HIGH (ref 8–23)
CO2: 20 mmol/L — ABNORMAL LOW (ref 22–32)
Calcium: 8.2 mg/dL — ABNORMAL LOW (ref 8.9–10.3)
Chloride: 108 mmol/L (ref 98–111)
Creatinine, Ser: 1.68 mg/dL — ABNORMAL HIGH (ref 0.61–1.24)
GFR calc Af Amer: 50 mL/min — ABNORMAL LOW (ref >60)
GFR calc non Af Amer: 43 mL/min — ABNORMAL LOW (ref >60)
Glucose, Bld: 154 mg/dL — ABNORMAL HIGH (ref 70–99)
Potassium: 4.6 mmol/L (ref 3.5–5.1)
Sodium: 137 mmol/L (ref 135–145)

## 2019-09-03 LAB — HEMOGLOBIN A1C
Hgb A1c MFr Bld: 5.6 % (ref 4.8–5.6)
Mean Plasma Glucose: 114.02 mg/dL

## 2019-09-03 LAB — FERRITIN: Ferritin: 318 ng/mL (ref 24–336)

## 2019-09-03 LAB — PHOSPHORUS: Phosphorus: 3.4 mg/dL (ref 2.5–4.6)

## 2019-09-03 LAB — MAGNESIUM: Magnesium: 2.2 mg/dL (ref 1.7–2.4)

## 2019-09-03 LAB — C-REACTIVE PROTEIN: CRP: 32 mg/dL — ABNORMAL HIGH (ref <1.0)

## 2019-09-03 LAB — SARS CORONAVIRUS 2 (TAT 6-24 HRS): SARS Coronavirus 2: NEGATIVE

## 2019-09-03 LAB — D-DIMER, QUANTITATIVE: D-Dimer, Quant: 3.71 ug/mL-FEU — ABNORMAL HIGH (ref 0.00–0.50)

## 2019-09-03 LAB — LACTIC ACID, PLASMA: Lactic Acid, Venous: 1.7 mmol/L (ref 0.5–1.9)

## 2019-09-03 LAB — LACTATE DEHYDROGENASE: LDH: 158 U/L (ref 98–192)

## 2019-09-03 LAB — TSH: TSH: 3.9 u[IU]/mL (ref 0.350–4.500)

## 2019-09-03 MED ORDER — SODIUM CHLORIDE (PF) 0.9 % IJ SOLN
INTRAMUSCULAR | Status: AC
  Filled 2019-09-03: qty 50

## 2019-09-03 MED ORDER — ACETAMINOPHEN 650 MG RE SUPP: 650.0000 mg | Freq: Four times a day (QID) | RECTAL | Status: DC | PRN

## 2019-09-03 MED ORDER — THIAMINE HCL 100 MG/ML IJ SOLN
100.0000 mg | Freq: Every day | INTRAMUSCULAR | Status: DC
  Administered 2019-09-03 – 2019-09-07 (×5): 100 mg via INTRAVENOUS
  Filled 2019-09-03 (×5): qty 2

## 2019-09-03 MED ORDER — PIPERACILLIN-TAZOBACTAM 3.375 G IVPB 30 MIN
3.3750 g | INTRAVENOUS | Status: AC
  Administered 2019-09-03: 3.375 g via INTRAVENOUS
  Filled 2019-09-03 (×2): qty 50

## 2019-09-03 MED ORDER — ADULT MULTIVITAMIN W/MINERALS CH
1.0000 | ORAL_TABLET | Freq: Every day | ORAL | Status: DC
  Administered 2019-09-06 – 2019-09-13 (×7): 1 via ORAL
  Filled 2019-09-03 (×7): qty 1

## 2019-09-03 MED ORDER — LORAZEPAM 1 MG PO TABS: 1.0000 mg | ORAL_TABLET | ORAL | Status: AC | PRN

## 2019-09-03 MED ORDER — INSULIN ASPART 100 UNIT/ML ~~LOC~~ SOLN
0.0000 [IU] | SUBCUTANEOUS | Status: DC
  Administered 2019-09-03: 1 [IU] via SUBCUTANEOUS
  Administered 2019-09-03 (×2): 2 [IU] via SUBCUTANEOUS
  Administered 2019-09-03: 3 [IU] via SUBCUTANEOUS
  Administered 2019-09-03 – 2019-09-06 (×14): 1 [IU] via SUBCUTANEOUS

## 2019-09-03 MED ORDER — LORAZEPAM 2 MG/ML IJ SOLN
1.0000 mg | INTRAMUSCULAR | Status: AC | PRN
  Administered 2019-09-04: 1 mg via INTRAVENOUS
  Filled 2019-09-03: qty 1

## 2019-09-03 MED ORDER — ONDANSETRON HCL 4 MG/2ML IJ SOLN: 4.0000 mg | INTRAMUSCULAR | Status: DC | PRN

## 2019-09-03 MED ORDER — FOLIC ACID 1 MG PO TABS: 1.0000 mg | ORAL_TABLET | Freq: Every day | ORAL | Status: DC

## 2019-09-03 MED ORDER — SODIUM CHLORIDE 0.9 % IV SOLN
INTRAVENOUS | Status: AC
  Administered 2019-09-03: 02:00:00 via INTRAVENOUS

## 2019-09-03 MED ORDER — ONDANSETRON HCL 4 MG PO TABS: 4.0000 mg | ORAL_TABLET | Freq: Four times a day (QID) | ORAL | Status: DC | PRN

## 2019-09-03 MED ORDER — VITAMIN B-1 100 MG PO TABS: 100.0000 mg | ORAL_TABLET | Freq: Every day | ORAL | Status: DC

## 2019-09-03 MED ORDER — LABETALOL HCL 5 MG/ML IV SOLN: 10.0000 mg | INTRAVENOUS | Status: DC | PRN

## 2019-09-03 MED ORDER — ACETAMINOPHEN 325 MG PO TABS: 650.0000 mg | ORAL_TABLET | Freq: Four times a day (QID) | ORAL | Status: DC | PRN

## 2019-09-03 MED ORDER — TECHNETIUM TO 99M ALBUMIN AGGREGATED
1.5400 | Freq: Once | INTRAVENOUS | Status: AC | PRN
  Administered 2019-09-03: 1.54 via INTRAVENOUS

## 2019-09-03 MED ORDER — ONDANSETRON HCL 4 MG/2ML IJ SOLN
4.0000 mg | INTRAMUSCULAR | Status: DC | PRN
  Administered 2019-09-03 – 2019-09-05 (×8): 4 mg via INTRAVENOUS
  Filled 2019-09-03 (×7): qty 2

## 2019-09-03 MED ORDER — PIPERACILLIN-TAZOBACTAM 3.375 G IVPB
3.3750 g | Freq: Three times a day (TID) | INTRAVENOUS | Status: AC
  Administered 2019-09-04 – 2019-09-10 (×21): 3.375 g via INTRAVENOUS
  Filled 2019-09-03 (×19): qty 50

## 2019-09-03 MED ORDER — LACTATED RINGERS IV SOLN
INTRAVENOUS | Status: DC
  Administered 2019-09-03 – 2019-09-05 (×5): via INTRAVENOUS

## 2019-09-03 MED ORDER — ONDANSETRON HCL 4 MG PO TABS: 4.0000 mg | ORAL_TABLET | ORAL | Status: DC | PRN

## 2019-09-03 MED ORDER — HYDROCODONE-ACETAMINOPHEN 5-325 MG PO TABS
1.0000 | ORAL_TABLET | ORAL | Status: DC | PRN
  Administered 2019-09-06: 1 via ORAL
  Filled 2019-09-03: qty 1

## 2019-09-03 MED ORDER — IOHEXOL 9 MG/ML PO SOLN
500.0000 mL | ORAL | Status: AC
  Administered 2019-09-03 (×2): 500 mL via ORAL

## 2019-09-03 MED ORDER — HEPARIN SODIUM (PORCINE) 5000 UNIT/ML IJ SOLN
5000.0000 [IU] | Freq: Three times a day (TID) | INTRAMUSCULAR | Status: DC
  Administered 2019-09-03 – 2019-09-11 (×22): 5000 [IU] via SUBCUTANEOUS
  Filled 2019-09-03 (×22): qty 1

## 2019-09-03 MED ORDER — IOHEXOL 350 MG/ML SOLN
80.0000 mL | Freq: Once | INTRAVENOUS | Status: AC | PRN
  Administered 2019-09-03: 80 mL via INTRAVENOUS

## 2019-09-03 MED ORDER — HYDROMORPHONE HCL 1 MG/ML IJ SOLN
0.5000 mg | INTRAMUSCULAR | Status: DC | PRN
  Administered 2019-09-03 – 2019-09-08 (×16): 1 mg via INTRAVENOUS
  Administered 2019-09-12: 21:00:00 0.5 mg via INTRAVENOUS
  Filled 2019-09-03 (×18): qty 1

## 2019-09-03 MED ORDER — ONDANSETRON HCL 4 MG/2ML IJ SOLN
4.0000 mg | Freq: Four times a day (QID) | INTRAMUSCULAR | Status: DC | PRN
  Administered 2019-09-03: 4 mg via INTRAVENOUS
  Filled 2019-09-03: qty 2

## 2019-09-03 NOTE — Progress Notes (Signed)
Dr Manuella Ghazi paged and made aware that CT results are now loaded in Epic

## 2019-09-03 NOTE — Plan of Care (Signed)
  Problem: Education: Goal: Knowledge of General Education information will improve Description: Including pain rating scale, medication(s)/side effects and non-pharmacologic comfort measures Outcome: Progressing   Problem: Clinical Measurements: Goal: Respiratory complications will improve Outcome: Progressing Goal: Cardiovascular complication will be avoided Outcome: Progressing   Problem: Elimination: Goal: Will not experience complications related to urinary retention Outcome: Progressing   Problem: Pain Managment: Goal: General experience of comfort will improve Outcome: Progressing   Problem: Safety: Goal: Ability to remain free from injury will improve Outcome: Progressing   

## 2019-09-03 NOTE — Progress Notes (Addendum)
Patient ID: Corey Logan, male   DOB: Mar 12, 1956, 63 y.o.   MRN: [id number]       Subjective: This is a 63 yo white male with a history of heavy ETOH abuse [facility], HTN, and GERD, who is known to our service for abdominal pain.  He was seen last admission a couple of weeks ago.  He states that he had abdominal pain with some N/V.  He underwent a thorough work up at that time with a CT that revealed some gb wall thickening, but an US that was normal.  No cholelithiasis.  He then underwent a HIDA which was negative for cholecystitis and had a normal EF.  GI evaluated him and performed an EGD with no specific findings to explain the patient's pain, but the procedure was terminated early due to vomiting.  His pain improved and no surgical intervention was recommended due to a negative work up.  He was discharged home after improving with ETOH withdrawal.     Since home he has had no further issues and abstained from ETOH.  However Saturday morning around 1100am he began having this same right sided abdominal pain with N/V.  He denies fevers, diarrhea, chest pain, cough.  He states he had chills.  He admits to a small amount of weight loss after his last hospitalization, but nothing otherwise.  He returned to the ED where he then underwent an MRCP which revealed persistent irregular wall thickening of the gallbladder which could be secondary to cholecystitis vs malignancy.  He also has loculated ascites in the LUQ and perihepatic locations which is also nonspecific.  There is also patch enhancement of the omentum and the colonic mesentery.  These could represent infectious/inflammatory process, the possibility of malignant ascites and carcinomatosis could not be ruled out.    A repeat CT is recommended to further evaluate these findings given progression from last imaging a month ago.  He has never had a colonoscopy despite his brother having a history of colon cancer.   He denies any other family history of malignancy.  We have been asked to see him for further recommendations.  ROS: See above, otherwise other systems negative  Objective: Vital signs in last 24 hours: Temp:  [98.1 F (36.7 C)-98.2 F (36.8 C)] 98.2 F (36.8 C) (11/30 0352) Pulse Rate:  [91-135] 102 (11/30 1047) Resp:  [17-22] 20 (11/30 1047) BP: (117-171)/(73-135) 142/87 (11/30 1047) SpO2:  [90 %-98 %] 96 % (11/30 1047) Weight:  [81.9 kg] 81.9 kg (11/30 0125) Last BM Date: 09/01/19  Intake/Output from previous day: 11/29 0701 - 11/30 0700 In: 2438.8 [P.O.:20; I.V.:420.8; IV Piggyback:1998] Out: -  Intake/Output this shift: Total I/O In: -  Out: 175 [Urine:175]  PE: Gen: NAD Heart: regular Lungs: CTAB Abd: soft, but with some voluntary guarding to palpation throughout, but states greatest tenderness is on right side and in RUQ, some BS, mild bloating.  No masses, hernias, or organomegaly noted Ext: MAE, +2 radial and pedal pulses bilaterally Psych: A&O x3 with appropriate affect  Lab Results:  Recent Labs    09/02/19 1127 09/03/19 0304  WBC 16.2* 9.8  HGB 18.0* 16.4  HCT 53.1* 49.6  PLT 514* 406*   BMET Recent Labs    09/02/19 1127  NA 136  K 4.1  CL 102  CO2 21*  GLUCOSE 209*  BUN 29*  CREATININE 1.45*  CALCIUM 9.4   PT/INR No results for input(s): LABPROT,  INR in the last 72 hours. CMP     Component Value Date/Time   NA 136 09/02/2019 1127   K 4.1 09/02/2019 1127   CL 102 09/02/2019 1127   CO2 21 (L) 09/02/2019 1127   GLUCOSE 209 (H) 09/02/2019 1127   BUN 29 (H) 09/02/2019 1127   CREATININE 1.45 (H) 09/02/2019 1127   CALCIUM 9.4 09/02/2019 1127   PROT 7.5 09/02/2019 1127   ALBUMIN 4.0 09/02/2019 1127   AST 17 09/02/2019 1127   ALT 17 09/02/2019 1127   ALKPHOS 43 09/02/2019 1127   BILITOT 1.3 (H) 09/02/2019 1127   GFRNONAA 51 (L) 09/02/2019 1127   GFRAA 59 (L) 09/02/2019 1127   Lipase     Component Value Date/Time   LIPASE 55 (H)  09/02/2019 1127       Studies/Results: Mr 3d Recon At Scanner  Result Date: 09/02/2019 CLINICAL DATA:  Patient with possible pancreatitis. Gallbladder wall thickening on prior right upper quadrant ultrasound. EXAM: MRI ABDOMEN WITHOUT AND WITH CONTRAST (INCLUDING MRCP) TECHNIQUE: Multiplanar multisequence MR imaging of the abdomen was performed both before and after the administration of intravenous contrast. Heavily T2-weighted images of the biliary and pancreatic ducts were obtained, and three-dimensional MRCP images were rendered by post processing. CONTRAST:  9mL GADAVIST GADOBUTROL 1 MMOL/ML IV SOLN COMPARISON:  Ultrasound abdomen 09/01/2009; CT abdomen pelvis 08/04/2019 FINDINGS: Lower chest: Small bilateral pleural effusions. Consolidative opacities within the lower lungs bilaterally. Hepatobiliary: Liver is normal in size and contour. There is irregular wall thickening of the gallbladder. No cholelithiasis. Possible filling defect within the distal common bile duct on MRCP images (image 30; series 7). No intrahepatic or extrahepatic biliary ductal dilatation. Pancreas:  Unremarkable Spleen:  Unremarkable Adrenals/Urinary Tract: Normal adrenal glands. Kidneys enhance symmetrically with contrast. No hydronephrosis. Stomach/Bowel: Moderate sized hiatal hernia. Otherwise normal morphology of the stomach. No evidence for small bowel obstruction. Vascular/Lymphatic: Normal caliber abdominal aorta. No retroperitoneal lymphadenopathy. Other: There is ascites within the left upper quadrant. Additionally there is perihepatic fluid which appears loculated along the anterior hepatic margin (image 13; series 5). There is patchy enhancement scattered throughout the omentum and colonic mesentery within the central upper abdomen (image 57; series 1202). Musculoskeletal: No aggressive or acute appearing osseous lesions. IMPRESSION: 1. Persistent irregular wall thickening of the gallbladder which is nonspecific in  etiology. Findings may be secondary to acute cholecystitis in the appropriate clinical setting. The possibility of gallbladder malignancy is not excluded. 2. There appears to be loculated ascites within the left upper quadrant and within the perihepatic locations, nonspecific in etiology. Additionally, there is patchy enhancement involving the omentum and upper abdominal colonic mesentery. While these findings may be secondary to an acute infectious/inflammatory process, the possibility of omental carcinomatosis and/or malignant ascites is not entirely excluded, potentially if the gallbladder findings are secondary to a malignancy. 3. Given the multitude of findings, many of which have changed or progressed from prior CT, recommend further evaluation with contrast enhanced CT of the abdomen and pelvis. 4. There is narrowing of the distal aspect of the common bile duct on MRCP images which is likely artifactual in etiology. The possibility of a small distal CBD stone is not entirely excluded. Electronically Signed   By: Annia Belt M.D.   On: 09/02/2019 20:58   Dg Chest Port 1 View  Result Date: 09/03/2019 CLINICAL DATA:  Hypoxia. EXAM: PORTABLE CHEST 1 VIEW COMPARISON:  One-view chest x-ray 08/08/2019 FINDINGS: Heart size is exaggerated by low lung volumes. Moderate pulmonary vascular  congestion is present. Bilateral pleural effusions and basilar airspace opacities are noted. The visualized soft tissues and bony thorax are unremarkable. IMPRESSION: 1. Stable appearance of bilateral pleural effusions and basilar airspace disease. While this likely reflects atelectasis, infection is not excluded. 2. Moderate pulmonary vascular congestion. Electronically Signed   By: San Morelle M.D.   On: 09/03/2019 05:01   Mr Abdomen Mrcp Moise Boring Contast  Result Date: 09/02/2019 CLINICAL DATA:  Patient with possible pancreatitis. Gallbladder wall thickening on prior right upper quadrant ultrasound. EXAM: MRI ABDOMEN  WITHOUT AND WITH CONTRAST (INCLUDING MRCP) TECHNIQUE: Multiplanar multisequence MR imaging of the abdomen was performed both before and after the administration of intravenous contrast. Heavily T2-weighted images of the biliary and pancreatic ducts were obtained, and three-dimensional MRCP images were rendered by post processing. CONTRAST:  36mL GADAVIST GADOBUTROL 1 MMOL/ML IV SOLN COMPARISON:  Ultrasound abdomen 09/01/2009; CT abdomen pelvis 08/04/2019 FINDINGS: Lower chest: Small bilateral pleural effusions. Consolidative opacities within the lower lungs bilaterally. Hepatobiliary: Liver is normal in size and contour. There is irregular wall thickening of the gallbladder. No cholelithiasis. Possible filling defect within the distal common bile duct on MRCP images (image 30; series 7). No intrahepatic or extrahepatic biliary ductal dilatation. Pancreas:  Unremarkable Spleen:  Unremarkable Adrenals/Urinary Tract: Normal adrenal glands. Kidneys enhance symmetrically with contrast. No hydronephrosis. Stomach/Bowel: Moderate sized hiatal hernia. Otherwise normal morphology of the stomach. No evidence for small bowel obstruction. Vascular/Lymphatic: Normal caliber abdominal aorta. No retroperitoneal lymphadenopathy. Other: There is ascites within the left upper quadrant. Additionally there is perihepatic fluid which appears loculated along the anterior hepatic margin (image 13; series 5). There is patchy enhancement scattered throughout the omentum and colonic mesentery within the central upper abdomen (image 57; series 1202). Musculoskeletal: No aggressive or acute appearing osseous lesions. IMPRESSION: 1. Persistent irregular wall thickening of the gallbladder which is nonspecific in etiology. Findings may be secondary to acute cholecystitis in the appropriate clinical setting. The possibility of gallbladder malignancy is not excluded. 2. There appears to be loculated ascites within the left upper quadrant and within  the perihepatic locations, nonspecific in etiology. Additionally, there is patchy enhancement involving the omentum and upper abdominal colonic mesentery. While these findings may be secondary to an acute infectious/inflammatory process, the possibility of omental carcinomatosis and/or malignant ascites is not entirely excluded, potentially if the gallbladder findings are secondary to a malignancy. 3. Given the multitude of findings, many of which have changed or progressed from prior CT, recommend further evaluation with contrast enhanced CT of the abdomen and pelvis. 4. There is narrowing of the distal aspect of the common bile duct on MRCP images which is likely artifactual in etiology. The possibility of a small distal CBD stone is not entirely excluded. Electronically Signed   By: Lovey Newcomer M.D.   On: 09/02/2019 20:58   US Abdomen Limited Ruq  Result Date: 09/02/2019 CLINICAL DATA:  63 year old male with history of nausea, vomiting and abdominal pain. Under in both to take deep breaths. EXAM: ULTRASOUND ABDOMEN LIMITED RIGHT UPPER QUADRANT COMPARISON:  None. FINDINGS: Gallbladder: No gallstones. Gallbladder wall appears mildly thickened ranging from 5-7 mm in thickness. No pericholecystic fluid. No sonographic Murphy sign noted by sonographer. Common bile duct: Diameter: 3 mm Liver: No focal lesion identified. Within normal limits in parenchymal echogenicity. Portal vein is patent on color Doppler imaging with normal direction of blood flow towards the liver. Other: None. IMPRESSION: 1. Gallbladder wall appears mildly thickened. This is of uncertain etiology and significance.  No gallstones or findings to suggest an acute cholecystitis are noted at this time. Follow-up right upper quadrant abdominal ultrasound is suggested in 2 weeks to ensure resolution of this finding. Should this finding not resolve, further evaluation with abdominal MRI with and without IV gadolinium with MRCP would be suggested to  exclude the possibility of gallbladder malignancy. Electronically Signed   By: Trudie Reed M.D.   On: 09/02/2019 13:13    Anti-infectives: Anti-infectives (From admission, onward)   None       Assessment/Plan H/o heavy ETOH abuse with recent DTs - states he has abstained since recent admission HTN   Abdominal pain with irregular appearing gallbladder  The patient has an irregularity of his gallbladder wall noted on his MRCP.  He underwent extensive work up for his gallbladder for similar appearance just a couple of weeks ago and all was negative, including a HIDA scan.  He has some new somewhat concerning findings on his MRCP which need to be followed.    I will order a new CT A/P to further evaluate this ascites and his omentum.  A CA 19-9, AFP have been ordered as well.  WBC was 16K on admit, but has normalized today with no abx therapy.  Would not recommend abx at this time given no definitive evidence of infection.    The patient will need this further work up prior to determining if he needs surgical intervention.  Further recommendations will be made once his scan is completed.  FEN - ok for CLD from our standpoint VTE - ok for chemical prophylaxis from our [facility] ID - none currently   LOS: 0 days    Letha Cape , [facility] 09/03/2019, 11:39 AM Please see Amion for pager number during day hours 7:00am-4:30pm  Agree with above. Repeat CT scan shows development of perihepatic ascites and irregular omental and peritoneal stranding.  There is irregular gall bladder wall thickening. Note, he had a normal HIDA scan 08/05/2019.  This may need to be repeated, though the patient was not in favor of this.  His wife, Joyce Gross, is at the bedside.  The lack of answers are frustrating to them.  Will follow.  Ovidio Kin, MD, [facility] Office phone:  [phone]
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
## 2019-09-03 NOTE — Progress Notes (Signed)
Pharmacy Antibiotic Note  VICENTE WEIDLER is a 63 y.o. male admitted on 09/02/2019 with abdominal pain.  Pharmacy has been consulted for Zosyn dosing for intra-abdominal infection.  Plan: Zosyn 3.375g IV x 1 over 30 minutes, then Zosyn 3.375g IV q8h (each dose infused over 4 hours). Monitor renal function, cultures, clinical course.    Height: 5\' 10"  (177.8 cm) Weight: 180 lb 8.9 oz (81.9 kg) IBW/kg (Calculated) : 73  Temp (24hrs), Avg:98.4 F (36.9 C), Min:98 F (36.7 C), Max:99.1 F (37.3 C)  Recent Labs  Lab 09/02/19 1127 09/02/19 1601 09/03/19 0019 09/03/19 0304 09/03/19 1220  WBC 16.2*  --   --  9.8  --   CREATININE 1.45*  --   --   --  1.68*  LATICACIDVEN  --  1.2 1.7  --   --     Estimated Creatinine Clearance: 47.1 mL/min (A) (by C-G formula based on SCr of 1.68 mg/dL (H)).    No Known Allergies  Antimicrobials this admission: 11/30 Zosyn >>  Dose adjustments this admission: --  Microbiology results: 11/30 COVID: negative   Thank you for allowing pharmacy to be a part of this patient's care.  Luiz Ochoa 09/03/2019 6:29 PM

## 2019-09-03 NOTE — Progress Notes (Signed)
PROGRESS NOTE    Corey Logan  ZOX:[id number] DOB: May 20, 1956 DOA: 09/02/2019 PCP: Elias Else, MD   Brief Narrative:  Per HPI: Corey Logan is a 63 y.o. male with medical history significant of arthritis, depression, GERD, hiatal hernia, hypertension alcohol dependence  Presented with    nausea vomiting abdominal pain stating it is his gallbladder that did not get removed during prior admission She states she developed right upper quadrant pain after he ate eggs and steak yesterday radiating to his right and left shoulder.  Its been constant feels like squeezing.  He has had a few episodes of nausea and vomiting felt dehydrated. States that he quit alcohol and have not had any for the past 1 month. No fever no urinary complaints no diarrhea no constipation.  Admitted on 05 July 2019 with right upper quadrant abdominal pain Was work-up a CT scan that showed gallbladder thickening right upper quadrant ultrasound showed no evidence of gallstones or significant abdomen CBD HIDA scan showed normal biliary patency and normal ejection fraction.  General surgery seen patient and felt that his symptoms were not related to his gallbladder disease.  Recommended GI to evaluate for EGD.  While hospitalized patient developed acute alcohol withdrawal syndrome   GI series showed small sliding hiatal hernia and evidence of GERD no evidence of mass or stricture mild esophageal dysmotility  General surgery seen patient thought there was no indication for cholecystectomy as his pain is likely unrelated to his gallbladder  Per history obtained from spouse he admitted to drinking 14 beers a day. Low-grade fever blood cultures did not grow anything. Episode of somnolence and confusion felt to be alcohol related he had an EEG done showed mild to moderate diffuse encephalopathy nonspecific CT head was unremarkable his thiamine was replaced high dose. His UDS was positive for benzos.  11/30:  Patient continues to have abdominal pain and some nausea this am. No emesis otherwise noted. Will maintain on IVF and try clear liquids today. Appreciate GS evaluation with plans for CT abdomen evaulation as well as AFP and CA 19-9 added. CA 125 pending.  Assessment & Plan:   Active Problems:   Right upper quadrant abdominal pain   HTN (hypertension)   RUQ pain   Hyperglycemia   Abnormal MRI of abdomen  RUQ abdominal pain with irregular appearance of gallbladder -Irregularity and concerning findings noted on MRCP -Repeat CT A/P per GS along with CA 19-9, AFP, CA 125 pending -Leukocytosis improved; no abx for now -Trial CLD today and maintain on IVF  Acute hypoxemic respiratory failure -Noted effusions/atelectasis with pulmonary vasc congestion -COVID negative -D dimer 3.71, will check V/Q scan -Incentive spirometry  HTN -Controlled with no meds at baseline -Labetalol prn  Hx of alcohol abuse -CIWA precautions  Hyperglycemia -A1c 5.6% -TSH 3.9 -Continue SSI -Monitor closely  AKI (baseline 0.7-0.8) -Continue IVF hydration -Monitor I/Os -Avoid nephrotoxic agents -Urine lytes -CT scan pending  DVT prophylaxis:SCDs to heparin Code Status: Full Family Communication: None at bedside Disposition Plan: Per GS, Check CT scan and other labwork, consideration for fluid analysis vs biopsy if indicated   Consultants:   General surgery  Procedures:   None  Antimicrobials:  Anti-infectives (From admission, onward)   None       Subjective: Patient seen and evaluated today with ongoing abdominal pain complaints along with some nausea. No emesis noted. No BM.  Objective: Vitals:   09/03/19 0125 09/03/19 0138 09/03/19 0210 09/03/19 0352  BP:  (!) 147/87  Marland Kitchen)  136/92  Pulse:  (!) 111 99 (!) 106  Resp:  (!) 22  18  Temp:  98.1 F (36.7 C)  98.2 F (36.8 C)  TempSrc:  Oral  Oral  SpO2:  97%  98%  Weight: 81.9 kg     Height: 5\' 10"  (1.778 m)        Intake/Output Summary (Last 24 hours) at 09/03/2019 0713 Last data filed at 09/03/2019 0600 Gross per 24 hour  Intake 2438.75 ml  Output --  Net 2438.75 ml   Filed Weights   09/03/19 0125  Weight: 81.9 kg    Examination:  General exam: Appears calm and comfortable  Respiratory system: Clear to auscultation. Respiratory effort normal. On 2L [place]. Cardiovascular system: S1 & S2 heard, RRR. No JVD, murmurs, rubs, gallops or clicks. No pedal edema. Gastrointestinal system: Abdomen is nondistended, soft and tender to palpation over RUQ. No organomegaly or masses felt. Normal bowel sounds heard. Central nervous system: Alert and oriented. No focal neurological deficits. Extremities: Symmetric 5 x 5 power. Skin: No rashes, lesions or ulcers Psychiatry: Judgement and insight appear normal. Mood & affect appropriate.     Data Reviewed: I have personally reviewed following labs and imaging studies  CBC: Recent Labs  Lab 09/02/19 1127 09/03/19 0304  WBC 16.2* 9.8  HGB 18.0* 16.4  HCT 53.1* 49.6  MCV 91.6 92.5  PLT 514* 406*   Basic Metabolic Panel: Recent Labs  Lab 09/02/19 1127 09/03/19 0304  NA 136  --   K 4.1  --   CL 102  --   CO2 21*  --   GLUCOSE 209*  --   BUN 29*  --   CREATININE 1.45*  --   CALCIUM 9.4  --   MG  --  2.2  PHOS  --  3.4   GFR: Estimated Creatinine Clearance: 54.5 mL/min (A) (by C-G formula based on SCr of 1.45 mg/dL (H)). Liver Function Tests: Recent Labs  Lab 09/02/19 1127  AST 17  ALT 17  ALKPHOS 43  BILITOT 1.3*  PROT 7.5  ALBUMIN 4.0   Recent Labs  Lab 09/02/19 1127  LIPASE 55*   No results for input(s): AMMONIA in the last 168 hours. Coagulation Profile: No results for input(s): INR, PROTIME in the last 168 hours. Cardiac Enzymes: No results for input(s): CKTOTAL, CKMB, CKMBINDEX, TROPONINI in the last 168 hours. BNP (last 3 results) No results for input(s): PROBNP in the last 8760 hours. HbA1C: Recent Labs     09/03/19 0304  HGBA1C 5.6   CBG: Recent Labs  Lab 09/03/19 0136 09/03/19 0348  GLUCAP 206* 168*   Lipid Profile: No results for input(s): CHOL, HDL, LDLCALC, TRIG, CHOLHDL, LDLDIRECT in the last 72 hours. Thyroid Function Tests: Recent Labs    09/03/19 0304  TSH 3.900   Anemia Panel: Recent Labs    09/02/19 1127  FERRITIN 318   Sepsis Labs: Recent Labs  Lab 09/02/19 1601 09/03/19 0019  LATICACIDVEN 1.2 1.7    No results found for this or any previous visit (from the past 240 hour(s)).       Radiology Studies: Mr 3d Recon At Scanner  Result Date: 09/02/2019 CLINICAL DATA:  Patient with possible pancreatitis. Gallbladder wall thickening on prior right upper quadrant ultrasound. EXAM: MRI ABDOMEN WITHOUT AND WITH CONTRAST (INCLUDING MRCP) TECHNIQUE: Multiplanar multisequence MR imaging of the abdomen was performed both before and after the administration of intravenous contrast. Heavily T2-weighted images of the biliary and pancreatic ducts were  obtained, and three-dimensional MRCP images were rendered by post processing. CONTRAST:  71mL GADAVIST GADOBUTROL 1 MMOL/ML IV SOLN COMPARISON:  Ultrasound abdomen 09/01/2009; CT abdomen pelvis 08/04/2019 FINDINGS: Lower chest: Small bilateral pleural effusions. Consolidative opacities within the lower lungs bilaterally. Hepatobiliary: Liver is normal in size and contour. There is irregular wall thickening of the gallbladder. No cholelithiasis. Possible filling defect within the distal common bile duct on MRCP images (image 30; series 7). No intrahepatic or extrahepatic biliary ductal dilatation. Pancreas:  Unremarkable Spleen:  Unremarkable Adrenals/Urinary Tract: Normal adrenal glands. Kidneys enhance symmetrically with contrast. No hydronephrosis. Stomach/Bowel: Moderate sized hiatal hernia. Otherwise normal morphology of the stomach. No evidence for small bowel obstruction. Vascular/Lymphatic: Normal caliber abdominal aorta. No  retroperitoneal lymphadenopathy. Other: There is ascites within the left upper quadrant. Additionally there is perihepatic fluid which appears loculated along the anterior hepatic margin (image 13; series 5). There is patchy enhancement scattered throughout the omentum and colonic mesentery within the central upper abdomen (image 57; series 1202). Musculoskeletal: No aggressive or acute appearing osseous lesions. IMPRESSION: 1. Persistent irregular wall thickening of the gallbladder which is nonspecific in etiology. Findings may be secondary to acute cholecystitis in the appropriate clinical setting. The possibility of gallbladder malignancy is not excluded. 2. There appears to be loculated ascites within the left upper quadrant and within the perihepatic locations, nonspecific in etiology. Additionally, there is patchy enhancement involving the omentum and upper abdominal colonic mesentery. While these findings may be secondary to an acute infectious/inflammatory process, the possibility of omental carcinomatosis and/or malignant ascites is not entirely excluded, potentially if the gallbladder findings are secondary to a malignancy. 3. Given the multitude of findings, many of which have changed or progressed from prior CT, recommend further evaluation with contrast enhanced CT of the abdomen and pelvis. 4. There is narrowing of the distal aspect of the common bile duct on MRCP images which is likely artifactual in etiology. The possibility of a small distal CBD stone is not entirely excluded. Electronically Signed   By: Annia Belt M.D.   On: 09/02/2019 20:58   Dg Chest Port 1 View  Result Date: 09/03/2019 CLINICAL DATA:  Hypoxia. EXAM: PORTABLE CHEST 1 VIEW COMPARISON:  One-view chest x-ray 08/08/2019 FINDINGS: Heart size is exaggerated by low lung volumes. Moderate pulmonary vascular congestion is present. Bilateral pleural effusions and basilar airspace opacities are noted. The visualized soft tissues and  bony thorax are unremarkable. IMPRESSION: 1. Stable appearance of bilateral pleural effusions and basilar airspace disease. While this likely reflects atelectasis, infection is not excluded. 2. Moderate pulmonary vascular congestion. Electronically Signed   By: Marin Roberts M.D.   On: 09/03/2019 05:01   Mr Abdomen Mrcp Vivien Rossetti Contast  Result Date: 09/02/2019 CLINICAL DATA:  Patient with possible pancreatitis. Gallbladder wall thickening on prior right upper quadrant ultrasound. EXAM: MRI ABDOMEN WITHOUT AND WITH CONTRAST (INCLUDING MRCP) TECHNIQUE: Multiplanar multisequence MR imaging of the abdomen was performed both before and after the administration of intravenous contrast. Heavily T2-weighted images of the biliary and pancreatic ducts were obtained, and three-dimensional MRCP images were rendered by post processing. CONTRAST:  47mL GADAVIST GADOBUTROL 1 MMOL/ML IV SOLN COMPARISON:  Ultrasound abdomen 09/01/2009; CT abdomen pelvis 08/04/2019 FINDINGS: Lower chest: Small bilateral pleural effusions. Consolidative opacities within the lower lungs bilaterally. Hepatobiliary: Liver is normal in size and contour. There is irregular wall thickening of the gallbladder. No cholelithiasis. Possible filling defect within the distal common bile duct on MRCP images (image 30; series 7). No  intrahepatic or extrahepatic biliary ductal dilatation. Pancreas:  Unremarkable Spleen:  Unremarkable Adrenals/Urinary Tract: Normal adrenal glands. Kidneys enhance symmetrically with contrast. No hydronephrosis. Stomach/Bowel: Moderate sized hiatal hernia. Otherwise normal morphology of the stomach. No evidence for small bowel obstruction. Vascular/Lymphatic: Normal caliber abdominal aorta. No retroperitoneal lymphadenopathy. Other: There is ascites within the left upper quadrant. Additionally there is perihepatic fluid which appears loculated along the anterior hepatic margin (image 13; series 5). There is patchy enhancement  scattered throughout the omentum and colonic mesentery within the central upper abdomen (image 57; series 1202). Musculoskeletal: No aggressive or acute appearing osseous lesions. IMPRESSION: 1. Persistent irregular wall thickening of the gallbladder which is nonspecific in etiology. Findings may be secondary to acute cholecystitis in the appropriate clinical setting. The possibility of gallbladder malignancy is not excluded. 2. There appears to be loculated ascites within the left upper quadrant and within the perihepatic locations, nonspecific in etiology. Additionally, there is patchy enhancement involving the omentum and upper abdominal colonic mesentery. While these findings may be secondary to an acute infectious/inflammatory process, the possibility of omental carcinomatosis and/or malignant ascites is not entirely excluded, potentially if the gallbladder findings are secondary to a malignancy. 3. Given the multitude of findings, many of which have changed or progressed from prior CT, recommend further evaluation with contrast enhanced CT of the abdomen and pelvis. 4. There is narrowing of the distal aspect of the common bile duct on MRCP images which is likely artifactual in etiology. The possibility of a small distal CBD stone is not entirely excluded. Electronically Signed   By: Lovey Newcomer M.D.   On: 09/02/2019 20:58   US Abdomen Limited Ruq  Result Date: 09/02/2019 CLINICAL DATA:  63 year old male with history of nausea, vomiting and abdominal pain. Under in both to take deep breaths. EXAM: ULTRASOUND ABDOMEN LIMITED RIGHT UPPER QUADRANT COMPARISON:  None. FINDINGS: Gallbladder: No gallstones. Gallbladder wall appears mildly thickened ranging from 5-7 mm in thickness. No pericholecystic fluid. No sonographic Murphy sign noted by sonographer. Common bile duct: Diameter: 3 mm Liver: No focal lesion identified. Within normal limits in parenchymal echogenicity. Portal vein is patent on color Doppler  imaging with normal direction of blood flow towards the liver. Other: None. IMPRESSION: 1. Gallbladder wall appears mildly thickened. This is of uncertain etiology and significance. No gallstones or findings to suggest an acute cholecystitis are noted at this time. Follow-up right upper quadrant abdominal ultrasound is suggested in 2 weeks to ensure resolution of this finding. Should this finding not resolve, further evaluation with abdominal MRI with and without IV gadolinium with MRCP would be suggested to exclude the possibility of gallbladder malignancy. Electronically Signed   By: Vinnie Langton M.D.   On: 09/02/2019 13:13        Scheduled Meds:  insulin aspart  0-9 Units Subcutaneous Q4H   Continuous Infusions:  sodium chloride 100 mL/hr at 09/03/19 0146     LOS: 0 days    Time spent: 30 minutes    Rashay Barnette Darleen Crocker, DO [facility] Pager [phone]  If 7PM-7AM, please contact night-coverage [email or web address] Password [email or web address] 09/03/2019, 7:13 AM

## 2019-09-04 ENCOUNTER — Inpatient Hospital Stay (HOSPITAL_COMMUNITY): Payer: PPO

## 2019-09-04 DIAGNOSIS — N179 Acute kidney failure, unspecified: Secondary | ICD-10-CM

## 2019-09-04 DIAGNOSIS — K449 Diaphragmatic hernia without obstruction or gangrene: Secondary | ICD-10-CM

## 2019-09-04 DIAGNOSIS — J9601 Acute respiratory failure with hypoxia: Secondary | ICD-10-CM

## 2019-09-04 DIAGNOSIS — K219 Gastro-esophageal reflux disease without esophagitis: Secondary | ICD-10-CM

## 2019-09-04 LAB — COMPREHENSIVE METABOLIC PANEL
ALT: 8 U/L (ref 0–44)
AST: 18 U/L (ref 15–41)
Albumin: 2.7 g/dL — ABNORMAL LOW (ref 3.5–5.0)
Alkaline Phosphatase: 38 U/L (ref 38–126)
Anion gap: 10 (ref 5–15)
BUN: 47 mg/dL — ABNORMAL HIGH (ref 8–23)
CO2: 20 mmol/L — ABNORMAL LOW (ref 22–32)
Calcium: 8.1 mg/dL — ABNORMAL LOW (ref 8.9–10.3)
Chloride: 104 mmol/L (ref 98–111)
Creatinine, Ser: 1.58 mg/dL — ABNORMAL HIGH (ref 0.61–1.24)
GFR calc Af Amer: 54 mL/min — ABNORMAL LOW (ref >60)
GFR calc non Af Amer: 46 mL/min — ABNORMAL LOW (ref >60)
Glucose, Bld: 130 mg/dL — ABNORMAL HIGH (ref 70–99)
Potassium: 4.7 mmol/L (ref 3.5–5.1)
Sodium: 134 mmol/L — ABNORMAL LOW (ref 135–145)
Total Bilirubin: 2 mg/dL — ABNORMAL HIGH (ref 0.3–1.2)
Total Protein: 5.6 g/dL — ABNORMAL LOW (ref 6.5–8.1)

## 2019-09-04 LAB — GLUCOSE, CAPILLARY
Glucose-Capillary: 112 mg/dL — ABNORMAL HIGH (ref 70–99)
Glucose-Capillary: 117 mg/dL — ABNORMAL HIGH (ref 70–99)
Glucose-Capillary: 120 mg/dL — ABNORMAL HIGH (ref 70–99)
Glucose-Capillary: 123 mg/dL — ABNORMAL HIGH (ref 70–99)
Glucose-Capillary: 124 mg/dL — ABNORMAL HIGH (ref 70–99)
Glucose-Capillary: 126 mg/dL — ABNORMAL HIGH (ref 70–99)

## 2019-09-04 LAB — SURGICAL PCR SCREEN
MRSA, PCR: NEGATIVE
Staphylococcus aureus: NEGATIVE

## 2019-09-04 LAB — CBC
HCT: 44.9 % (ref 39.0–52.0)
Hemoglobin: 14.4 g/dL (ref 13.0–17.0)
MCH: 30.4 pg (ref 26.0–34.0)
MCHC: 32.1 g/dL (ref 30.0–36.0)
MCV: 94.7 fL (ref 80.0–100.0)
Platelets: 357 10*3/uL (ref 150–400)
RBC: 4.74 MIL/uL (ref 4.22–5.81)
RDW: 12.5 % (ref 11.5–15.5)
WBC: 7.4 10*3/uL (ref 4.0–10.5)
nRBC: 0 % (ref 0.0–0.2)

## 2019-09-04 LAB — CREATININE, URINE, RANDOM: Creatinine, Urine: 198.23 mg/dL

## 2019-09-04 LAB — SODIUM, URINE, RANDOM: Sodium, Ur: <10 mmol/L

## 2019-09-04 LAB — CANCER ANTIGEN 19-9: CA 19-9: 8 U/mL (ref 0–35)

## 2019-09-04 LAB — CA 125: Cancer Antigen (CA) 125: 61.1 U/mL

## 2019-09-04 LAB — AFP TUMOR MARKER: AFP, Serum, Tumor Marker: 1.1 ng/mL (ref 0.0–8.3)

## 2019-09-04 LAB — BRAIN NATRIURETIC PEPTIDE: B Natriuretic Peptide: 41 pg/mL (ref 0.0–100.0)

## 2019-09-04 LAB — MAGNESIUM: Magnesium: 2.4 mg/dL (ref 1.7–2.4)

## 2019-09-04 MED ORDER — MUPIROCIN 2 % EX OINT
1.0000 "application " | TOPICAL_OINTMENT | Freq: Two times a day (BID) | CUTANEOUS | Status: DC
  Filled 2019-09-04: qty 22

## 2019-09-04 MED ORDER — TECHNETIUM TC 99M MEBROFENIN IV KIT
7.8000 | PACK | Freq: Once | INTRAVENOUS | Status: AC | PRN
  Administered 2019-09-04: 7.8 via INTRAVENOUS

## 2019-09-04 NOTE — Progress Notes (Signed)
Subjective: Ongoing abdominal pain.  Objective: Vital signs in last 24 hours: Temp:  [98 F (36.7 C)-99.4 F (37.4 C)] 99.4 F (37.4 C) (12/01 0410) Pulse Rate:  [93-109] 97 (12/01 0701) Resp:  [16-20] 20 (12/01 0410) BP: (133-178)/(68-101) 151/87 (12/01 0701) SpO2:  [96 %-99 %] 97 % (12/01 0410) Weight change:  Last BM Date: 09/01/19  PE: GEN:  NAD, somnolent  Lab Results: CBC    Component Value Date/Time   WBC 7.4 09/04/2019 0127   RBC 4.74 09/04/2019 0127   HGB 14.4 09/04/2019 0127   HCT 44.9 09/04/2019 0127   PLT 357 09/04/2019 0127   MCV 94.7 09/04/2019 0127   MCH 30.4 09/04/2019 0127   MCHC 32.1 09/04/2019 0127   RDW 12.5 09/04/2019 0127   LYMPHSABS 1.2 08/11/2019 0839   MONOABS 0.7 08/11/2019 0839   EOSABS 0.4 08/11/2019 0839   BASOSABS 0.0 08/11/2019 0839   CMP     Component Value Date/Time   NA 134 (L) 09/04/2019 0127   K 4.7 09/04/2019 0127   CL 104 09/04/2019 0127   CO2 20 (L) 09/04/2019 0127   GLUCOSE 130 (H) 09/04/2019 0127   BUN 47 (H) 09/04/2019 0127   CREATININE 1.58 (H) 09/04/2019 0127   CALCIUM 8.1 (L) 09/04/2019 0127   PROT 5.6 (L) 09/04/2019 0127   ALBUMIN 2.7 (L) 09/04/2019 0127   AST 18 09/04/2019 0127   ALT 8 09/04/2019 0127   ALKPHOS 38 09/04/2019 0127   BILITOT 2.0 (H) 09/04/2019 0127   GFRNONAA 46 (L) 09/04/2019 0127   GFRAA 54 (L) 09/04/2019 0127   Assessment:  1.  RUQ pain, recurrent. 2.  Weight loss. 3.  Abnormal imaging, U/S + MRCP.  Ascites, GB wall thickening, omental thickening.  Plan:  1.  Case discussed with Dr. Lucia Gaskins and [facility] team. 2.  Patient to have paracentesis (diagnostic + therapeutic) and HIDA scan for further work-up. 3.  Further surgical work-up, possible laparoscopy, possible cholecystectomy, pending results findings paracentesis and HIDA scan. 4.  [facility] will follow.    Landry Dyke 09/04/2019, 10:43 AM   Cell [phone] If no answer or after 5 PM call [phone]

## 2019-09-04 NOTE — Progress Notes (Addendum)
Subjective: Patient states he feels a little better today.  Still needing some nausea medicine, but no emesis.  He is spitting up some white phlegm looking stuff.  No other new complaints  ROS: See above, otherwise other systems negative  Objective: Vital signs in last 24 hours: Temp:  [98.1 F (36.7 C)-99.4 F (37.4 C)] 99.4 F (37.4 C) (12/01 0410) Pulse Rate:  [93-109] 97 (12/01 0701) Resp:  [16-20] 20 (12/01 0410) BP: (133-178)/(68-101) 151/87 (12/01 0701) SpO2:  [97 %-99 %] 97 % (12/01 0410) Last BM Date: 09/01/19  Intake/Output from previous day: 11/30 0701 - 12/01 0700 In: 2869.6 [P.O.:50; I.V.:2719.7; IV Piggyback:99.9] Out: 630 [Urine:630] Intake/Output this shift: Total I/O In: -  Out: 250 [Urine:250]  PE: Gen: NAD Heart: regular Lungs: CTAB Abd: softer today than yesterday and slightly less tenderness with less voluntary guarding with palpation today.  Pain is more diffuse and not just on right side.  Hypoactive BS, mild bloating. Psych: A&Ox3  Lab Results:  Recent Labs    09/03/19 0304 09/04/19 0127  WBC 9.8 7.4  HGB 16.4 14.4  HCT 49.6 44.9  PLT 406* 357   BMET Recent Labs    09/03/19 1220 09/04/19 0127  NA 137 134*  K 4.6 4.7  CL 108 104  CO2 20* 20*  GLUCOSE 154* 130*  BUN 47* 47*  CREATININE 1.68* 1.58*  CALCIUM 8.2* 8.1*   PT/INR No results for input(s): LABPROT, INR in the last 72 hours. CMP     Component Value Date/Time   NA 134 (L) 09/04/2019 0127   K 4.7 09/04/2019 0127   CL 104 09/04/2019 0127   CO2 20 (L) 09/04/2019 0127   GLUCOSE 130 (H) 09/04/2019 0127   BUN 47 (H) 09/04/2019 0127   CREATININE 1.58 (H) 09/04/2019 0127   CALCIUM 8.1 (L) 09/04/2019 0127   PROT 5.6 (L) 09/04/2019 0127   ALBUMIN 2.7 (L) 09/04/2019 0127   AST 18 09/04/2019 0127   ALT 8 09/04/2019 0127   ALKPHOS 38 09/04/2019 0127   BILITOT 2.0 (H) 09/04/2019 0127   GFRNONAA 46 (L) 09/04/2019 0127   GFRAA 54 (L) 09/04/2019 0127   Lipase       Component Value Date/Time   LIPASE 55 (H) 09/02/2019 1127       Studies/Results: Ct Angio Chest Pe W Or Wo Contrast  Result Date: 09/03/2019 CLINICAL DATA:  Abdominal pain, MRI suggesting omental caking. EXAM: CT CHEST CTA protocol of the chest and CT of the ABDOMEN, AND PELVIS WITH CONTRAST TECHNIQUE: Multidetector CT imaging of the chest utilizing CTA, PE protocol, and abdomen and pelvis was performed following the standard protocol during bolus administration of intravenous contrast. Maximum intensity projection reconstructions were generated, submitted and were reviewed. CONTRAST:  80mL OMNIPAQUE IOHEXOL 350 MG/ML SOLN COMPARISON:  MRI 09/02/2019 prior CT assessments dating back to October of 2020 FINDINGS: CT CHEST FINDINGS Cardiovascular: Scattered aortic atherosclerosis. Mild dilation of ascending thoracic aorta 3.9 cm. Heart size is mildly enlarged without pericardial fluid. No signs of pulmonary embolism. Mediastinum/Nodes: No signs of mediastinal lymphadenopathy. Small amount of loculated fluid adjacent to a small hiatal hernia. Fluid is contiguous with hepatic gastric in left subdiaphragmatic fluid. No signs of adenopathy in the chest. Lungs/Pleura: Basilar collapse/consolidation bilaterally. Small bilateral pleural effusions. Airways are patent. Musculoskeletal: No signs of chest wall mass. No signs of acute bone finding or evidence of destructive bone process. No signs of chest wall mass. CT ABDOMEN PELVIS FINDINGS Hepatobiliary:  Interval development of extensive perihepatic fluid with scalloping of the a Paddock margin. Fluid extends into the hepatic gastric recess, left subdiaphragmatic space along the right hemi liver and in the right pericolic gutter. Also tracking into the pelvis density between 15 and 20 Hounsfield units, nonspecific. Diffuse irregular gallbladder wall thickening. Gallbladder is less distended than on the previous exam. No focal mass lesion in the gallbladder.  Pancreas: No signs of acute interstitial pancreatitis, ductal dilation or lesion. Spleen: Normal in size without focal abnormality. Adrenals/Urinary Tract: Normal adrenal glands. Symmetric enhancement of the bilateral kidneys. Stomach/Bowel: Small hiatal hernia. Signs of interloop fluid along the jejunum. Developing bowel distension since the MRI of 09/02/2019. Gradual transition in the mid abdomen but with decompressed distal small bowel loops. No signs of pneumatosis. Diffuse mesenteric edema. Vascular/Lymphatic: Calcified and noncalcified atherosclerotic plaque in the abdominal aorta. No signs of aneurysm or acute abdominal vascular abnormality. The portal vein is patent. The splenic vein is patent. No signs of adenopathy in the upper abdomen or retroperitoneum. No signs of pelvic lymphadenopathy. Reproductive: Nonspecific appearance of the prostate by CT. Other: Perihepatic and upper abdominal fluid. Fluid along the left hepatic margin with scalloping of the left hemi liver measures 8 x 8 cm. Fluid in the left subdiaphragmatic area adjacent the spleen measures 14 by 4 cm. Right flank fluid 6.8 by 2.8 cm. Smaller pockets of fluid along the anterior surface of the liver. Diffuse omental stranding or nodularity. Interloop fluid adjacent to jejunal loops in the left abdomen 5.1 x 2.2 cm. No signs of free air. Musculoskeletal: No signs of acute bone finding or evidence of destructive bone process. IMPRESSION: 1. Interval development of extensive perihepatic fluid with scalloping of the hepatic margin, associated with diffuse ascites, some areas with more loculated appearance and with interloop fluid in the left hemiabdomen. Also associated with irregular omental and peritoneal stranding along with bowel edema. Constellation of findings could be seen in the setting of peritonitis, rapid development favors this alternative. Secondarily malignant ascites could also have this appearance. Diagnostic paracentesis could be  helpful. 2. Marked irregular gallbladder wall thickening, gallbladder is less distended than on the previous exam. Gallbladder compromise cannot be proven on the basis of this study though biliary peritonitis is certainly in the differential based on the appearance of the abdominal fluid. Acute on chronic cholecystitis with gallbladder compromise is considered. Based on gallbladder wall thickening neoplasm remains in the differential. 3. No signs of extraluminal contrast or other findings that would support bowel leak or perforated ulcer as a cause for diffuse peritonitis. 4. No signs of pulmonary embolism. 5. Dense material in collapsed lung at the lung bases likely iodinated contrast, potentially aspirated by this patient with basilar airspace disease and small effusions. Aortic Atherosclerosis (ICD10-I70.0). Electronically Signed   By: Donzetta Kohut M.D.   On: 09/03/2019 18:05   Nm Pulmonary Perfusion  Result Date: 09/03/2019 CLINICAL DATA:  Inpatient.  Elevated D-dimer.  Abdominal pain. EXAM: NUCLEAR MEDICINE PERFUSION LUNG SCAN TECHNIQUE: Perfusion images were obtained in multiple projections after intravenous injection of radiopharmaceutical. Ventilation scans were attempted but not tolerated by the patient (patient vomited after breathing apparatus placed). RADIOPHARMACEUTICALS:  1.5 mCi Tc-46m MAA IV COMPARISON:  Chest radiograph from earlier today. FINDINGS: There are multiple small moderate perfusion defects at posterior lung bases bilaterally, correlating with areas of airspace disease on the chest radiograph from earlier today. IMPRESSION: Perfusion-only scan, see comments. Multiple small to moderate perfusion defects at the posterior lung bases bilaterally with  chest radiograph correlates. Findings are considered intermediate probability for acute pulmonary embolism. Electronically Signed   By: Delbert Phenix M.D.   On: 09/03/2019 16:52   Ct Abdomen Pelvis W Contrast  Result Date:  09/03/2019 CLINICAL DATA:  Abdominal pain, MRI suggesting omental caking. EXAM: CT CHEST CTA protocol of the chest and CT of the ABDOMEN, AND PELVIS WITH CONTRAST TECHNIQUE: Multidetector CT imaging of the chest utilizing CTA, PE protocol, and abdomen and pelvis was performed following the standard protocol during bolus administration of intravenous contrast. Maximum intensity projection reconstructions were generated, submitted and were reviewed. CONTRAST:  80mL OMNIPAQUE IOHEXOL 350 MG/ML SOLN COMPARISON:  MRI 09/02/2019 prior CT assessments dating back to October of 2020 FINDINGS: CT CHEST FINDINGS Cardiovascular: Scattered aortic atherosclerosis. Mild dilation of ascending thoracic aorta 3.9 cm. Heart size is mildly enlarged without pericardial fluid. No signs of pulmonary embolism. Mediastinum/Nodes: No signs of mediastinal lymphadenopathy. Small amount of loculated fluid adjacent to a small hiatal hernia. Fluid is contiguous with hepatic gastric in left subdiaphragmatic fluid. No signs of adenopathy in the chest. Lungs/Pleura: Basilar collapse/consolidation bilaterally. Small bilateral pleural effusions. Airways are patent. Musculoskeletal: No signs of chest wall mass. No signs of acute bone finding or evidence of destructive bone process. No signs of chest wall mass. CT ABDOMEN PELVIS FINDINGS Hepatobiliary: Interval development of extensive perihepatic fluid with scalloping of the a Paddock margin. Fluid extends into the hepatic gastric recess, left subdiaphragmatic space along the right hemi liver and in the right pericolic gutter. Also tracking into the pelvis density between 15 and 20 Hounsfield units, nonspecific. Diffuse irregular gallbladder wall thickening. Gallbladder is less distended than on the previous exam. No focal mass lesion in the gallbladder. Pancreas: No signs of acute interstitial pancreatitis, ductal dilation or lesion. Spleen: Normal in size without focal abnormality. Adrenals/Urinary  Tract: Normal adrenal glands. Symmetric enhancement of the bilateral kidneys. Stomach/Bowel: Small hiatal hernia. Signs of interloop fluid along the jejunum. Developing bowel distension since the MRI of 09/02/2019. Gradual transition in the mid abdomen but with decompressed distal small bowel loops. No signs of pneumatosis. Diffuse mesenteric edema. Vascular/Lymphatic: Calcified and noncalcified atherosclerotic plaque in the abdominal aorta. No signs of aneurysm or acute abdominal vascular abnormality. The portal vein is patent. The splenic vein is patent. No signs of adenopathy in the upper abdomen or retroperitoneum. No signs of pelvic lymphadenopathy. Reproductive: Nonspecific appearance of the prostate by CT. Other: Perihepatic and upper abdominal fluid. Fluid along the left hepatic margin with scalloping of the left hemi liver measures 8 x 8 cm. Fluid in the left subdiaphragmatic area adjacent the spleen measures 14 by 4 cm. Right flank fluid 6.8 by 2.8 cm. Smaller pockets of fluid along the anterior surface of the liver. Diffuse omental stranding or nodularity. Interloop fluid adjacent to jejunal loops in the left abdomen 5.1 x 2.2 cm. No signs of free air. Musculoskeletal: No signs of acute bone finding or evidence of destructive bone process. IMPRESSION: 1. Interval development of extensive perihepatic fluid with scalloping of the hepatic margin, associated with diffuse ascites, some areas with more loculated appearance and with interloop fluid in the left hemiabdomen. Also associated with irregular omental and peritoneal stranding along with bowel edema. Constellation of findings could be seen in the setting of peritonitis, rapid development favors this alternative. Secondarily malignant ascites could also have this appearance. Diagnostic paracentesis could be helpful. 2. Marked irregular gallbladder wall thickening, gallbladder is less distended than on the previous exam. Gallbladder compromise cannot be  proven on the basis of this study though biliary peritonitis is certainly in the differential based on the appearance of the abdominal fluid. Acute on chronic cholecystitis with gallbladder compromise is considered. Based on gallbladder wall thickening neoplasm remains in the differential. 3. No signs of extraluminal contrast or other findings that would support bowel leak or perforated ulcer as a cause for diffuse peritonitis. 4. No signs of pulmonary embolism. 5. Dense material in collapsed lung at the lung bases likely iodinated contrast, potentially aspirated by this patient with basilar airspace disease and small effusions. Aortic Atherosclerosis (ICD10-I70.0). Electronically Signed   By: Zetta Bills M.D.   On: 09/03/2019 18:05   Mr 3d Recon At Scanner  Result Date: 09/02/2019 CLINICAL DATA:  Patient with possible pancreatitis. Gallbladder wall thickening on prior right upper quadrant ultrasound. EXAM: MRI ABDOMEN WITHOUT AND WITH CONTRAST (INCLUDING MRCP) TECHNIQUE: Multiplanar multisequence MR imaging of the abdomen was performed both before and after the administration of intravenous contrast. Heavily T2-weighted images of the biliary and pancreatic ducts were obtained, and three-dimensional MRCP images were rendered by post processing. CONTRAST:  4mL GADAVIST GADOBUTROL 1 MMOL/ML IV SOLN COMPARISON:  Ultrasound abdomen 09/01/2009; CT abdomen pelvis 08/04/2019 FINDINGS: Lower chest: Small bilateral pleural effusions. Consolidative opacities within the lower lungs bilaterally. Hepatobiliary: Liver is normal in size and contour. There is irregular wall thickening of the gallbladder. No cholelithiasis. Possible filling defect within the distal common bile duct on MRCP images (image 30; series 7). No intrahepatic or extrahepatic biliary ductal dilatation. Pancreas:  Unremarkable Spleen:  Unremarkable Adrenals/Urinary Tract: Normal adrenal glands. Kidneys enhance symmetrically with contrast. No  hydronephrosis. Stomach/Bowel: Moderate sized hiatal hernia. Otherwise normal morphology of the stomach. No evidence for small bowel obstruction. Vascular/Lymphatic: Normal caliber abdominal aorta. No retroperitoneal lymphadenopathy. Other: There is ascites within the left upper quadrant. Additionally there is perihepatic fluid which appears loculated along the anterior hepatic margin (image 13; series 5). There is patchy enhancement scattered throughout the omentum and colonic mesentery within the central upper abdomen (image 57; series 1202). Musculoskeletal: No aggressive or acute appearing osseous lesions. IMPRESSION: 1. Persistent irregular wall thickening of the gallbladder which is nonspecific in etiology. Findings may be secondary to acute cholecystitis in the appropriate clinical setting. The possibility of gallbladder malignancy is not excluded. 2. There appears to be loculated ascites within the left upper quadrant and within the perihepatic locations, nonspecific in etiology. Additionally, there is patchy enhancement involving the omentum and upper abdominal colonic mesentery. While these findings may be secondary to an acute infectious/inflammatory process, the possibility of omental carcinomatosis and/or malignant ascites is not entirely excluded, potentially if the gallbladder findings are secondary to a malignancy. 3. Given the multitude of findings, many of which have changed or progressed from prior CT, recommend further evaluation with contrast enhanced CT of the abdomen and pelvis. 4. There is narrowing of the distal aspect of the common bile duct on MRCP images which is likely artifactual in etiology. The possibility of a small distal CBD stone is not entirely excluded. Electronically Signed   By: Lovey Newcomer M.D.   On: 09/02/2019 20:58   Dg Chest Port 1 View  Result Date: 09/03/2019 CLINICAL DATA:  Hypoxia. EXAM: PORTABLE CHEST 1 VIEW COMPARISON:  One-view chest x-ray 08/08/2019 FINDINGS:  Heart size is exaggerated by low lung volumes. Moderate pulmonary vascular congestion is present. Bilateral pleural effusions and basilar airspace opacities are noted. The visualized soft tissues and bony thorax are unremarkable. IMPRESSION: 1. Stable appearance of  bilateral pleural effusions and basilar airspace disease. While this likely reflects atelectasis, infection is not excluded. 2. Moderate pulmonary vascular congestion. Electronically Signed   By: Marin Roberts M.D.   On: 09/03/2019 05:01   Mr Abdomen Mrcp Vivien Rossetti Contast  Result Date: 09/02/2019 CLINICAL DATA:  Patient with possible pancreatitis. Gallbladder wall thickening on prior right upper quadrant ultrasound. EXAM: MRI ABDOMEN WITHOUT AND WITH CONTRAST (INCLUDING MRCP) TECHNIQUE: Multiplanar multisequence MR imaging of the abdomen was performed both before and after the administration of intravenous contrast. Heavily T2-weighted images of the biliary and pancreatic ducts were obtained, and three-dimensional MRCP images were rendered by post processing. CONTRAST:  9mL GADAVIST GADOBUTROL 1 MMOL/ML IV SOLN COMPARISON:  Ultrasound abdomen 09/01/2009; CT abdomen pelvis 08/04/2019 FINDINGS: Lower chest: Small bilateral pleural effusions. Consolidative opacities within the lower lungs bilaterally. Hepatobiliary: Liver is normal in size and contour. There is irregular wall thickening of the gallbladder. No cholelithiasis. Possible filling defect within the distal common bile duct on MRCP images (image 30; series 7). No intrahepatic or extrahepatic biliary ductal dilatation. Pancreas:  Unremarkable Spleen:  Unremarkable Adrenals/Urinary Tract: Normal adrenal glands. Kidneys enhance symmetrically with contrast. No hydronephrosis. Stomach/Bowel: Moderate sized hiatal hernia. Otherwise normal morphology of the stomach. No evidence for small bowel obstruction. Vascular/Lymphatic: Normal caliber abdominal aorta. No retroperitoneal lymphadenopathy.  Other: There is ascites within the left upper quadrant. Additionally there is perihepatic fluid which appears loculated along the anterior hepatic margin (image 13; series 5). There is patchy enhancement scattered throughout the omentum and colonic mesentery within the central upper abdomen (image 57; series 1202). Musculoskeletal: No aggressive or acute appearing osseous lesions. IMPRESSION: 1. Persistent irregular wall thickening of the gallbladder which is nonspecific in etiology. Findings may be secondary to acute cholecystitis in the appropriate clinical setting. The possibility of gallbladder malignancy is not excluded. 2. There appears to be loculated ascites within the left upper quadrant and within the perihepatic locations, nonspecific in etiology. Additionally, there is patchy enhancement involving the omentum and upper abdominal colonic mesentery. While these findings may be secondary to an acute infectious/inflammatory process, the possibility of omental carcinomatosis and/or malignant ascites is not entirely excluded, potentially if the gallbladder findings are secondary to a malignancy. 3. Given the multitude of findings, many of which have changed or progressed from prior CT, recommend further evaluation with contrast enhanced CT of the abdomen and pelvis. 4. There is narrowing of the distal aspect of the common bile duct on MRCP images which is likely artifactual in etiology. The possibility of a small distal CBD stone is not entirely excluded. Electronically Signed   By: Annia Belt M.D.   On: 09/02/2019 20:58   US Abdomen Limited Ruq  Result Date: 09/02/2019 CLINICAL DATA:  63 year old male with history of nausea, vomiting and abdominal pain. Under in both to take deep breaths. EXAM: ULTRASOUND ABDOMEN LIMITED RIGHT UPPER QUADRANT COMPARISON:  None. FINDINGS: Gallbladder: No gallstones. Gallbladder wall appears mildly thickened ranging from 5-7 mm in thickness. No pericholecystic fluid. No  sonographic Murphy sign noted by sonographer. Common bile duct: Diameter: 3 mm Liver: No focal lesion identified. Within normal limits in parenchymal echogenicity. Portal vein is patent on color Doppler imaging with normal direction of blood flow towards the liver. Other: None. IMPRESSION: 1. Gallbladder wall appears mildly thickened. This is of uncertain etiology and significance. No gallstones or findings to suggest an acute cholecystitis are noted at this time. Follow-up right upper quadrant abdominal ultrasound is suggested in 2 weeks to  ensure resolution of this finding. Should this finding not resolve, further evaluation with abdominal MRI with and without IV gadolinium with MRCP would be suggested to exclude the possibility of gallbladder malignancy. Electronically Signed   By: Trudie Reed M.D.   On: 09/02/2019 13:13    Anti-infectives: Anti-infectives (From admission, onward)   Start     Dose/Rate Route Frequency Ordered Stop   09/04/19 0000  piperacillin-tazobactam (ZOSYN) IVPB 3.375 g     3.375 g 12.5 mL/hr over 240 Minutes Intravenous Every 8 hours 09/03/19 1828     09/03/19 1830  piperacillin-tazobactam (ZOSYN) IVPB 3.375 g     3.375 g 100 mL/hr over 30 Minutes Intravenous STAT 09/03/19 1828 09/03/19 1916       Assessment/Plan H/o heavy ETOH abuse with recent DTs - states he has abstained since recent admission HTN   Abdominal pain with irregular appearing gallbladder   CT scan completed yesterday with diffuse ascites, but greatest in upper abdomen with mesenteric and omental stranding.  Still with somewhat of an irregular appearing gallbladder wall, but inconclusive to whether this could be cholecystitis or not.  Certainly cholecystitis does not generally have this type of diffuse abnormalities on CT scan in the setting of a negative work up 3-4 weeks ago.  CT scan reviewed with radiology this morning and also felt unlikely to be malignancy given rapid progression of findings  on his new scan.  His AFP and CA19-9 are both normal.  After further discussion with Dr. Ezzard Standing and radiology, we are going to order a repeat HIDA to once again evaluate the gallbladder as well as a paracentesis with labs to help determine origin of problem.  Ultimately, though the patient will likely require at least a diagnostic laparoscopy at some point this week pending now other results return that would prohibit this.     Wife was not present on my first encounter with the patient today, but I walked back by while I was still on the floor and discussed the case with Dr. Dulce Sellar who was in the room and now his wife.  I discussed all diagnostic findings with the wife and explained what new imaging we were getting and why we were getting it along with the paracentesis.  We also discussed the high likelihood of operative intervention at some point this week.  She and the patient understand and are agreeable to proceed with our current work up plan.  FEN - may have some liquids after HIDA scan VTE - ok for chemical prophylaxis from our [facility] ID - none currently   LOS: 1 day    Letha Cape , [facility] 09/04/2019, 11:17 AM Please see Amion for pager number during day hours 7:00am-4:30pm  Agree with above. His diagnosis is very unclear.  His repeat HIDA scan is normal, which argues against the gall bladder as the source of his problems. Unfortunately, they could not get to his paracentesis today. I have discussed the case with Dr. Dulce Sellar and Lajuana Ripple.  I have discussed with the patient and wife about proceeding with laparoscopic exploration tomorrow, 12/2.  The problem that I emphasized to them is that I am unsure what I will find.  I can aspirate the ascites for cytology and cultures.  And if there is any evidence of malignancy, I can biopsy tissue.  Regarding the gall bladder, there is no surgical reason to take it out, but it has been a source of questions. The risks  of surgery include  bleeding, infection, bowel resection, ostomy, and not finding a clear diagnosis.  Will give diet until midnight tonight, then NPO.  And plan laparoscopic exploration in the AM.  Ovidio Kinavid Caitriona Sundquist, MD, [facility] Office phone:  [phone]

## 2019-09-04 NOTE — Progress Notes (Signed)
Pharmacy Antibiotic Note  Corey Logan is a 63 y.o. male admitted on 09/02/2019 with abdominal pain.  Pharmacy has been consulted for piperacillin/tazobactam dosing for intra-abdominal infection.  Today, 09/04/19  WBC WNL  SCr 1.6, CrCl ~50 mL/min  Afebrile  Plan:  Continue piperacillin/tazobactam 3.375 g IV q8h EI.  Pharmacy to sign off, no renal dose adjustment needed at this time.    Height: 5\' 10"  (177.8 cm) Weight: 180 lb 8.9 oz (81.9 kg) IBW/kg (Calculated) : 73  Temp (24hrs), Avg:98.7 F (37.1 C), Min:98.1 F (36.7 C), Max:99.4 F (37.4 C)  Recent Labs  Lab 09/02/19 1127 09/02/19 1601 09/03/19 0019 09/03/19 0304 09/03/19 1220 09/04/19 0127  WBC 16.2*  --   --  9.8  --  7.4  CREATININE 1.45*  --   --   --  1.68* 1.58*  LATICACIDVEN  --  1.2 1.7  --   --   --     Estimated Creatinine Clearance: 50.1 mL/min (A) (by C-G formula based on SCr of 1.58 mg/dL (H)).    No Known Allergies  Antimicrobials this admission: 11/30 Zosyn >>  Dose adjustments this admission:  Microbiology results: 11/30 COVID: negative   Thank you for allowing pharmacy to be a part of this patient's care.  Lenis Noon, PharmD 09/04/2019 11:08 AM

## 2019-09-04 NOTE — Progress Notes (Addendum)
   09/04/19 0049  Urine Characteristics  Urine Color Tea colored  Urine Appearance Cloudy    RN notified on call physician of the above information.   No orders received. RN will continue to monitor.

## 2019-09-04 NOTE — Plan of Care (Signed)
  Problem: Education: Goal: Knowledge of General Education information will improve Description Including pain rating scale, medication(s)/side effects and non-pharmacologic comfort measures Outcome: Progressing   Problem: Health Behavior/Discharge Planning: Goal: Ability to manage health-related needs will improve Outcome: Progressing   

## 2019-09-04 NOTE — Progress Notes (Addendum)
PROGRESS NOTE    Corey Logan  RAQ:[id number]  DOB: 27-Mar-1956  DOA: 09/02/2019 PCP: Elias Else, MD  Brief Narrative:  63 y.o.malewith medical history significant of arthritis, depression, GERD, hiatal hernia, hypertensionalcohol dependence presented to ED on 11/29 with nausea, vomiting, right upper quadrant pain after he ate eggs and steak radiating to his right and left shoulder.States that he quit alcohol and have not had any for the past 1 month. Patient was recently admitted in October with RUQ pain- work-up with CT scan showed gallbladder thickening, USG showed no evidence of gallstones or significant GB wall/ CBD abnormality, HIDA scan showed normal biliary patency and normal ejection fraction. GS evaluated patient and felt that his symptoms were not related to his gallbladder disease-recommended GI eval for EGD. GI series showed small sliding hiatal hernia and GERD, mild esophageal dysmotility.While hospitalized patient developed acute alcohol withdrawal syndrome /encephalopathy with w/u including EEG/CT head  Hospital course: Patient admitted to [facility] with medical supportive management and GS/GI re-consulted. MRCP reports distal CBD stone vs artifact, GB wall Irregularity,loculated ascites within the left upper quadrant and within the perihepatic locations, nonspecific in etiology. Additionally, patchy enhancement involving the omentum and upper abdominal colonic mesentery-concerning for malignancy-Repeat CT A/P per GS along with CA 19-9, AFP, CA 125 sent. Started on CLD. HC complicated by AKI requiring IVF and acute hypoxemic respiratory failure-Noted effusions/atelectasis with pulmonary vasc congestion-COVID negative-D dimer 3.71, CT chest -ve for PE  Subjective: Patient back from HIDA scan.  Wife at bedside.  Anxious about care plan.  Objective: Vitals:   09/04/19 0054 09/04/19 0410 09/04/19 0615 09/04/19 0701  BP: 133/90 (!) 151/93  (!) 151/87  Pulse: 100 93 97 97  Resp: 16  20    Temp: 98.1 F (36.7 C) 99.4 F (37.4 C)    TempSrc: Oral Oral    SpO2: 97% 97%    Weight:      Height:        Intake/Output Summary (Last 24 hours) at 09/04/2019 0932 Last data filed at 09/04/2019 0600 Gross per 24 hour  Intake 2869.64 ml  Output 455 ml  Net 2414.64 ml   Filed Weights   09/03/19 0125  Weight: 81.9 kg    Physical Examination:  General exam: Appears calm and comfortable  Respiratory system: Clear to auscultation. Respiratory effort normal. Cardiovascular system: S1 & S2 heard, RRR. No JVD, murmurs, rubs, gallops or clicks. No pedal edema. Gastrointestinal system: Abdomen is nondistended, right upper quadrant tenderness without rebound. No organomegaly or masses felt. Normal bowel sounds heard. Central nervous system: Alert and oriented. No new focal neurological deficits. Extremities: No contractures, edema or joint deformities.  Skin: No rashes, lesions or ulcers Psychiatry: Judgement and insight appear normal. Mood & affect appropriate.   Data Reviewed: I have personally reviewed following labs and imaging studies  CBC: Recent Labs  Lab 09/02/19 1127 09/03/19 0304 09/04/19 0127  WBC 16.2* 9.8 7.4  HGB 18.0* 16.4 14.4  HCT 53.1* 49.6 44.9  MCV 91.6 92.5 94.7  PLT 514* 406* 357   Basic Metabolic Panel: Recent Labs  Lab 09/02/19 1127 09/03/19 0304 09/03/19 1220 09/04/19 0127  NA 136  --  137 134*  K 4.1  --  4.6 4.7  CL 102  --  108 104  CO2 21*  --  20* 20*  GLUCOSE 209*  --  154* 130*  BUN 29*  --  47* 47*  CREATININE 1.45*  --  1.68* 1.58*  CALCIUM 9.4  --  8.2* 8.1*  MG  --  2.2  --  2.4  PHOS  --  3.4  --   --    GFR: Estimated Creatinine Clearance: 50.1 mL/min (A) (by C-G formula based on SCr of 1.58 mg/dL (H)). Liver Function Tests: Recent Labs  Lab 09/02/19 1127 09/04/19 0127  AST 17 18  ALT 17 8  ALKPHOS 43 38  BILITOT 1.3* 2.0*  PROT 7.5 5.6*  ALBUMIN 4.0 2.7*   Recent Labs  Lab 09/02/19 1127  LIPASE 55*    No results for input(s): AMMONIA in the last 168 hours. Coagulation Profile: No results for input(s): INR, PROTIME in the last 168 hours. Cardiac Enzymes: No results for input(s): CKTOTAL, CKMB, CKMBINDEX, TROPONINI in the last 168 hours. BNP (last 3 results) No results for input(s): PROBNP in the last 8760 hours. HbA1C: Recent Labs    09/03/19 0304  HGBA1C 5.6   CBG: Recent Labs  Lab 09/03/19 1737 09/03/19 2058 09/04/19 0042 09/04/19 0408 09/04/19 0742  GLUCAP 169* 124* 123* 124* 112*   Lipid Profile: No results for input(s): CHOL, HDL, LDLCALC, TRIG, CHOLHDL, LDLDIRECT in the last 72 hours. Thyroid Function Tests: Recent Labs    09/03/19 0304  TSH 3.900   Anemia Panel: Recent Labs    09/02/19 1127  FERRITIN 318   Sepsis Labs: Recent Labs  Lab 09/02/19 1601 09/03/19 0019  LATICACIDVEN 1.2 1.7    Recent Results (from the past 240 hour(s))  SARS CORONAVIRUS 2 (TAT 6-24 HRS) Nasopharyngeal Nasopharyngeal Swab     Status: None   Collection Time: 09/03/19 12:19 AM   Specimen: Nasopharyngeal Swab  Result Value Ref Range Status   SARS Coronavirus 2 NEGATIVE NEGATIVE Final    Comment: (NOTE) SARS-CoV-2 target nucleic acids are NOT DETECTED. The SARS-CoV-2 RNA is generally detectable in upper and lower respiratory specimens during the acute phase of infection. Negative results do not preclude SARS-CoV-2 infection, do not rule out co-infections with other pathogens, and should not be used as the sole basis for treatment or other patient management decisions. Negative results must be combined with clinical observations, patient history, and epidemiological information. The expected result is Negative. Fact Sheet for Patients: HairSlick.no Fact Sheet for Healthcare Providers: quierodirigir.com This test is not yet approved or cleared by the [place] FDA and  has been authorized for detection and/or  diagnosis of SARS-CoV-2 by FDA under an Emergency Use Authorization (EUA). This EUA will remain  in effect (meaning this test can be used) for the duration of the COVID-19 declaration under Section 56 4(b)(1) of the Act, 21 U.S.C. section 360bbb-3(b)(1), unless the authorization is terminated or revoked sooner. Performed at [facility], [address] [place], [place] [address]       Radiology Studies: Ct Angio Chest Pe W Or Wo Contrast  Result Date: 09/03/2019 CLINICAL DATA:  Abdominal pain, MRI suggesting omental caking. EXAM: CT CHEST CTA protocol of the chest and CT of the ABDOMEN, AND PELVIS WITH CONTRAST TECHNIQUE: Multidetector CT imaging of the chest utilizing CTA, PE protocol, and abdomen and pelvis was performed following the standard protocol during bolus administration of intravenous contrast. Maximum intensity projection reconstructions were generated, submitted and were reviewed. CONTRAST:  80mL OMNIPAQUE IOHEXOL 350 MG/ML SOLN COMPARISON:  MRI 09/02/2019 prior CT assessments dating back to October of 2020 FINDINGS: CT CHEST FINDINGS Cardiovascular: Scattered aortic atherosclerosis. Mild dilation of ascending thoracic aorta 3.9 cm. Heart size is mildly enlarged without pericardial fluid. No signs of pulmonary  embolism. Mediastinum/Nodes: No signs of mediastinal lymphadenopathy. Small amount of loculated fluid adjacent to a small hiatal hernia. Fluid is contiguous with hepatic gastric in left subdiaphragmatic fluid. No signs of adenopathy in the chest. Lungs/Pleura: Basilar collapse/consolidation bilaterally. Small bilateral pleural effusions. Airways are patent. Musculoskeletal: No signs of chest wall mass. No signs of acute bone finding or evidence of destructive bone process. No signs of chest wall mass. CT ABDOMEN PELVIS FINDINGS Hepatobiliary: Interval development of extensive perihepatic fluid with scalloping of the a Paddock margin. Fluid extends into the hepatic gastric  recess, left subdiaphragmatic space along the right hemi liver and in the right pericolic gutter. Also tracking into the pelvis density between 15 and 20 Hounsfield units, nonspecific. Diffuse irregular gallbladder wall thickening. Gallbladder is less distended than on the previous exam. No focal mass lesion in the gallbladder. Pancreas: No signs of acute interstitial pancreatitis, ductal dilation or lesion. Spleen: Normal in size without focal abnormality. Adrenals/Urinary Tract: Normal adrenal glands. Symmetric enhancement of the bilateral kidneys. Stomach/Bowel: Small hiatal hernia. Signs of interloop fluid along the jejunum. Developing bowel distension since the MRI of 09/02/2019. Gradual transition in the mid abdomen but with decompressed distal small bowel loops. No signs of pneumatosis. Diffuse mesenteric edema. Vascular/Lymphatic: Calcified and noncalcified atherosclerotic plaque in the abdominal aorta. No signs of aneurysm or acute abdominal vascular abnormality. The portal vein is patent. The splenic vein is patent. No signs of adenopathy in the upper abdomen or retroperitoneum. No signs of pelvic lymphadenopathy. Reproductive: Nonspecific appearance of the prostate by CT. Other: Perihepatic and upper abdominal fluid. Fluid along the left hepatic margin with scalloping of the left hemi liver measures 8 x 8 cm. Fluid in the left subdiaphragmatic area adjacent the spleen measures 14 by 4 cm. Right flank fluid 6.8 by 2.8 cm. Smaller pockets of fluid along the anterior surface of the liver. Diffuse omental stranding or nodularity. Interloop fluid adjacent to jejunal loops in the left abdomen 5.1 x 2.2 cm. No signs of free air. Musculoskeletal: No signs of acute bone finding or evidence of destructive bone process. IMPRESSION: 1. Interval development of extensive perihepatic fluid with scalloping of the hepatic margin, associated with diffuse ascites, some areas with more loculated appearance and with interloop  fluid in the left hemiabdomen. Also associated with irregular omental and peritoneal stranding along with bowel edema. Constellation of findings could be seen in the setting of peritonitis, rapid development favors this alternative. Secondarily malignant ascites could also have this appearance. Diagnostic paracentesis could be helpful. 2. Marked irregular gallbladder wall thickening, gallbladder is less distended than on the previous exam. Gallbladder compromise cannot be proven on the basis of this study though biliary peritonitis is certainly in the differential based on the appearance of the abdominal fluid. Acute on chronic cholecystitis with gallbladder compromise is considered. Based on gallbladder wall thickening neoplasm remains in the differential. 3. No signs of extraluminal contrast or other findings that would support bowel leak or perforated ulcer as a cause for diffuse peritonitis. 4. No signs of pulmonary embolism. 5. Dense material in collapsed lung at the lung bases likely iodinated contrast, potentially aspirated by this patient with basilar airspace disease and small effusions. Aortic Atherosclerosis (ICD10-I70.0). Electronically Signed   By: Donzetta Kohut M.D.   On: 09/03/2019 18:05   Nm Pulmonary Perfusion  Result Date: 09/03/2019 CLINICAL DATA:  Inpatient.  Elevated D-dimer.  Abdominal pain. EXAM: NUCLEAR MEDICINE PERFUSION LUNG SCAN TECHNIQUE: Perfusion images were obtained in multiple projections after intravenous injection  of radiopharmaceutical. Ventilation scans were attempted but not tolerated by the patient (patient vomited after breathing apparatus placed). RADIOPHARMACEUTICALS:  1.5 mCi Tc-2431m MAA IV COMPARISON:  Chest radiograph from earlier today. FINDINGS: There are multiple small moderate perfusion defects at posterior lung bases bilaterally, correlating with areas of airspace disease on the chest radiograph from earlier today. IMPRESSION: Perfusion-only scan, see comments.  Multiple small to moderate perfusion defects at the posterior lung bases bilaterally with chest radiograph correlates. Findings are considered intermediate probability for acute pulmonary embolism. Electronically Signed   By: Delbert PhenixJason A Poff M.D.   On: 09/03/2019 16:52   Ct Abdomen Pelvis W Contrast  Result Date: 09/03/2019 CLINICAL DATA:  Abdominal pain, MRI suggesting omental caking. EXAM: CT CHEST CTA protocol of the chest and CT of the ABDOMEN, AND PELVIS WITH CONTRAST TECHNIQUE: Multidetector CT imaging of the chest utilizing CTA, PE protocol, and abdomen and pelvis was performed following the standard protocol during bolus administration of intravenous contrast. Maximum intensity projection reconstructions were generated, submitted and were reviewed. CONTRAST:  80mL OMNIPAQUE IOHEXOL 350 MG/ML SOLN COMPARISON:  MRI 09/02/2019 prior CT assessments dating back to October of 2020 FINDINGS: CT CHEST FINDINGS Cardiovascular: Scattered aortic atherosclerosis. Mild dilation of ascending thoracic aorta 3.9 cm. Heart size is mildly enlarged without pericardial fluid. No signs of pulmonary embolism. Mediastinum/Nodes: No signs of mediastinal lymphadenopathy. Small amount of loculated fluid adjacent to a small hiatal hernia. Fluid is contiguous with hepatic gastric in left subdiaphragmatic fluid. No signs of adenopathy in the chest. Lungs/Pleura: Basilar collapse/consolidation bilaterally. Small bilateral pleural effusions. Airways are patent. Musculoskeletal: No signs of chest wall mass. No signs of acute bone finding or evidence of destructive bone process. No signs of chest wall mass. CT ABDOMEN PELVIS FINDINGS Hepatobiliary: Interval development of extensive perihepatic fluid with scalloping of the a Paddock margin. Fluid extends into the hepatic gastric recess, left subdiaphragmatic space along the right hemi liver and in the right pericolic gutter. Also tracking into the pelvis density between 15 and 20  Hounsfield units, nonspecific. Diffuse irregular gallbladder wall thickening. Gallbladder is less distended than on the previous exam. No focal mass lesion in the gallbladder. Pancreas: No signs of acute interstitial pancreatitis, ductal dilation or lesion. Spleen: Normal in size without focal abnormality. Adrenals/Urinary Tract: Normal adrenal glands. Symmetric enhancement of the bilateral kidneys. Stomach/Bowel: Small hiatal hernia. Signs of interloop fluid along the jejunum. Developing bowel distension since the MRI of 09/02/2019. Gradual transition in the mid abdomen but with decompressed distal small bowel loops. No signs of pneumatosis. Diffuse mesenteric edema. Vascular/Lymphatic: Calcified and noncalcified atherosclerotic plaque in the abdominal aorta. No signs of aneurysm or acute abdominal vascular abnormality. The portal vein is patent. The splenic vein is patent. No signs of adenopathy in the upper abdomen or retroperitoneum. No signs of pelvic lymphadenopathy. Reproductive: Nonspecific appearance of the prostate by CT. Other: Perihepatic and upper abdominal fluid. Fluid along the left hepatic margin with scalloping of the left hemi liver measures 8 x 8 cm. Fluid in the left subdiaphragmatic area adjacent the spleen measures 14 by 4 cm. Right flank fluid 6.8 by 2.8 cm. Smaller pockets of fluid along the anterior surface of the liver. Diffuse omental stranding or nodularity. Interloop fluid adjacent to jejunal loops in the left abdomen 5.1 x 2.2 cm. No signs of free air. Musculoskeletal: No signs of acute bone finding or evidence of destructive bone process. IMPRESSION: 1. Interval development of extensive perihepatic fluid with scalloping of the hepatic margin, associated with  diffuse ascites, some areas with more loculated appearance and with interloop fluid in the left hemiabdomen. Also associated with irregular omental and peritoneal stranding along with bowel edema. Constellation of findings could be  seen in the setting of peritonitis, rapid development favors this alternative. Secondarily malignant ascites could also have this appearance. Diagnostic paracentesis could be helpful. 2. Marked irregular gallbladder wall thickening, gallbladder is less distended than on the previous exam. Gallbladder compromise cannot be proven on the basis of this study though biliary peritonitis is certainly in the differential based on the appearance of the abdominal fluid. Acute on chronic cholecystitis with gallbladder compromise is considered. Based on gallbladder wall thickening neoplasm remains in the differential. 3. No signs of extraluminal contrast or other findings that would support bowel leak or perforated ulcer as a cause for diffuse peritonitis. 4. No signs of pulmonary embolism. 5. Dense material in collapsed lung at the lung bases likely iodinated contrast, potentially aspirated by this patient with basilar airspace disease and small effusions. Aortic Atherosclerosis (ICD10-I70.0). Electronically Signed   By: Donzetta Kohut M.D.   On: 09/03/2019 18:05   Mr 3d Recon At Scanner  Result Date: 09/02/2019 CLINICAL DATA:  Patient with possible pancreatitis. Gallbladder wall thickening on prior right upper quadrant ultrasound. EXAM: MRI ABDOMEN WITHOUT AND WITH CONTRAST (INCLUDING MRCP) TECHNIQUE: Multiplanar multisequence MR imaging of the abdomen was performed both before and after the administration of intravenous contrast. Heavily T2-weighted images of the biliary and pancreatic ducts were obtained, and three-dimensional MRCP images were rendered by post processing. CONTRAST:  9mL GADAVIST GADOBUTROL 1 MMOL/ML IV SOLN COMPARISON:  Ultrasound abdomen 09/01/2009; CT abdomen pelvis 08/04/2019 FINDINGS: Lower chest: Small bilateral pleural effusions. Consolidative opacities within the lower lungs bilaterally. Hepatobiliary: Liver is normal in size and contour. There is irregular wall thickening of the gallbladder. No  cholelithiasis. Possible filling defect within the distal common bile duct on MRCP images (image 30; series 7). No intrahepatic or extrahepatic biliary ductal dilatation. Pancreas:  Unremarkable Spleen:  Unremarkable Adrenals/Urinary Tract: Normal adrenal glands. Kidneys enhance symmetrically with contrast. No hydronephrosis. Stomach/Bowel: Moderate sized hiatal hernia. Otherwise normal morphology of the stomach. No evidence for small bowel obstruction. Vascular/Lymphatic: Normal caliber abdominal aorta. No retroperitoneal lymphadenopathy. Other: There is ascites within the left upper quadrant. Additionally there is perihepatic fluid which appears loculated along the anterior hepatic margin (image 13; series 5). There is patchy enhancement scattered throughout the omentum and colonic mesentery within the central upper abdomen (image 57; series 1202). Musculoskeletal: No aggressive or acute appearing osseous lesions. IMPRESSION: 1. Persistent irregular wall thickening of the gallbladder which is nonspecific in etiology. Findings may be secondary to acute cholecystitis in the appropriate clinical setting. The possibility of gallbladder malignancy is not excluded. 2. There appears to be loculated ascites within the left upper quadrant and within the perihepatic locations, nonspecific in etiology. Additionally, there is patchy enhancement involving the omentum and upper abdominal colonic mesentery. While these findings may be secondary to an acute infectious/inflammatory process, the possibility of omental carcinomatosis and/or malignant ascites is not entirely excluded, potentially if the gallbladder findings are secondary to a malignancy. 3. Given the multitude of findings, many of which have changed or progressed from prior CT, recommend further evaluation with contrast enhanced CT of the abdomen and pelvis. 4. There is narrowing of the distal aspect of the common bile duct on MRCP images which is likely artifactual  in etiology. The possibility of a small distal CBD stone is not entirely excluded. Electronically  Signed   By: Annia Belt M.D.   On: 09/02/2019 20:58   Dg Chest Port 1 View  Result Date: 09/03/2019 CLINICAL DATA:  Hypoxia. EXAM: PORTABLE CHEST 1 VIEW COMPARISON:  One-view chest x-ray 08/08/2019 FINDINGS: Heart size is exaggerated by low lung volumes. Moderate pulmonary vascular congestion is present. Bilateral pleural effusions and basilar airspace opacities are noted. The visualized soft tissues and bony thorax are unremarkable. IMPRESSION: 1. Stable appearance of bilateral pleural effusions and basilar airspace disease. While this likely reflects atelectasis, infection is not excluded. 2. Moderate pulmonary vascular congestion. Electronically Signed   By: Marin Roberts M.D.   On: 09/03/2019 05:01   Mr Abdomen Mrcp Vivien Rossetti Contast  Result Date: 09/02/2019 CLINICAL DATA:  Patient with possible pancreatitis. Gallbladder wall thickening on prior right upper quadrant ultrasound. EXAM: MRI ABDOMEN WITHOUT AND WITH CONTRAST (INCLUDING MRCP) TECHNIQUE: Multiplanar multisequence MR imaging of the abdomen was performed both before and after the administration of intravenous contrast. Heavily T2-weighted images of the biliary and pancreatic ducts were obtained, and three-dimensional MRCP images were rendered by post processing. CONTRAST:  9mL GADAVIST GADOBUTROL 1 MMOL/ML IV SOLN COMPARISON:  Ultrasound abdomen 09/01/2009; CT abdomen pelvis 08/04/2019 FINDINGS: Lower chest: Small bilateral pleural effusions. Consolidative opacities within the lower lungs bilaterally. Hepatobiliary: Liver is normal in size and contour. There is irregular wall thickening of the gallbladder. No cholelithiasis. Possible filling defect within the distal common bile duct on MRCP images (image 30; series 7). No intrahepatic or extrahepatic biliary ductal dilatation. Pancreas:  Unremarkable Spleen:  Unremarkable Adrenals/Urinary  Tract: Normal adrenal glands. Kidneys enhance symmetrically with contrast. No hydronephrosis. Stomach/Bowel: Moderate sized hiatal hernia. Otherwise normal morphology of the stomach. No evidence for small bowel obstruction. Vascular/Lymphatic: Normal caliber abdominal aorta. No retroperitoneal lymphadenopathy. Other: There is ascites within the left upper quadrant. Additionally there is perihepatic fluid which appears loculated along the anterior hepatic margin (image 13; series 5). There is patchy enhancement scattered throughout the omentum and colonic mesentery within the central upper abdomen (image 57; series 1202). Musculoskeletal: No aggressive or acute appearing osseous lesions. IMPRESSION: 1. Persistent irregular wall thickening of the gallbladder which is nonspecific in etiology. Findings may be secondary to acute cholecystitis in the appropriate clinical setting. The possibility of gallbladder malignancy is not excluded. 2. There appears to be loculated ascites within the left upper quadrant and within the perihepatic locations, nonspecific in etiology. Additionally, there is patchy enhancement involving the omentum and upper abdominal colonic mesentery. While these findings may be secondary to an acute infectious/inflammatory process, the possibility of omental carcinomatosis and/or malignant ascites is not entirely excluded, potentially if the gallbladder findings are secondary to a malignancy. 3. Given the multitude of findings, many of which have changed or progressed from prior CT, recommend further evaluation with contrast enhanced CT of the abdomen and pelvis. 4. There is narrowing of the distal aspect of the common bile duct on MRCP images which is likely artifactual in etiology. The possibility of a small distal CBD stone is not entirely excluded. Electronically Signed   By: Annia Belt M.D.   On: 09/02/2019 20:58   US Abdomen Limited Ruq  Result Date: 09/02/2019 CLINICAL DATA:  63 year old  male with history of nausea, vomiting and abdominal pain. Under in both to take deep breaths. EXAM: ULTRASOUND ABDOMEN LIMITED RIGHT UPPER QUADRANT COMPARISON:  None. FINDINGS: Gallbladder: No gallstones. Gallbladder wall appears mildly thickened ranging from 5-7 mm in thickness. No pericholecystic fluid. No sonographic Murphy sign noted by  sonographer. Common bile duct: Diameter: 3 mm Liver: No focal lesion identified. Within normal limits in parenchymal echogenicity. Portal vein is patent on color Doppler imaging with normal direction of blood flow towards the liver. Other: None. IMPRESSION: 1. Gallbladder wall appears mildly thickened. This is of uncertain etiology and significance. No gallstones or findings to suggest an acute cholecystitis are noted at this time. Follow-up right upper quadrant abdominal ultrasound is suggested in 2 weeks to ensure resolution of this finding. Should this finding not resolve, further evaluation with abdominal MRI with and without IV gadolinium with MRCP would be suggested to exclude the possibility of gallbladder malignancy. Electronically Signed   By: Trudie Reed M.D.   On: 09/02/2019 13:13        Scheduled Meds:  folic acid  1 mg Oral Daily   heparin injection (subcutaneous)  5,000 Units Subcutaneous Q8H   insulin aspart  0-9 Units Subcutaneous Q4H   multivitamin with minerals  1 tablet Oral Daily   thiamine  100 mg Oral Daily   Or   thiamine  100 mg Intravenous Daily   Continuous Infusions:  lactated ringers 125 mL/hr at 09/04/19 0600   piperacillin-tazobactam (ZOSYN)  IV 3.375 g (09/04/19 0853)    Assessment & Plan:   1.  Recurrent right upper quadrant pain: Recent extensive work-up unremarkable.  Patient underwent MRCP this admission showing significant findings of gallbladder wall irregularity/omental thickening and ascites concerning for infection versus malignancy versus biliary peritonitis.  Discussed with general surgery Dr. Ezzard Standing  earlier this morning who advised repeat HIDA scan which has now been performed and within normal limits.  The next step would be to obtain a diagnostic paracentesis before consideration for laparoscopic surgical intervention.  Discussed with IR, Dr. Grace Isaac who stated patient will be on scheduled for a.m. for diagnostic paracentesis.  Of note tumor markers so far not impressive with CA 19-9 at 8.0, CA-125 at 61 and AFP at 1.1.  Patient remains on empiric antibiotics with Zosyn and leukocytosis appears to have resolved from 16 K to 7K.  Appreciate GI/general surgery follow-up.  Patient remains n.p.o./IV fluids.  Will reduce IV fluid rate and concern for problem #2  2.  Acute hypoxemic respiratory failure: Suspect secondary to pleural effusion/atelectasis and and pulmonary vascular congestion reported on chest x-ray.  Will reduce IV fluids and concern for worsening respiratory status as well as ascites.  Check BNP--if elevated will check echo.  Could be aspiration related findings given several vomiting episodes.  3.  AKI: Patient serum creatinine was 0.84 on November 7.  On this presentation, patient's labs showed BUN 29 and creatinine 1.45--> peaked to 1.68.  Today at 1.58.  Could be related to ATN as well as prerenal component.  Reduce IV fluids in concern for problem #2.  Avoid nephrotoxins.  Urine lites show sodium less than 10, specific gravity high normal.  Labs in a.m.  4. Alcohol dependence/recent episode of alcohol withdrawal: Patient reports alcohol abstinence for at least a month now.  He is on CIWA protocol.  Continue multivitamin/thiamine.  Minimize oral meds and concern for problem #1  5.  GERD/hiatal hernia with esophageal dysmotility: Patient underwent EGD in last admission.  On PPI  6.  Arthritis: Avoid NSAIDs.  7.?  Borderline diabetes.  Noted to have mild hyperglycemia with blood glucose 172 low 200s.?  Stress-induced.  On sliding scale insulin.  Hemoglobin A1c at 5.6.   DVT  prophylaxis: Heparin Code Status: Full code Family / Patient Communication: Discussed  with patient and wife at bedside Disposition Plan: Home when medically cleared     LOS: 1 day    Time spent: 35 minutes    Guilford Shi, MD [facility] Pager [phone]  If 7PM-7AM, please contact night-coverage [email or web address] Password [facility] 09/04/2019, 9:32 AM

## 2019-09-04 NOTE — Progress Notes (Signed)
Nuclear med called to inquire about follow up HIDA scan, pt previous had scan completed November 1st, MD(Dr. Paulita Fujita) confirmed to repeat HIDA scan as ordered and both teams Surgery and GI are aware of previous study completed in November, Nuclear med updated. SRP RN

## 2019-09-05 ENCOUNTER — Encounter (HOSPITAL_COMMUNITY): Payer: Self-pay | Admitting: Anesthesiology

## 2019-09-05 ENCOUNTER — Inpatient Hospital Stay (HOSPITAL_COMMUNITY): Payer: PPO

## 2019-09-05 ENCOUNTER — Inpatient Hospital Stay (HOSPITAL_COMMUNITY): Payer: PPO | Admitting: Anesthesiology

## 2019-09-05 ENCOUNTER — Encounter (HOSPITAL_COMMUNITY)
Admission: EM | Disposition: A | Discharge status: Home / Self Care | Payer: Self-pay | Source: Home / Self Care | Attending: Internal Medicine

## 2019-09-05 HISTORY — PX: CHOLECYSTECTOMY: SHX55

## 2019-09-05 HISTORY — PX: LAPAROSCOPY: SHX197

## 2019-09-05 LAB — CBC WITH DIFFERENTIAL/PLATELET
Abs Immature Granulocytes: 0.04 10*3/uL (ref 0.00–0.07)
Basophils Absolute: 0 10*3/uL (ref 0.0–0.1)
Basophils Relative: 0 %
Eosinophils Absolute: 0 10*3/uL (ref 0.0–0.5)
Eosinophils Relative: 0 %
HCT: 39.7 % (ref 39.0–52.0)
Hemoglobin: 12.8 g/dL — ABNORMAL LOW (ref 13.0–17.0)
Immature Granulocytes: 0 %
Lymphocytes Relative: 9 %
Lymphs Abs: 0.9 10*3/uL (ref 0.7–4.0)
MCH: 30.8 pg (ref 26.0–34.0)
MCHC: 32.2 g/dL (ref 30.0–36.0)
MCV: 95.4 fL (ref 80.0–100.0)
Monocytes Absolute: 0.8 10*3/uL (ref 0.1–1.0)
Monocytes Relative: 8 %
Neutro Abs: 7.8 10*3/uL — ABNORMAL HIGH (ref 1.7–7.7)
Neutrophils Relative %: 83 %
Platelets: 332 10*3/uL (ref 150–400)
RBC: 4.16 MIL/uL — ABNORMAL LOW (ref 4.22–5.81)
RDW: 12.5 % (ref 11.5–15.5)
WBC: 9.6 10*3/uL (ref 4.0–10.5)
nRBC: 0 % (ref 0.0–0.2)

## 2019-09-05 LAB — COMPREHENSIVE METABOLIC PANEL
ALT: 8 U/L (ref 0–44)
AST: 12 U/L — ABNORMAL LOW (ref 15–41)
Albumin: 2.5 g/dL — ABNORMAL LOW (ref 3.5–5.0)
Alkaline Phosphatase: 33 U/L — ABNORMAL LOW (ref 38–126)
Anion gap: 9 (ref 5–15)
BUN: 31 mg/dL — ABNORMAL HIGH (ref 8–23)
CO2: 22 mmol/L (ref 22–32)
Calcium: 8 mg/dL — ABNORMAL LOW (ref 8.9–10.3)
Chloride: 105 mmol/L (ref 98–111)
Creatinine, Ser: 1.09 mg/dL (ref 0.61–1.24)
GFR calc Af Amer: >60 mL/min (ref >60)
GFR calc non Af Amer: >60 mL/min (ref >60)
Glucose, Bld: 128 mg/dL — ABNORMAL HIGH (ref 70–99)
Potassium: 4.2 mmol/L (ref 3.5–5.1)
Sodium: 136 mmol/L (ref 135–145)
Total Bilirubin: 1.4 mg/dL — ABNORMAL HIGH (ref 0.3–1.2)
Total Protein: 5.4 g/dL — ABNORMAL LOW (ref 6.5–8.1)

## 2019-09-05 LAB — GLUCOSE, CAPILLARY
Glucose-Capillary: 113 mg/dL — ABNORMAL HIGH (ref 70–99)
Glucose-Capillary: 127 mg/dL — ABNORMAL HIGH (ref 70–99)
Glucose-Capillary: 127 mg/dL — ABNORMAL HIGH (ref 70–99)
Glucose-Capillary: 138 mg/dL — ABNORMAL HIGH (ref 70–99)

## 2019-09-05 LAB — TYPE AND SCREEN
ABO/RH(D): B POS
Antibody Screen: NEGATIVE

## 2019-09-05 SURGERY — LAPAROSCOPY, DIAGNOSTIC
Anesthesia: General | Site: Abdomen

## 2019-09-05 MED ORDER — SUCCINYLCHOLINE CHLORIDE 200 MG/10ML IV SOSY
PREFILLED_SYRINGE | INTRAVENOUS | Status: AC
  Filled 2019-09-05: qty 30

## 2019-09-05 MED ORDER — ESMOLOL HCL 100 MG/10ML IV SOLN
INTRAVENOUS | Status: AC
  Filled 2019-09-05: qty 10

## 2019-09-05 MED ORDER — LACTATED RINGERS IR SOLN
Status: DC | PRN
  Administered 2019-09-05: 9000 mL

## 2019-09-05 MED ORDER — KETOROLAC TROMETHAMINE 30 MG/ML IJ SOLN: 30.0000 mg | Freq: Once | INTRAMUSCULAR | Status: DC | PRN

## 2019-09-05 MED ORDER — ONDANSETRON HCL 4 MG/2ML IJ SOLN
INTRAMUSCULAR | Status: AC
  Filled 2019-09-05: qty 2

## 2019-09-05 MED ORDER — HYDROMORPHONE HCL 1 MG/ML IJ SOLN
0.2500 mg | INTRAMUSCULAR | Status: DC | PRN
  Administered 2019-09-05: 0.5 mg via INTRAVENOUS

## 2019-09-05 MED ORDER — FENTANYL CITRATE (PF) 100 MCG/2ML IJ SOLN
INTRAMUSCULAR | Status: DC | PRN
  Administered 2019-09-05: 50 ug via INTRAVENOUS
  Administered 2019-09-05: 100 ug via INTRAVENOUS
  Administered 2019-09-05 (×4): 50 ug via INTRAVENOUS

## 2019-09-05 MED ORDER — LACTATED RINGERS IV SOLN
INTRAVENOUS | Status: DC | PRN
  Administered 2019-09-05 (×2): via INTRAVENOUS

## 2019-09-05 MED ORDER — LACTATED RINGERS IV SOLN
INTRAVENOUS | Status: DC
  Administered 2019-09-05: 13:00:00 via INTRAVENOUS

## 2019-09-05 MED ORDER — PHENYLEPHRINE 40 MCG/ML (10ML) SYRINGE FOR IV PUSH (FOR BLOOD PRESSURE SUPPORT)
PREFILLED_SYRINGE | INTRAVENOUS | Status: DC | PRN
  Administered 2019-09-05 (×5): 80 ug via INTRAVENOUS

## 2019-09-05 MED ORDER — SUGAMMADEX SODIUM 200 MG/2ML IV SOLN
INTRAVENOUS | Status: DC | PRN
  Administered 2019-09-05: 200 mg via INTRAVENOUS

## 2019-09-05 MED ORDER — MIDAZOLAM HCL 5 MG/5ML IJ SOLN
INTRAMUSCULAR | Status: DC | PRN
  Administered 2019-09-05: 2 mg via INTRAVENOUS

## 2019-09-05 MED ORDER — LIDOCAINE 2% (20 MG/ML) 5 ML SYRINGE
INTRAMUSCULAR | Status: AC
  Filled 2019-09-05: qty 5

## 2019-09-05 MED ORDER — MIDAZOLAM HCL 2 MG/2ML IJ SOLN
INTRAMUSCULAR | Status: AC
  Filled 2019-09-05: qty 2

## 2019-09-05 MED ORDER — IOHEXOL 300 MG/ML  SOLN
INTRAMUSCULAR | Status: DC | PRN
  Administered 2019-09-05: 7.5 mL

## 2019-09-05 MED ORDER — LACTATED RINGERS IV SOLN
INTRAVENOUS | Status: DC
  Administered 2019-09-05 – 2019-09-07 (×4): via INTRAVENOUS

## 2019-09-05 MED ORDER — FENTANYL CITRATE (PF) 250 MCG/5ML IJ SOLN
INTRAMUSCULAR | Status: AC
  Filled 2019-09-05: qty 5

## 2019-09-05 MED ORDER — MORPHINE SULFATE (PF) 2 MG/ML IV SOLN: 1.0000 mg | INTRAVENOUS | Status: DC | PRN

## 2019-09-05 MED ORDER — BUPIVACAINE HCL (PF) 0.25 % IJ SOLN
INTRAMUSCULAR | Status: AC
  Filled 2019-09-05: qty 30

## 2019-09-05 MED ORDER — HYDRALAZINE HCL 20 MG/ML IJ SOLN: 5.0000 mg | Freq: Four times a day (QID) | INTRAMUSCULAR | Status: DC | PRN

## 2019-09-05 MED ORDER — PANTOPRAZOLE SODIUM 40 MG IV SOLR
40.0000 mg | Freq: Once | INTRAVENOUS | Status: AC
  Administered 2019-09-05: 40 mg via INTRAVENOUS
  Filled 2019-09-05: qty 40

## 2019-09-05 MED ORDER — PHENYLEPHRINE 40 MCG/ML (10ML) SYRINGE FOR IV PUSH (FOR BLOOD PRESSURE SUPPORT)
PREFILLED_SYRINGE | INTRAVENOUS | Status: AC
  Filled 2019-09-05: qty 10

## 2019-09-05 MED ORDER — LIDOCAINE 2% (20 MG/ML) 5 ML SYRINGE
INTRAMUSCULAR | Status: DC | PRN
  Administered 2019-09-05: 100 mg via INTRAVENOUS

## 2019-09-05 MED ORDER — PROPOFOL 10 MG/ML IV BOLUS
INTRAVENOUS | Status: AC
  Filled 2019-09-05: qty 20

## 2019-09-05 MED ORDER — HYDROMORPHONE HCL 1 MG/ML IJ SOLN
INTRAMUSCULAR | Status: AC
  Filled 2019-09-05: qty 1

## 2019-09-05 MED ORDER — 0.9 % SODIUM CHLORIDE (POUR BTL) OPTIME
TOPICAL | Status: DC | PRN
  Administered 2019-09-05: 16:00:00 1000 mL

## 2019-09-05 MED ORDER — DEXAMETHASONE SODIUM PHOSPHATE 10 MG/ML IJ SOLN
INTRAMUSCULAR | Status: DC | PRN
  Administered 2019-09-05: 10 mg via INTRAVENOUS

## 2019-09-05 MED ORDER — LACTATED RINGERS IV SOLN: INTRAVENOUS | Status: DC | PRN

## 2019-09-05 MED ORDER — DEXAMETHASONE SODIUM PHOSPHATE 10 MG/ML IJ SOLN
INTRAMUSCULAR | Status: AC
  Filled 2019-09-05: qty 1

## 2019-09-05 MED ORDER — PANTOPRAZOLE SODIUM 40 MG IV SOLR
40.0000 mg | INTRAVENOUS | Status: DC
  Administered 2019-09-05 – 2019-09-06 (×2): 40 mg via INTRAVENOUS
  Filled 2019-09-05 (×2): qty 40

## 2019-09-05 MED ORDER — FENTANYL CITRATE (PF) 100 MCG/2ML IJ SOLN
INTRAMUSCULAR | Status: AC
  Filled 2019-09-05: qty 2

## 2019-09-05 MED ORDER — ROCURONIUM BROMIDE 10 MG/ML (PF) SYRINGE
PREFILLED_SYRINGE | INTRAVENOUS | Status: DC | PRN
  Administered 2019-09-05: 10 mg via INTRAVENOUS
  Administered 2019-09-05 (×2): 20 mg via INTRAVENOUS
  Administered 2019-09-05: 40 mg via INTRAVENOUS
  Administered 2019-09-05: 20 mg via INTRAVENOUS
  Administered 2019-09-05: 10 mg via INTRAVENOUS
  Administered 2019-09-05: 5 mg via INTRAVENOUS
  Administered 2019-09-05: 20 mg via INTRAVENOUS

## 2019-09-05 MED ORDER — PROPOFOL 10 MG/ML IV BOLUS
INTRAVENOUS | Status: DC | PRN
  Administered 2019-09-05: 200 mg via INTRAVENOUS

## 2019-09-05 MED ORDER — ROCURONIUM BROMIDE 10 MG/ML (PF) SYRINGE
PREFILLED_SYRINGE | INTRAVENOUS | Status: AC
  Filled 2019-09-05: qty 10

## 2019-09-05 MED ORDER — PROMETHAZINE HCL 25 MG/ML IJ SOLN: 6.2500 mg | INTRAMUSCULAR | Status: DC | PRN

## 2019-09-05 MED ORDER — ROCURONIUM BROMIDE 10 MG/ML (PF) SYRINGE
PREFILLED_SYRINGE | INTRAVENOUS | Status: AC
  Filled 2019-09-05: qty 30

## 2019-09-05 MED ORDER — BUPIVACAINE HCL (PF) 0.25 % IJ SOLN
INTRAMUSCULAR | Status: DC | PRN
  Administered 2019-09-05: 30 mL

## 2019-09-05 MED ORDER — MEPERIDINE HCL 50 MG/ML IJ SOLN: 6.2500 mg | INTRAMUSCULAR | Status: DC | PRN

## 2019-09-05 MED ORDER — SUCCINYLCHOLINE CHLORIDE 200 MG/10ML IV SOSY
PREFILLED_SYRINGE | INTRAVENOUS | Status: DC | PRN
  Administered 2019-09-05: 140 mg via INTRAVENOUS

## 2019-09-05 SURGICAL SUPPLY — 52 items
ADH SKN CLS APL DERMABOND .7 (GAUZE/BANDAGES/DRESSINGS) ×1
APL PRP STRL LF DISP 70% ISPRP (MISCELLANEOUS) ×1
APL SKNCLS STERI-STRIP NONHPOA (GAUZE/BANDAGES/DRESSINGS)
APPLIER CLIP ROT 10 11.4 M/L (STAPLE) ×3
APR CLP MED LRG 11.4X10 (STAPLE) ×1
BENZOIN TINCTURE PRP APPL 2/3 (GAUZE/BANDAGES/DRESSINGS) IMPLANT
CABLE HIGH FREQUENCY MONO STRZ (ELECTRODE) IMPLANT
CHLORAPREP W/TINT 26 (MISCELLANEOUS) ×3 IMPLANT
CHOLANGIOGRAM CATH TAUT (CATHETERS) ×2 IMPLANT
CLIP APPLIE ROT 10 11.4 M/L (STAPLE) IMPLANT
CLOSURE WOUND 1/2 X4 (GAUZE/BANDAGES/DRESSINGS)
COVER MAYO STAND STRL (DRAPES) ×2 IMPLANT
COVER SURGICAL LIGHT HANDLE (MISCELLANEOUS) ×3 IMPLANT
COVER WAND RF STERILE (DRAPES) IMPLANT
DECANTER SPIKE VIAL GLASS SM (MISCELLANEOUS) IMPLANT
DERMABOND ADVANCED (GAUZE/BANDAGES/DRESSINGS) ×2
DERMABOND ADVANCED .7 DNX12 (GAUZE/BANDAGES/DRESSINGS) IMPLANT
DRAIN CHANNEL 19F RND (DRAIN) ×6 IMPLANT
DRAPE C-ARM 42X120 X-RAY (DRAPES) ×2 IMPLANT
ELECT REM PT RETURN 15FT ADLT (MISCELLANEOUS) ×3 IMPLANT
ENDOLOOP SUT PDS II  0 18 (SUTURE) ×2
ENDOLOOP SUT PDS II 0 18 (SUTURE) IMPLANT
EVACUATOR SILICONE 100CC (DRAIN) ×6 IMPLANT
GLOVE SURG SYN 7.5  E (GLOVE) ×2
GLOVE SURG SYN 7.5 E (GLOVE) ×1 IMPLANT
GLOVE SURG SYN 7.5 PF PI (GLOVE) ×1 IMPLANT
GOWN STRL REUS W/TWL XL LVL3 (GOWN DISPOSABLE) ×9 IMPLANT
IRRIG SUCT STRYKERFLOW 2 WTIP (MISCELLANEOUS)
IRRIGATION SUCT STRKRFLW 2 WTP (MISCELLANEOUS) IMPLANT
IV CATH 14GX2 1/4 (CATHETERS) ×2 IMPLANT
IV LACTATED RINGER IRRG 3000ML (IV SOLUTION) ×6
IV LR IRRIG 3000ML ARTHROMATIC (IV SOLUTION) IMPLANT
IV SET EXTENSION CATH 6 NF (IV SETS) ×2 IMPLANT
KIT BASIN OR (CUSTOM PROCEDURE TRAY) ×3 IMPLANT
KIT TURNOVER KIT A (KITS) IMPLANT
PENCIL SMOKE EVACUATOR (MISCELLANEOUS) IMPLANT
SHEARS HARMONIC ACE PLUS 36CM (ENDOMECHANICALS) IMPLANT
SLEEVE XCEL OPT CAN 5 100 (ENDOMECHANICALS) ×3 IMPLANT
STOPCOCK 4 WAY LG BORE MALE ST (IV SETS) ×2 IMPLANT
STRIP CLOSURE SKIN 1/2X4 (GAUZE/BANDAGES/DRESSINGS) IMPLANT
SUT ETHILON 2 0 PS N (SUTURE) ×6 IMPLANT
SUT MNCRL AB 4-0 PS2 18 (SUTURE) ×3 IMPLANT
TOWEL OR 17X26 10 PK STRL BLUE (TOWEL DISPOSABLE) ×3 IMPLANT
TOWEL OR NON WOVEN STRL DISP B (DISPOSABLE) ×3 IMPLANT
TRAY FOLEY MTR SLVR 16FR STAT (SET/KITS/TRAYS/PACK) IMPLANT
TRAY LAPAROSCOPIC (CUSTOM PROCEDURE TRAY) ×3 IMPLANT
TROCAR ADV FIXATION 11X100MM (TROCAR) ×2 IMPLANT
TROCAR ADV FIXATION 5X100MM (TROCAR) ×2 IMPLANT
TROCAR BLADELESS OPT 5 100 (ENDOMECHANICALS) ×3 IMPLANT
TROCAR XCEL BLUNT TIP 100MML (ENDOMECHANICALS) IMPLANT
TROCAR XCEL NON-BLD 11X100MML (ENDOMECHANICALS) IMPLANT
TUBING ENDO SMARTCAP (MISCELLANEOUS) ×2 IMPLANT

## 2019-09-05 NOTE — Progress Notes (Signed)
Patient complains of indigestion, paged and notified on call provider  and ordered Protonix IV 20 mg once.

## 2019-09-05 NOTE — Anesthesia Procedure Notes (Signed)
Procedure Name: Intubation Date/Time: 09/05/2019 1:45 PM Performed by: Sharlette Dense, CRNA Patient Re-evaluated:Patient Re-evaluated prior to induction Oxygen Delivery Method: Circle system utilized Preoxygenation: Pre-oxygenation with 100% oxygen Induction Type: IV induction, Rapid sequence and Cricoid Pressure applied Laryngoscope Size: Mac and 4 Grade View: Grade I Tube type: Oral Tube size: 7.5 mm Number of attempts: 1 Airway Equipment and Method: Stylet Placement Confirmation: ETT inserted through vocal cords under direct vision,  positive ETCO2 and breath sounds checked- equal and bilateral Secured at: 21 cm Tube secured with: Tape Dental Injury: Teeth and Oropharynx as per pre-operative assessment

## 2019-09-05 NOTE — Plan of Care (Signed)
Notes from surgical and hospitalist teams noted.  HIDA negative again.  Unable to do paracentesis yesterday.  Diagnosis at this point unclear; plan for exploratory laparoscopy today with Dr. Lucia Gaskins for further evaluation.  No endoscopic work-up planned.  [facility] will follow along at a distance; please call with any questions.  Thank you for the consultation.

## 2019-09-05 NOTE — Anesthesia Postprocedure Evaluation (Signed)
Anesthesia Post Note  Patient: WARRICK LLERA  Procedure(s) Performed: LAPAROSCOPY DIAGNOSTIC WITH UPPER ENDO (N/A Abdomen) LAPAROSCOPIC CHOLECYSTECTOMY WITH INTRAOPERATIVE CHOLANGIOGRAM (N/A Abdomen)     Patient location during evaluation: PACU Anesthesia Type: General Level of consciousness: awake and alert Pain management: pain level controlled Vital Signs Assessment: post-procedure vital signs reviewed and stable Respiratory status: spontaneous breathing, nonlabored ventilation and respiratory function stable Cardiovascular status: blood pressure returned to baseline and stable Postop Assessment: no apparent nausea or vomiting Anesthetic complications: no    Last Vitals:  Vitals:   09/05/19 1845 09/05/19 1900  BP: (!) 139/91 (!) 152/90  Pulse: 99 96  Resp: 16 19  Temp:  36.6 C  SpO2: 96% 95%    Last Pain:  Vitals:   09/05/19 1900  TempSrc:   PainSc: 0-No pain                 Lynda Rainwater

## 2019-09-05 NOTE — Progress Notes (Signed)
PROGRESS NOTE    Corey Logan  ZOX:[id number]  DOB: 08/26/1956  DOA: 09/02/2019 PCP: Elias Else, MD  Brief Narrative:  63 y.o.malewith medical history significant of arthritis, depression, GERD, hiatal hernia, hypertensionalcohol dependence presented to ED on 11/29 with nausea, vomiting, right upper quadrant pain after he ate eggs and steak radiating to his right and left shoulder.States that he quit alcohol and have not had any for the past 1 month. Patient was recently admitted in October with RUQ pain- work-up with CT scan showed gallbladder thickening, USG showed no evidence of gallstones or significant GB wall/ CBD abnormality, HIDA scan showed normal biliary patency and normal ejection fraction. GS evaluated patient and felt that his symptoms were not related to his gallbladder disease-recommended GI eval for EGD. GI series showed small sliding hiatal hernia and GERD, mild esophageal dysmotility.While hospitalized patient developed acute alcohol withdrawal syndrome /encephalopathy with w/u including EEG/CT head  Hospital course: Patient admitted to [facility] with medical supportive management and GS/GI re-consulted. MRCP reports distal CBD stone vs artifact, GB wall Irregularity,loculated ascites within the left upper quadrant and within the perihepatic locations, nonspecific in etiology. Additionally, patchy enhancement involving the omentum and upper abdominal colonic mesentery-concerning for malignancy-Repeat CT A/P per GS along with CA 19-9, AFP, CA 125 sent. Started on CLD. HC complicated by AKI requiring IVF and acute hypoxemic respiratory failure-Noted effusions/atelectasis with pulmonary vasc congestion-COVID negative-D dimer 3.71, CT chest -ve for PE  Subjective: Patient resting comfortably when seen this morning.  Scheduled to undergo laparoscopic surgery today and NPO. Objective: Vitals:   09/05/19 0012 09/05/19 0444 09/05/19 1206 09/05/19 1825  BP: (!) 146/84 (!) 160/87  128/84 134/85  Pulse: 95 98 90 97  Resp: Temp: 99.5 F (37.5 C) 98.8 F (37.1 C) 99.5 F (37.5 C) 97.6 F (36.4 C)  TempSrc: Oral Oral Oral   SpO2: 97% 98% 97% 95%  Weight:      Height:        Intake/Output Summary (Last 24 hours) at 09/05/2019 1831 Last data filed at 09/05/2019 1827 Gross per 24 hour  Intake 3809.75 ml  Output 675 ml  Net 3134.75 ml   Filed Weights   09/03/19 0125  Weight: 81.9 kg    Physical Examination:  General exam: Appears calm and comfortable  Respiratory system: Clear to auscultation. Respiratory effort normal. Cardiovascular system: S1 & S2 heard, RRR. No JVD, murmurs, rubs, gallops or clicks. No pedal edema. Gastrointestinal system: Abdomen is nondistended, right upper quadrant tenderness without rebound. No organomegaly or masses felt. Normal bowel sounds heard. Central nervous system: Alert and oriented. No new focal neurological deficits. Extremities: No contractures, edema or joint deformities.  Skin: No rashes, lesions or ulcers Psychiatry: Judgement and insight appear normal. Mood & affect appropriate.   Data Reviewed: I have personally reviewed following labs and imaging studies  CBC: Recent Labs  Lab 09/02/19 1127 09/03/19 0304 09/04/19 0127 09/05/19 0326  WBC 16.2* 9.8 7.4 9.6  NEUTROABS  --   --   --  7.8*  HGB 18.0* 16.4 14.4 12.8*  HCT 53.1* 49.6 44.9 39.7  MCV 91.6 92.5 94.7 95.4  PLT 514* 406* 357 332   Basic Metabolic Panel: Recent Labs  Lab 09/02/19 1127 09/03/19 0304 09/03/19 1220 09/04/19 0127 09/05/19 0326  NA 136  --  137 134* 136  K 4.1  --  4.6 4.7 4.2  CL 102  --  108 104 105  CO2 21*  --  20* 20* 22  GLUCOSE 209*  --  154* 130* 128*  BUN 29*  --  47* 47* 31*  CREATININE 1.45*  --  1.68* 1.58* 1.09  CALCIUM 9.4  --  8.2* 8.1* 8.0*  MG  --  2.2  --  2.4  --   PHOS  --  3.4  --   --   --    GFR: Estimated Creatinine Clearance: 72.6 mL/min (by C-G formula based on SCr of 1.09 mg/dL).  Liver Function Tests: Recent Labs  Lab 09/02/19 1127 09/04/19 0127 09/05/19 0326  AST 17 18 12*  ALT 17 8 8   ALKPHOS 43 38 33*  BILITOT 1.3* 2.0* 1.4*  PROT 7.5 5.6* 5.4*  ALBUMIN 4.0 2.7* 2.5*   Recent Labs  Lab 09/02/19 1127  LIPASE 55*   No results for input(s): AMMONIA in the last 168 hours. Coagulation Profile: No results for input(s): INR, PROTIME in the last 168 hours. Cardiac Enzymes: No results for input(s): CKTOTAL, CKMB, CKMBINDEX, TROPONINI in the last 168 hours. BNP (last 3 results) No results for input(s): PROBNP in the last 8760 hours. HbA1C: Recent Labs    09/03/19 0304  HGBA1C 5.6   CBG: Recent Labs  Lab 09/04/19 2021 09/05/19 0009 09/05/19 0441 09/05/19 0747 09/05/19 1143  GLUCAP 120* 138* 127* 113* 127*   Lipid Profile: No results for input(s): CHOL, HDL, LDLCALC, TRIG, CHOLHDL, LDLDIRECT in the last 72 hours. Thyroid Function Tests: Recent Labs    09/03/19 0304  TSH 3.900   Anemia Panel: No results for input(s): VITAMINB12, FOLATE, FERRITIN, TIBC, IRON, RETICCTPCT in the last 72 hours. Sepsis Labs: Recent Labs  Lab 09/02/19 1601 09/03/19 0019  LATICACIDVEN 1.2 1.7    Recent Results (from the past 240 hour(s))  SARS CORONAVIRUS 2 (TAT 6-24 HRS) Nasopharyngeal Nasopharyngeal Swab     Status: None   Collection Time: 09/03/19 12:19 AM   Specimen: Nasopharyngeal Swab  Result Value Ref Range Status   SARS Coronavirus 2 NEGATIVE NEGATIVE Final    Comment: (NOTE) SARS-CoV-2 target nucleic acids are NOT DETECTED. The SARS-CoV-2 RNA is generally detectable in upper and lower respiratory specimens during the acute phase of infection. Negative results do not preclude SARS-CoV-2 infection, do not rule out co-infections with other pathogens, and should not be used as the sole basis for treatment or other patient management decisions. Negative results must be combined with clinical observations, patient history, and epidemiological  information. The expected result is Negative. Fact Sheet for Patients: 09/05/19 Fact Sheet for Healthcare Providers: HairSlick.no This test is not yet approved or cleared by the quierodirigir.com FDA and  has been authorized for detection and/or diagnosis of SARS-CoV-2 by FDA under an Emergency Use Authorization (EUA). This EUA will remain  in effect (meaning this test can be used) for the duration of the COVID-19 declaration under Section 56 4(b)(1) of the Act, 21 U.S.C. section 360bbb-3(b)(1), unless the authorization is terminated or revoked sooner. Performed at [facility], [address], [place], [place]   Surgical PCR screen     Status: None   Collection Time: 09/04/19  6:04 PM   Specimen: Nasal Mucosa; Nasal Swab  Result Value Ref Range Status   MRSA, PCR NEGATIVE NEGATIVE Final   Staphylococcus aureus NEGATIVE NEGATIVE Final    Comment: (NOTE) The Xpert SA Assay (FDA approved for NASAL specimens in patients 67 years of age and older), is one component of a comprehensive surveillance program. It is not intended  to diagnose infection nor to guide or monitor treatment. Performed at [facility], [place] [address], [place], [place] [address]       Radiology Studies: Nm Hepatobiliary Liver Func  Result Date: 09/04/2019 CLINICAL DATA:  Right upper quadrant pain EXAM: NUCLEAR MEDICINE HEPATOBILIARY IMAGING TECHNIQUE: Sequential images of the abdomen were obtained out to 60 minutes following intravenous administration of radiopharmaceutical. RADIOPHARMACEUTICALS:  7.8 mCi Tc-67m  Choletec IV COMPARISON:  CT abdomen and pelvis September 03, 2019 FINDINGS: Prompt uptake and biliary excretion of activity by the liver is seen. Gallbladder activity is visualized, consistent with patency of cystic duct. Biliary activity passes into small bowel, consistent with patent common bile duct. IMPRESSION:  Study within normal limits. Electronically Signed   By: Lowella Grip III M.D.   On: 09/04/2019 14:05        Scheduled Meds: . [MAR Hold] heparin injection (subcutaneous)  5,000 Units Subcutaneous Q8H  . [MAR Hold] insulin aspart  0-9 Units Subcutaneous Q4H  . [MAR Hold] multivitamin with minerals  1 tablet Oral Daily  . [MAR Hold] thiamine  100 mg Intravenous Daily   Continuous Infusions: . lactated ringers Stopped (09/05/19 1730)  . lactated ringers 50 mL/hr at 09/05/19 1318  . lactated ringers    . [MAR Hold] piperacillin-tazobactam (ZOSYN)  IV 3.375 g (09/05/19 0841)    Assessment & Plan:   1.  Recurrent right upper quadrant pain: Recent extensive work-up unremarkable.  Patient underwent MRCP this admission showing significant findings of gallbladder wall irregularity/omental thickening and ascites concerning for infection versus malignancy versus biliary peritonitis. Repeat HIDA scan within normal limits.Diagnostic paracentesis deferred by general surgery and plan for laparoscopic surgical intervention today. Of note tumor markers so far not impressive with CA 19-9 at 8.0, CA-125 at 61 and AFP at 1.1.  Patient remains on empiric antibiotics with Zosyn and leukocytosis appears to have resolved from 16 K to [place].  Appreciate GI/general surgery follow-up.  Patient remains n.p.o./IV fluids.   2.  Acute hypoxemic respiratory failure: Suspect secondary to pleural effusion/atelectasis and and pulmonary vascular congestion reported on chest x-ray.  Reduced IV fluid rate in concern for worsening respiratory status as well as ascites.  BNP resulted only at 41.   Chest x-ray findings could represent aspiration related findings given several vomiting episodes.  3.  AKI: Patient serum creatinine was 0.84 on November 7.  On this presentation, patient's labs showed BUN 29 and creatinine 1.45--> peaked to 1.68--> now at 1.09.  Could be related to ATN as well as prerenal component.  Reduced IV fluids  in concern for problem #2.  Avoid nephrotoxins.  Urine lites show sodium less than 10, specific gravity high normal.  Labs in a.m.  4. Alcohol dependence/recent episode of alcohol withdrawal: Patient reports alcohol abstinence for at least a month now.  He is on CIWA protocol.  Continue multivitamin/thiamine.   5.  GERD/hiatal hernia with esophageal dysmotility: Patient underwent EGD in last admission.  On PPI  6.  Arthritis: Avoid NSAIDs.  7.?  Borderline diabetes.  Noted to have mild hyperglycemia with blood glucose 172 low 200s.?  Stress-induced.  On sliding scale insulin.  Hemoglobin A1c at 5.6.   DVT prophylaxis: Heparin Code Status: Full code Family / Patient Communication: Discussed with patient  Disposition Plan: Home when medically cleared     LOS: 2 days    Time spent: 35 minutes    Guilford Shi, MD [facility] Pager [phone]  If 7PM-7AM, please contact night-coverage  [email or web address] Password [facility] 09/05/2019, 6:31 PM

## 2019-09-05 NOTE — Progress Notes (Signed)
For laparoscopic exploration today.  His diagnosis is unclear and the patient and his wife understand this.  There are many possibilities as to what I will need to do.  His labs today look good.   Normal electrolytes.  LFT's normal.  WBC - 9,600, Hgb 12.8  Alphonsa Overall, MD, [facility] Surgery Office phone:  [phone]

## 2019-09-05 NOTE — Op Note (Addendum)
09/02/2019 - 09/05/2019  6:08 PM  PATIENT:  Corey Logan, 63 y.o., male, MRN: [id number]  PREOP DIAGNOSIS:  abdominal ascites, possible cholecystitis  POSTOP DIAGNOSIS:   Bilious ascites (approximately 2,500 cc), no obvious source of the bilious ascites, saponification of omental fat, chronic cholecystitis  PROCEDURE:   Procedure(s): Diagnostic LAPAROSCOPY, evacuation of bilious ascites (90 minutes for diagnostic lap and evacuation of bilious ascites), UPPER Endoscopy, LAPAROSCOPIC CHOLECYSTECTOMY WITH INTRAOPERATIVE CHOLANGIOGRAM, biopsy of left colon appendices epiploicae [photos at the end of the dictation]  SURGEON:   Ovidio Kin, M.D.  ASSISTANT:   Knox Saliva, PA  ANESTHESIA:   general  Anesthesiologist: Leilani Able, MD; Lowella Curb, MD CRNA: Theodosia Quay, CRNA; Florene Route, CRNA; Thornell Mule, CRNA; Teheng, Ryan E, CRNA  General  EBL:  <50  ml  BLOOD ADMINISTERED: none  DRAINS: Three 19 F blake drains - RUQ, LUQ, and pelvis  LOCAL MEDICATIONS USED:   30 cc of 1/4% marcaine  SPECIMEN:   Gall bladder, bilious ascites for cultures and cytology, appendices epiploicae  COUNTS CORRECT:  YES  INDICATIONS FOR PROCEDURE:  Corey Logan is a 63 y.o. (DOB: 1955/12/22) white male whose primary care physician is Elias Else, MD and comes for laparoscopic exploration.   The indications and risks of the surgery were explained to the patient.  The risks include, but are not limited to, infection, bleeding, and nerve injury.  PROCEDURE: The patient was taken to room #4 at [facility].  He underwent a general anesthetic.  His abdomen was shaved and prepped with ChloraPrep and sterilely draped.  Both his arms were tucked to the side.  A timeout was held and the surgical checklist run.  I accessed his abdominal cavity with a 12 mm Hassan trocar placed through an infraumbilical incision.  I placed 4 additional trocars: A 12 mm trocar in the  subxiphoid location, 5 mm trocar in the right mid subcostal location, 5 mm trocar in the right lateral subcostal location, and a 5 mm trocar in the left mid abdomen.  Upon entering his abdomen what was remarkable is that had bilious ascites with some purulence throughout the entire abdomen.  I had to break up loculated collections of fluid over his liver, in the left upper quadrant, between small bowel intraloop collections, and his pelvis.  The area that seem to be the center of this injury was under the falciform ligament, over the prepyloric area of the stomach, and over the area of the head of the pancreas.  He had saponification of the fat of the omentum in this area.  The area over the prepyloric stomach was fairly densely stuck together.  I took this down.  The gallbladder was white and thickened, but not acutely inflamed and not the source of the bilious ascites.  I was concerned about a perforation of the stomach secondary to ulcer disease.  I then broke scrub and did an upper endoscopy.  I passed the Olympus endoscope down his esophagus into the stomach and into the second portion of his duodenum.  He had no stigmata of ulcer disease.  The duodenum, pylorus, antrum, stomach, and esophagus all appeared grossly normal.  His esophagogastric junction was at about 39 cm.  I then withdrew the endoscope.  I ran the small bowel twice, starting at the terminal ileum to the Ligament of Trietz.  There were some intra loop loculations of bilious ascites, but no obvious perforation, no Meckel's diverticula, and no evidence of  inflammatory bowel disease.  I am at a loss to explain what caused the patient's bilious ascites.  One of my partners, Dr. Neysa Bonito, came to the operating room to offer an opinion, I reviewed the patient's history with him, and had no additional suggestions.  Because the gallbladder has been an area of concern and because he appeared to have chronic cholecystitis with a thickened  wall of the gallbladder, I proceeded with a laparoscopic cholecystectomy.   I dissected out the distal gall bladder and Triangle of Calot.  I identified the cystic duct and shot intraoperative cholangiogram.  A clip was placed on the gall bladder side of the cystic duct.   An intra-operative cholangiogram was shot.   The intra-operative cholangiogram was shot using a cut off Taut catheter placed through a 14 gauge angiocath in the RUQ.  The Taut catheter was inserted in the cut cystic duct and secured with an endoclip.  A cholangiogram was shot with 10 cc of 1/2 strength Isoview.  Using fluoroscopy, the cholangiogram showed the flow of contrast into the common bile duct, up the hepatic radicals, and into the duodenum.  There was no mass or obstruction.  The common bile duct was large, but there was no obvious leak from the biliary system. This was a normal intra-operative cholangiogram.   The Taut catheter was removed.  The cystic duct was tripley endoclipped and the cystic artery was identified and clipped.  The gall bladder was bluntly and sharpley dissected from the gall bladder bed.  After the gall bladder was removed from the liver, the gall bladder bed and Triangle of Calot were inspected.  There was no bleeding or bile leak.  The gall bladder was placed in a Ecco Sac bag and delivered through the umbilicus.   Of note, the patient has a history of drinking, but his liver looked in good shape.  I spent the remainder of the operation washing out the bilious ascites.  I used 9,000 of irrigant.  I placed three 19 F Blake drains - through the right upper quadrant lateral 5 mm trocar I placed a drain in the gall bladder bed and over the antrum of the stomach.  Through a left upper quadrant 5 mm trocar (this was placed for the drain at the end of the operation), I placed a 19 F Blake drain under the left lobe of the liver.  And through the left mid abdominal trocar, I placed a 28 F Blake drain in the pelvis.   The drains were sewn in place with 2-0 Nylon sutures.  Between the diagnostic laparoscopy, running the bowel, and irrigating out the bilious I spent 90 minutes.  This was in addition to the cholecystectomy.  I infiltrated 30 cc of 1/4% Marcaine into the incisions.  I closed the umbilical incision with a 2-0 Vicryl suture.   I closed each skin wound with 4-0 Monocryl suture and painted the wounds with Dermabond.   The patient tolerated the procedure well and was sent to the recovery room in good condition.   Some pictures are missing. The three pictures above are similar.  They are taken looking at the gall bladder and the undersurface of the right lobe of the liver.  The saponification of fat in the omentum (it looks like gall stones, but it is not) can be seen in the right lower aspect of each picture.   Alphonsa Overall, MD, [facility] Surgery Office phone:  [phone]

## 2019-09-05 NOTE — Anesthesia Preprocedure Evaluation (Signed)
Anesthesia Evaluation    Airway Mallampati: I       Dental no notable dental hx. (+) Teeth Intact   Pulmonary neg pulmonary ROS,    Pulmonary exam normal breath sounds clear to auscultation       Cardiovascular hypertension, Normal cardiovascular exam Rhythm:Regular Rate:Normal     Neuro/Psych Depression negative neurological ROS     GI/Hepatic GERD  Medicated,(+)     substance abuse  alcohol use,   Endo/Other  negative endocrine ROS  Renal/GU   negative genitourinary   Musculoskeletal   Abdominal Normal abdominal exam  (+)   Peds  Hematology negative hematology ROS (+)   Anesthesia Other Findings   Reproductive/Obstetrics                             Anesthesia Physical Anesthesia Plan  ASA: II  Anesthesia Plan: General   Post-op Pain Management:    Induction: Intravenous  PONV Risk Score and Plan: 4 or greater and Ondansetron, Dexamethasone, Midazolam and Treatment may vary due to age or medical condition  Airway Management Planned: Oral ETT  Additional Equipment: None  Intra-op Plan:   Post-operative Plan: Extubation in OR  Informed Consent: I have reviewed the patients History and Physical, chart, labs and discussed the procedure including the risks, benefits and alternatives for the proposed anesthesia with the patient or authorized representative who has indicated his/her understanding and acceptance.     Dental advisory given  Plan Discussed with: CRNA  Anesthesia Plan Comments:         Anesthesia Quick Evaluation

## 2019-09-05 NOTE — Transfer of Care (Signed)
Immediate Anesthesia Transfer of Care Note  Patient: Corey Logan  Procedure(s) Performed: LAPAROSCOPY DIAGNOSTIC WITH UPPER ENDO (N/A Abdomen) LAPAROSCOPIC CHOLECYSTECTOMY WITH INTRAOPERATIVE CHOLANGIOGRAM (N/A Abdomen)  Patient Location: PACU  Anesthesia Type:General  Level of Consciousness: awake, oriented, patient cooperative and responds to stimulation  Airway & Oxygen Therapy: Patient Spontanous Breathing and Patient connected to face mask oxygen  Post-op Assessment: Report given to RN and Post -op Vital signs reviewed and stable  Post vital signs: Reviewed and stable  Last Vitals:  Vitals Value Taken Time  BP 134/85 09/05/19 1825  Temp    Pulse 96 09/05/19 1826  Resp 14 09/05/19 1826  SpO2 98 % 09/05/19 1826  Vitals shown include unvalidated device data.  Last Pain:  Vitals:   09/05/19 1206  TempSrc: Oral  PainSc:       Patients Stated Pain Goal: 3 (34/74/25 9563)  Complications: No apparent anesthesia complications

## 2019-09-06 ENCOUNTER — Encounter (HOSPITAL_COMMUNITY): Payer: Self-pay | Admitting: Surgery

## 2019-09-06 DIAGNOSIS — R188 Other ascites: Secondary | ICD-10-CM

## 2019-09-06 LAB — COMPREHENSIVE METABOLIC PANEL
ALT: 26 U/L (ref 0–44)
AST: 36 U/L (ref 15–41)
Albumin: 2.1 g/dL — ABNORMAL LOW (ref 3.5–5.0)
Alkaline Phosphatase: 38 U/L (ref 38–126)
Anion gap: 12 (ref 5–15)
BUN: 25 mg/dL — ABNORMAL HIGH (ref 8–23)
CO2: 23 mmol/L (ref 22–32)
Calcium: 7.8 mg/dL — ABNORMAL LOW (ref 8.9–10.3)
Chloride: 103 mmol/L (ref 98–111)
Creatinine, Ser: 0.93 mg/dL (ref 0.61–1.24)
GFR calc Af Amer: >60 mL/min (ref >60)
GFR calc non Af Amer: >60 mL/min (ref >60)
Glucose, Bld: 143 mg/dL — ABNORMAL HIGH (ref 70–99)
Potassium: 4.6 mmol/L (ref 3.5–5.1)
Sodium: 138 mmol/L (ref 135–145)
Total Bilirubin: 1 mg/dL (ref 0.3–1.2)
Total Protein: 5.1 g/dL — ABNORMAL LOW (ref 6.5–8.1)

## 2019-09-06 LAB — GLUCOSE, CAPILLARY
Glucose-Capillary: 129 mg/dL — ABNORMAL HIGH (ref 70–99)
Glucose-Capillary: 129 mg/dL — ABNORMAL HIGH (ref 70–99)
Glucose-Capillary: 130 mg/dL — ABNORMAL HIGH (ref 70–99)
Glucose-Capillary: 132 mg/dL — ABNORMAL HIGH (ref 70–99)
Glucose-Capillary: 133 mg/dL — ABNORMAL HIGH (ref 70–99)
Glucose-Capillary: 134 mg/dL — ABNORMAL HIGH (ref 70–99)
Glucose-Capillary: 140 mg/dL — ABNORMAL HIGH (ref 70–99)

## 2019-09-06 LAB — CBC WITH DIFFERENTIAL/PLATELET
Abs Immature Granulocytes: 0.1 10*3/uL — ABNORMAL HIGH (ref 0.00–0.07)
Basophils Absolute: 0 10*3/uL (ref 0.0–0.1)
Basophils Relative: 0 %
Eosinophils Absolute: 0 10*3/uL (ref 0.0–0.5)
Eosinophils Relative: 0 %
HCT: 38 % — ABNORMAL LOW (ref 39.0–52.0)
Hemoglobin: 11.9 g/dL — ABNORMAL LOW (ref 13.0–17.0)
Immature Granulocytes: 1 %
Lymphocytes Relative: 6 %
Lymphs Abs: 0.6 10*3/uL — ABNORMAL LOW (ref 0.7–4.0)
MCH: 30.4 pg (ref 26.0–34.0)
MCHC: 31.3 g/dL (ref 30.0–36.0)
MCV: 96.9 fL (ref 80.0–100.0)
Monocytes Absolute: 0.5 10*3/uL (ref 0.1–1.0)
Monocytes Relative: 5 %
Neutro Abs: 9.4 10*3/uL — ABNORMAL HIGH (ref 1.7–7.7)
Neutrophils Relative %: 88 %
Platelets: 318 10*3/uL (ref 150–400)
RBC: 3.92 MIL/uL — ABNORMAL LOW (ref 4.22–5.81)
RDW: 12.6 % (ref 11.5–15.5)
WBC: 10.7 10*3/uL — ABNORMAL HIGH (ref 4.0–10.5)
nRBC: 0 % (ref 0.0–0.2)

## 2019-09-06 LAB — GRAM STAIN

## 2019-09-06 NOTE — Care Management Important Message (Signed)
Important Message  Patient Details IM Letter given to Cookie McGibboney RN to present to the Patient Name: Corey Logan MRN: [id number] Date of Birth: December 26, 1955   Medicare Important Message Given:  Yes     Kerin Salen 09/06/2019, 12:45 PM

## 2019-09-06 NOTE — Progress Notes (Signed)
PROGRESS NOTE    Corey Logan  XBJ:[id number]  DOB: 1955-10-27  DOA: 09/02/2019 PCP: Elias Else, MD  Brief Narrative:  63 y.o.malewith medical history significant of arthritis, depression, GERD, hiatal hernia, hypertensionalcohol dependence presented to ED on 11/29 with nausea, vomiting, right upper quadrant pain after he ate eggs and steak radiating to his right and left shoulder.States that he quit alcohol and have not had any for the past 1 month. Patient was recently admitted in October with RUQ pain- work-up with CT scan showed gallbladder thickening, USG showed no evidence of gallstones or significant GB wall/ CBD abnormality, HIDA scan showed normal biliary patency and normal ejection fraction. GS evaluated patient and felt that his symptoms were not related to his gallbladder disease-recommended GI eval for EGD. GI series showed small sliding hiatal hernia and GERD, mild esophageal dysmotility.While hospitalized patient developed acute alcohol withdrawal syndrome /encephalopathy with w/u including EEG/CT head  Hospital course: Patient admitted to [facility] with medical supportive management and GS/GI re-consulted. MRCP reports distal CBD stone vs artifact, GB wall Irregularity,loculated ascites within the left upper quadrant and within the perihepatic locations, nonspecific in etiology. Additionally, patchy enhancement involving the omentum and upper abdominal colonic mesentery-concerning for malignancy-Repeat CT A/P per GS along with CA 19-9, AFP, CA 125 sent. Started on CLD. HC complicated by AKI requiring IVF and acute hypoxemic respiratory failure-Noted effusions/atelectasis with pulmonary vasc congestion-COVID negative-D dimer 3.71, CT chest -ve for PE  Subjective: Patient postoperative day 1.  Intraoperative findings consistent with biliary ascites possibly gallbladder source-given gallbladder thickening and possibility of chronic cholecystitis, cholecystectomy was performed as well.   After abdominal irrigation, 3 drains placed.  He is resting comfortably, he is off O2 today.  Wife at bedside.  Reports some pain at surgical site  Objective: Vitals:   09/06/19 0216 09/06/19 0603 09/06/19 0826 09/06/19 1430  BP: 128/74 (!) 153/79 (!) 158/77 (!) 150/78  Pulse: 84 88 86 85  Resp: Temp: 98 F (36.7 C) 98.4 F (36.9 C) 99 F (37.2 C) 97.7 F (36.5 C)  TempSrc: Oral Oral Oral Oral  SpO2: 96% 100% 98% 93%  Weight:      Height:        Intake/Output Summary (Last 24 hours) at 09/06/2019 1442 Last data filed at 09/06/2019 1301 Gross per 24 hour  Intake 2804.42 ml  Output 1500 ml  Net 1304.42 ml   Filed Weights   09/03/19 0125  Weight: 81.9 kg    Physical Examination:  General exam: Appears calm and comfortable  Respiratory system: Clear to auscultation. Respiratory effort normal. Cardiovascular system: S1 & S2 heard, RRR. No JVD, murmurs, rubs, gallops or clicks. No pedal edema. Gastrointestinal system: Abdomen is mildly bloated appearance, surgical sutures in place at laparoscopic site.  Expected tenderness at surgical site, no organomegaly or masses felt. Normal bowel sounds heard. Central nervous system: Alert and oriented. No new focal neurological deficits. Extremities: No contractures, edema or joint deformities.  Skin: No rashes, lesions or ulcers Psychiatry: Judgement and insight appear normal. Mood & affect appropriate.   Data Reviewed: I have personally reviewed following labs and imaging studies  CBC: Recent Labs  Lab 09/02/19 1127 09/03/19 0304 09/04/19 0127 09/05/19 0326 09/06/19 0351  WBC 16.2* 9.8 7.4 9.6 10.7*  NEUTROABS  --   --   --  7.8* 9.4*  HGB 18.0* 16.4 14.4 12.8* 11.9*  HCT 53.1* 49.6 44.9 39.7 38.0*  MCV 91.6 92.5 94.7 95.4 96.9  PLT  514* 406* 357 332 315   Basic Metabolic Panel: Recent Labs  Lab 09/02/19 1127 09/03/19 0304 09/03/19 1220 09/04/19 0127 09/05/19 0326 09/06/19 0351  NA 136  --  137 134*  136 138  K 4.1  --  4.6 4.7 4.2 4.6  CL 102  --  108 104 105 103  CO2 21*  --  20* 20* 22 23  GLUCOSE 209*  --  154* 130* 128* 143*  BUN 29*  --  47* 47* 31* 25*  CREATININE 1.45*  --  1.68* 1.58* 1.09 0.93  CALCIUM 9.4  --  8.2* 8.1* 8.0* 7.8*  MG  --  2.2  --  2.4  --   --   PHOS  --  3.4  --   --   --   --    GFR: Estimated Creatinine Clearance: 85 mL/min (by C-G formula based on SCr of 0.93 mg/dL). Liver Function Tests: Recent Labs  Lab 09/02/19 1127 09/04/19 0127 09/05/19 0326 09/06/19 0351  AST 17 18 12* 36  ALT 17 8 8 26   ALKPHOS 43 38 33* 38  BILITOT 1.3* 2.0* 1.4* 1.0  PROT 7.5 5.6* 5.4* 5.1*  ALBUMIN 4.0 2.7* 2.5* 2.1*   Recent Labs  Lab 09/02/19 1127  LIPASE 55*   No results for input(s): AMMONIA in the last 168 hours. Coagulation Profile: No results for input(s): INR, PROTIME in the last 168 hours. Cardiac Enzymes: No results for input(s): CKTOTAL, CKMB, CKMBINDEX, TROPONINI in the last 168 hours. BNP (last 3 results) No results for input(s): PROBNP in the last 8760 hours. HbA1C: No results for input(s): HGBA1C in the last 72 hours. CBG: Recent Labs  Lab 09/05/19 1143 09/06/19 0002 09/06/19 0408 09/06/19 0740 09/06/19 1116  GLUCAP 127* 132* 134* 140* 129*   Lipid Profile: No results for input(s): CHOL, HDL, LDLCALC, TRIG, CHOLHDL, LDLDIRECT in the last 72 hours. Thyroid Function Tests: No results for input(s): TSH, T4TOTAL, FREET4, T3FREE, THYROIDAB in the last 72 hours. Anemia Panel: No results for input(s): VITAMINB12, FOLATE, FERRITIN, TIBC, IRON, RETICCTPCT in the last 72 hours. Sepsis Labs: Recent Labs  Lab 09/02/19 1601 09/03/19 0019  LATICACIDVEN 1.2 1.7    Recent Results (from the past 240 hour(s))  SARS CORONAVIRUS 2 (TAT 6-24 HRS) Nasopharyngeal Nasopharyngeal Swab     Status: None   Collection Time: 09/03/19 12:19 AM   Specimen: Nasopharyngeal Swab  Result Value Ref Range Status   SARS Coronavirus 2 NEGATIVE NEGATIVE Final     Comment: (NOTE) SARS-CoV-2 target nucleic acids are NOT DETECTED. The SARS-CoV-2 RNA is generally detectable in upper and lower respiratory specimens during the acute phase of infection. Negative results do not preclude SARS-CoV-2 infection, do not rule out co-infections with other pathogens, and should not be used as the sole basis for treatment or other patient management decisions. Negative results must be combined with clinical observations, patient history, and epidemiological information. The expected result is Negative. Fact Sheet for Patients: SugarRoll.be Fact Sheet for Healthcare Providers: https://www.woods-mathews.com/ This test is not yet approved or cleared by the [place] FDA and  has been authorized for detection and/or diagnosis of SARS-CoV-2 by FDA under an Emergency Use Authorization (EUA). This EUA will remain  in effect (meaning this test can be used) for the duration of the COVID-19 declaration under Section 56 4(b)(1) of the Act, 21 U.S.C. section 360bbb-3(b)(1), unless the authorization is terminated or revoked sooner. Performed at [facility], [place] [address] [place], [place]  [id number]   Surgical PCR screen     Status: None   Collection Time: 09/04/19  6:04 PM   Specimen: Nasal Mucosa; Nasal Swab  Result Value Ref Range Status   MRSA, PCR NEGATIVE NEGATIVE Final   Staphylococcus aureus NEGATIVE NEGATIVE Final    Comment: (NOTE) The Xpert SA Assay (FDA approved for NASAL specimens in patients 62 years of age and older), is one component of a comprehensive surveillance program. It is not intended to diagnose infection nor to guide or monitor treatment. Performed at [facility], [address] [place], [place] [address]   Gram stain     Status: None   Collection Time: 09/05/19  4:56 PM   Specimen: Peritoneal Washings  Result Value Ref Range Status   Specimen Description  PERITONEAL  Final   Special Requests NONE  Final   Gram Stain   Final    FEW WBC PRESENT,BOTH PMN AND MONONUCLEAR NO ORGANISMS SEEN Performed at [facility], [address] [place], [place] [address]    Report Status 09/06/2019 FINAL  Final      Radiology Studies: Dg Cholangiogram Operative  Result Date: 09/06/2019 CLINICAL DATA:  Gallbladder wall thickening EXAM: INTRAOPERATIVE CHOLANGIOGRAM TECHNIQUE: Cholangiographic images from the C-arm fluoroscopic device were submitted for interpretation post-operatively. Please see the procedural report for the amount of contrast and the fluoroscopy time utilized. COMPARISON:  CT 09/03/2019 FINDINGS: No persistent filling defects in the common duct. Intrahepatic ducts are incompletely visualized, appearing decompressed centrally. Contrast passes into the duodenum. : Negative for retained common duct stone. Electronically Signed   By: Corlis Leak  Hassell M.D.   On: 09/06/2019 05:45        Scheduled Meds: . heparin injection (subcutaneous)  5,000 Units Subcutaneous Q8H  . insulin aspart  0-9 Units Subcutaneous Q4H  . multivitamin with minerals  1 tablet Oral Daily  . pantoprazole (PROTONIX) IV  40 mg Intravenous Q24H  . thiamine  100 mg Intravenous Daily   Continuous Infusions: . lactated ringers 125 mL/hr at 09/06/19 0606  . piperacillin-tazobactam (ZOSYN)  IV 3.375 g (09/06/19 0820)    Assessment & Plan:   1.  Bilious ascites with saponification of omental fat, chronic cholecystitis:   Patient underwent MRCP this admission showing significant findings of gallbladder wall irregularity/omental thickening and ascites concerning for infection versus malignancy versus biliary peritonitis. Repeat HIDA scan within normal limits.patient underwent laparoscopic evacuation of bilious ascites, intraoperative cholangiogram (which was unremarkable) followed by cholecystectomy as well as EGD which was unremarkable.  Body fluid sent for culture/cytology.  Tumor markers so far not impressive with CA 19-9 at 8.0, CA-125 at 61 and AFP at 1.1.  Of note, small vascular nodule noted on an appendiceal epiploicum that was biopsied during surgery.  Patient remains on empiric antibiotics with Zosyn and leukocytosis appears to have resolved from 16 K to 7K.  Patient remains on IV fluids at 125/h, started on clear liquid diet today.  Out of bed to chair and encourage mobility per GS.  Incentive spirometry.   2.  Acute hypoxemic respiratory failure: Suspect secondary to pleural effusion/atelectasis. BNP resulted only at 41.   Chest x-ray findings could represent aspiration related findings given several vomiting episodes.  He was requiring 2 to 3 L of O2 via nasal cannula until yesterday.  Postoperatively much improved and saturating well on room air.  Incentive spirometry at bedside.  Will repeat chest x-ray if gets hypoxic again  3.  AKI: Patient serum creatinine  was 0.84 on November 7.  On this presentation, patient's labs showed BUN 29 and creatinine 1.45--> peaked to 1.68-->1.09-->0.9.  Could be related to ATN as well as prerenal component.  Avoid nephrotoxins.  Urine lites show sodium less than 10, specific gravity high normal.    4. Alcohol dependence/recent episode of alcohol withdrawal: Patient reports alcohol abstinence for at least a month now.  He is on CIWA protocol.  Continue multivitamin/thiamine.   5.  GERD/hiatal hernia with esophageal dysmotility: Patient underwent EGD in last admission.  On PPI  6.  Mild anemia: Hemoglobin down trended from 14.4-11.9 during the hospital course.  Likely dilutional as well as mild postoperative blood loss anemia.  Continue to monitor.  7.?  Borderline diabetes.  Noted to have mild hyperglycemia with blood glucose 172 low 200s.?  Stress-induced.  On sliding scale insulin.  Hemoglobin A1c at 5.6.   8. Arthritis: Avoid NSAIDs.    DVT prophylaxis: Heparin Code Status: Full code Family / Patient Communication:  Discussed with patient  Disposition Plan: Home when medically cleared     LOS: 3 days    Time spent: 35 minutes    Alessandra Bevels, MD [facility] Pager [phone]  If 7PM-7AM, please contact night-coverage [email or web address] Password [email or web address] 09/06/2019, 2:42 PM

## 2019-09-06 NOTE — Progress Notes (Addendum)
Patient ID: Corey Logan, male   DOB: Apr 28, 1956, 63 y.o.   MRN: [id number]    1 Day Post-Op  Subjective: Patient feels ok today.  Some pain at incisions, but no worse pain.  No nausea today.  Passing some flatus.  ROS: See above, otherwise other systems negative  Objective: Vital signs in last 24 hours: Temp:  [97.6 F (36.4 C)-99.5 F (37.5 C)] 99 F (37.2 C) (12/03 0826) Pulse Rate:  [84-99] 86 (12/03 0826) Resp:  [15-19] 19 (12/03 0826) BP: (128-160)/(74-91) 158/77 (12/03 0826) SpO2:  [95 %-100 %] 98 % (12/03 0826) Last BM Date: 09/05/19  Intake/Output from previous day: 12/02 0701 - 12/03 0700 In: 3202.3 [I.V.:3057.1; IV Piggyback:145.2] Out: 5188 [Urine:600; Drains:260; Blood:200] Intake/Output this shift: Total I/O In: -  Out: 60 [Drains:40]  PE: Abd: soft, appropriately tender, some BS, mild bloating, incisions c/d/i, RUQ JP with minimal serosang output.  LLQ with some serous fluid, LUQ with thin brown serous fluid  Lab Results:  Recent Labs    09/05/19 0326 09/06/19 0351  WBC 9.6 10.7*  HGB 12.8* 11.9*  HCT 39.7 38.0*  PLT 332 318   BMET Recent Labs    09/05/19 0326 09/06/19 0351  NA 136 138  K 4.2 4.6  CL 105 103  CO2 22 23  GLUCOSE 128* 143*  BUN 31* 25*  CREATININE 1.09 0.93  CALCIUM 8.0* 7.8*   PT/INR No results for input(s): LABPROT, INR in the last 72 hours. CMP     Component Value Date/Time   NA 138 09/06/2019 0351   K 4.6 09/06/2019 0351   CL 103 09/06/2019 0351   CO2 23 09/06/2019 0351   GLUCOSE 143 (H) 09/06/2019 0351   BUN 25 (H) 09/06/2019 0351   CREATININE 0.93 09/06/2019 0351   CALCIUM 7.8 (L) 09/06/2019 0351   PROT 5.1 (L) 09/06/2019 0351   ALBUMIN 2.1 (L) 09/06/2019 0351   AST 36 09/06/2019 0351   ALT 26 09/06/2019 0351   ALKPHOS 38 09/06/2019 0351   BILITOT 1.0 09/06/2019 0351   GFRNONAA >60 09/06/2019 0351   GFRAA >60 09/06/2019 0351   Lipase     Component Value Date/Time   LIPASE 55 (H) 09/02/2019 1127       Studies/Results: Dg Cholangiogram Operative  Result Date: 09/06/2019 CLINICAL DATA:  Gallbladder wall thickening EXAM: INTRAOPERATIVE CHOLANGIOGRAM TECHNIQUE: Cholangiographic images from the C-arm fluoroscopic device were submitted for interpretation post-operatively. Please see the procedural report for the amount of contrast and the fluoroscopy time utilized. COMPARISON:  CT 09/03/2019 FINDINGS: No persistent filling defects in the common duct. Intrahepatic ducts are incompletely visualized, appearing decompressed centrally. Contrast passes into the duodenum. : Negative for retained common duct stone. Electronically Signed   By: Lucrezia Europe M.D.   On: 09/06/2019 05:45   Nm Hepatobiliary Liver Func  Result Date: 09/04/2019 CLINICAL DATA:  Right upper quadrant pain EXAM: NUCLEAR MEDICINE HEPATOBILIARY IMAGING TECHNIQUE: Sequential images of the abdomen were obtained out to 60 minutes following intravenous administration of radiopharmaceutical. RADIOPHARMACEUTICALS:  7.8 mCi Tc-48m  Choletec IV COMPARISON:  CT abdomen and pelvis September 03, 2019 FINDINGS: Prompt uptake and biliary excretion of activity by the liver is seen. Gallbladder activity is visualized, consistent with patency of cystic duct. Biliary activity passes into small bowel, consistent with patent common bile duct. IMPRESSION: Study within normal limits. Electronically Signed   By: Lowella Grip III M.D.   On: 09/04/2019 14:05    Anti-infectives: Anti-infectives (From admission, onward)  Start     Dose/Rate Route Frequency Ordered Stop   09/04/19 0000  piperacillin-tazobactam (ZOSYN) IVPB 3.375 g     3.375 g 12.5 mL/hr over 240 Minutes Intravenous Every 8 hours 09/03/19 1828     09/03/19 1830  piperacillin-tazobactam (ZOSYN) IVPB 3.375 g     3.375 g 100 mL/hr over 30 Minutes Intravenous STAT 09/03/19 1828 09/03/19 1916       Assessment/Plan H/o heavy ETOH abuse with recent DTs - states he has abstained since recent  admission HTN   POD 1, s/p dx lap with drainage of bilious ascites, cholecystectomy, biopsy of appendiceal epiploicum, Dr. Ezzard Standing 12/2 -unclear etiology still of diffuse ascites, but did not appear to be secondary to the gallbladder at all. -gallbladder was removed to minimize potentiality moving forward. -significant saponification was noted but this was expected secondary to bilious ascites. -small vascular nodule noted on an appendiceal epiploicum that was biopsied -ascites sent for cx and cytology -ok to start CLD today -needs to get up and mobilize!! -pulm toilet and IS  -will need about 5 days of post op abx therapy   FEN -CLD VTE - heparin ID -zosyn 12/1-->   LOS: 3 days    Letha Cape , [facility] Surgery 09/06/2019, 11:28 AM Please see Amion for pager number during day hours 7:00am-4:30pm  Agree with above.  Tolerating liquids.  Got up and walked.  He feels better. Overall improving, though unclear what caused the biliary ascites.  Ovidio Kin, MD, [facility] Surgery Office phone:  [phone]

## 2019-09-07 LAB — CBC
HCT: 35.4 % — ABNORMAL LOW (ref 39.0–52.0)
Hemoglobin: 11.3 g/dL — ABNORMAL LOW (ref 13.0–17.0)
MCH: 30.5 pg (ref 26.0–34.0)
MCHC: 31.9 g/dL (ref 30.0–36.0)
MCV: 95.4 fL (ref 80.0–100.0)
Platelets: 281 10*3/uL (ref 150–400)
RBC: 3.71 MIL/uL — ABNORMAL LOW (ref 4.22–5.81)
RDW: 12.4 % (ref 11.5–15.5)
WBC: 10.5 10*3/uL (ref 4.0–10.5)
nRBC: 0 % (ref 0.0–0.2)

## 2019-09-07 LAB — BASIC METABOLIC PANEL
Anion gap: 10 (ref 5–15)
BUN: 19 mg/dL (ref 8–23)
CO2: 26 mmol/L (ref 22–32)
Calcium: 7.9 mg/dL — ABNORMAL LOW (ref 8.9–10.3)
Chloride: 103 mmol/L (ref 98–111)
Creatinine, Ser: 0.77 mg/dL (ref 0.61–1.24)
GFR calc Af Amer: >60 mL/min (ref >60)
GFR calc non Af Amer: >60 mL/min (ref >60)
Glucose, Bld: 121 mg/dL — ABNORMAL HIGH (ref 70–99)
Potassium: 3.8 mmol/L (ref 3.5–5.1)
Sodium: 139 mmol/L (ref 135–145)

## 2019-09-07 LAB — GLUCOSE, CAPILLARY
Glucose-Capillary: 116 mg/dL — ABNORMAL HIGH (ref 70–99)
Glucose-Capillary: 102 mg/dL — ABNORMAL HIGH (ref 70–99)
Glucose-Capillary: 107 mg/dL — ABNORMAL HIGH (ref 70–99)
Glucose-Capillary: 105 mg/dL — ABNORMAL HIGH (ref 70–99)
Glucose-Capillary: 104 mg/dL — ABNORMAL HIGH (ref 70–99)

## 2019-09-07 LAB — SURGICAL PATHOLOGY

## 2019-09-07 LAB — CYTOLOGY - NON PAP

## 2019-09-07 MED ORDER — VITAMIN B-1 100 MG PO TABS
100.0000 mg | ORAL_TABLET | Freq: Every day | ORAL | Status: DC
  Administered 2019-09-08 – 2019-09-13 (×5): 100 mg via ORAL
  Filled 2019-09-07 (×5): qty 1

## 2019-09-07 MED ORDER — PANTOPRAZOLE SODIUM 40 MG PO TBEC
40.0000 mg | DELAYED_RELEASE_TABLET | Freq: Every day | ORAL | Status: DC
  Administered 2019-09-07 – 2019-09-13 (×6): 40 mg via ORAL
  Filled 2019-09-07 (×6): qty 1

## 2019-09-07 MED ORDER — PHENYLEPHRINE HCL-NACL 10-0.9 MG/250ML-% IV SOLN
INTRAVENOUS | Status: AC
  Filled 2019-09-07: qty 750

## 2019-09-07 MED ORDER — OXYCODONE HCL 5 MG PO TABS
5.0000 mg | ORAL_TABLET | ORAL | Status: DC | PRN
  Administered 2019-09-09 – 2019-09-12 (×2): 5 mg via ORAL
  Administered 2019-09-13: 10 mg via ORAL
  Filled 2019-09-07: qty 2
  Filled 2019-09-07 (×2): qty 1

## 2019-09-07 NOTE — Progress Notes (Signed)
PROGRESS NOTE    Corey Logan  GNF:[id number]  DOB: Apr 03, 1956  DOA: 09/02/2019 PCP: Elias Else, MD  Brief Narrative:  63 y.o.malewith medical history significant of arthritis, depression, GERD, hiatal hernia, hypertensionalcohol dependence presented to ED on 11/29 with nausea, vomiting, right upper quadrant pain after he ate eggs and steak radiating to his right and left shoulder.States that he quit alcohol and have not had any for the past 1 month. Patient was recently admitted in October with RUQ pain- work-up with CT scan showed gallbladder thickening, USG showed no evidence of gallstones or significant GB wall/ CBD abnormality, HIDA scan showed normal biliary patency and normal ejection fraction. GS evaluated patient and felt that his symptoms were not related to his gallbladder disease-recommended GI eval for EGD. GI series showed small sliding hiatal hernia and GERD, mild esophageal dysmotility.While hospitalized patient developed acute alcohol withdrawal syndrome /encephalopathy with w/u including EEG/CT head  Hospital course: Patient admitted to [facility] with medical supportive management and GS/GI re-consulted. MRCP reports distal CBD stone vs artifact, GB wall Irregularity,loculated ascites within the left upper quadrant and within the perihepatic locations, nonspecific in etiology. Additionally, patchy enhancement involving the omentum and upper abdominal colonic mesentery-concerning for malignancy-Repeat CT A/P per GS along with CA 19-9, AFP, CA 125 sent. COVID negative-D dimer 3.71.Started on CLD. HC complicated by persistent nausea/vomiting, AKI requiring IVF and acute hypoxemic respiratory failure-Noted effusions/atelectasis on CXR- CT chest -ve for PE. Repeat HIDA wnl on 12/1. Given persistent symptoms, taken to OR on 12/2. Intraoperative findings consistent with biliary ascites possibly gallbladder source-given gallbladder thickening and possibility of chronic cholecystitis,  cholecystectomy was performed as well.  After abdominal irrigation, 3 drains placed.GS following. Resp status improved and now saturating well on RA.   Subjective: Patient postoperative day 2. He is resting comfortably.  Tolerating liquid diet well.  Still has significant drain output.  Tried to ambulate in the room and hallway yesterday.  Wife at bedside.  Objective: Vitals:   09/06/19 0826 09/06/19 1430 09/06/19 2048 09/07/19 0437  BP: (!) 158/77 (!) 150/78 (!) 144/78 (!) 144/82  Pulse: 86 85 79 74  Resp: 19 19 20 18   Temp: 99 F (37.2 C) 97.7 F (36.5 C) 98.4 F (36.9 C) 98.2 F (36.8 C)  TempSrc: Oral Oral Oral Oral  SpO2: 98% 93% 94% 95%  Weight:      Height:        Intake/Output Summary (Last 24 hours) at 09/07/2019 0841 Last data filed at 09/07/2019 0802 Gross per 24 hour  Intake 2788 ml  Output 1517 ml  Net 1271 ml   Filed Weights   09/03/19 0125  Weight: 81.9 kg    Physical Examination:  General exam: Appears calm and comfortable  Respiratory system: Clear to auscultation. Respiratory effort normal. Cardiovascular system: S1 & S2 heard, RRR. No JVD, murmurs, rubs, gallops or clicks. No pedal edema. Gastrointestinal system: Abdomen is mildly bloated appearance, surgical sutures in place at laparoscopic site.  Expected tenderness at surgical site, no organomegaly or masses felt. Normal bowel sounds heard. Central nervous system: Alert and oriented. No new focal neurological deficits. Extremities: No contractures, edema or joint deformities.  Skin: No rashes, lesions or ulcers Psychiatry: Judgement and insight appear normal. Mood & affect appropriate.   Data Reviewed: I have personally reviewed following labs and imaging studies  CBC: Recent Labs  Lab 09/03/19 0304 09/04/19 0127 09/05/19 0326 09/06/19 0351 09/07/19 0341  WBC 9.8 7.4 9.6 10.7* 10.5  NEUTROABS  --   --  7.8* 9.4*  --   HGB 16.4 14.4 12.8* 11.9* 11.3*  HCT 49.6 44.9 39.7 38.0* 35.4*  MCV  92.5 94.7 95.4 96.9 95.4  PLT 406* 357 332 318 281   Basic Metabolic Panel: Recent Labs  Lab 09/03/19 0304 09/03/19 1220 09/04/19 0127 09/05/19 0326 09/06/19 0351 09/07/19 0341  NA  --  137 134* 136 138 139  K  --  4.6 4.7 4.2 4.6 3.8  CL  --  108 104 105 103 103  CO2  --  20* 20* 22 23 26   GLUCOSE  --  154* 130* 128* 143* 121*  BUN  --  47* 47* 31* 25* 19  CREATININE  --  1.68* 1.58* 1.09 0.93 0.77  CALCIUM  --  8.2* 8.1* 8.0* 7.8* 7.9*  MG 2.2  --  2.4  --   --   --   PHOS 3.4  --   --   --   --   --    GFR: Estimated Creatinine Clearance: 98.9 mL/min (by C-G formula based on SCr of 0.77 mg/dL). Liver Function Tests: Recent Labs  Lab 09/02/19 1127 09/04/19 0127 09/05/19 0326 09/06/19 0351  AST 17 18 12* 36  ALT 17 8 8 26   ALKPHOS 43 38 33* 38  BILITOT 1.3* 2.0* 1.4* 1.0  PROT 7.5 5.6* 5.4* 5.1*  ALBUMIN 4.0 2.7* 2.5* 2.1*   Recent Labs  Lab 09/02/19 1127  LIPASE 55*   No results for input(s): AMMONIA in the last 168 hours. Coagulation Profile: No results for input(s): INR, PROTIME in the last 168 hours. Cardiac Enzymes: No results for input(s): CKTOTAL, CKMB, CKMBINDEX, TROPONINI in the last 168 hours. BNP (last 3 results) No results for input(s): PROBNP in the last 8760 hours. HbA1C: No results for input(s): HGBA1C in the last 72 hours. CBG: Recent Labs  Lab 09/06/19 1640 09/06/19 2041 09/06/19 2345 09/07/19 0433 09/07/19 0759  GLUCAP 129* 133* 130* 116* 102*   Lipid Profile: No results for input(s): CHOL, HDL, LDLCALC, TRIG, CHOLHDL, LDLDIRECT in the last 72 hours. Thyroid Function Tests: No results for input(s): TSH, T4TOTAL, FREET4, T3FREE, THYROIDAB in the last 72 hours. Anemia Panel: No results for input(s): VITAMINB12, FOLATE, FERRITIN, TIBC, IRON, RETICCTPCT in the last 72 hours. Sepsis Labs: Recent Labs  Lab 09/02/19 1601 09/03/19 0019  LATICACIDVEN 1.2 1.7    Recent Results (from the past 240 hour(s))  SARS CORONAVIRUS 2 (TAT  6-24 HRS) Nasopharyngeal Nasopharyngeal Swab     Status: None   Collection Time: 09/03/19 12:19 AM   Specimen: Nasopharyngeal Swab  Result Value Ref Range Status   SARS Coronavirus 2 NEGATIVE NEGATIVE Final    Comment: (NOTE) SARS-CoV-2 target nucleic acids are NOT DETECTED. The SARS-CoV-2 RNA is generally detectable in upper and lower respiratory specimens during the acute phase of infection. Negative results do not preclude SARS-CoV-2 infection, do not rule out co-infections with other pathogens, and should not be used as the sole basis for treatment or other patient management decisions. Negative results must be combined with clinical observations, patient history, and epidemiological information. The expected result is Negative. Fact Sheet for Patients: HairSlick.nohttps://www.fda.gov/media/138098/download Fact Sheet for Healthcare Providers: quierodirigir.comhttps://www.fda.gov/media/138095/download This test is not yet approved or cleared by the [place] FDA and  has been authorized for detection and/or diagnosis of SARS-CoV-2 by FDA under an Emergency Use Authorization (EUA). This EUA will remain  in effect (meaning this test can be used) for the duration of the COVID-19 declaration under Section 56  4(b)(1) of the Act, 21 U.S.C. section 360bbb-3(b)(1), unless the authorization is terminated or revoked sooner. Performed at [facility], [address] [facility]   Surgical PCR screen     Status: None   Collection Time: 09/04/19  6:04 PM   Specimen: Nasal Mucosa; Nasal Swab  Result Value Ref Range Status   MRSA, PCR NEGATIVE NEGATIVE Final   Staphylococcus aureus NEGATIVE NEGATIVE Final    Comment: (NOTE) The Xpert SA Assay (FDA approved for NASAL specimens in patients 2 years of age and older), is one component of a comprehensive surveillance program. It is not intended to diagnose infection nor to guide or monitor treatment. Performed at [facility],  [address], [place], [place] [address]   Gram stain     Status: None   Collection Time: 09/05/19  4:56 PM   Specimen: Peritoneal Washings  Result Value Ref Range Status   Specimen Description PERITONEAL  Final   Special Requests NONE  Final   Gram Stain   Final    FEW WBC PRESENT,BOTH PMN AND MONONUCLEAR NO ORGANISMS SEEN Performed at [facility], [address], [place], [place] [address]    Report Status 09/06/2019 FINAL  Final      Radiology Studies: Dg Cholangiogram Operative  Result Date: 09/06/2019 CLINICAL DATA:  Gallbladder wall thickening EXAM: INTRAOPERATIVE CHOLANGIOGRAM TECHNIQUE: Cholangiographic images from the C-arm fluoroscopic device were submitted for interpretation post-operatively. Please see the procedural report for the amount of contrast and the fluoroscopy time utilized. COMPARISON:  CT 09/03/2019 FINDINGS: No persistent filling defects in the common duct. Intrahepatic ducts are incompletely visualized, appearing decompressed centrally. Contrast passes into the duodenum. : Negative for retained common duct stone. Electronically Signed   By: Corlis Leak M.D.   On: 09/06/2019 05:45        Scheduled Meds:  heparin injection (subcutaneous)  5,000 Units Subcutaneous Q8H   insulin aspart  0-9 Units Subcutaneous Q4H   multivitamin with minerals  1 tablet Oral Daily   pantoprazole (PROTONIX) IV  40 mg Intravenous Q24H   thiamine  100 mg Intravenous Daily   Continuous Infusions:  lactated ringers 100 mL/hr at 09/07/19 0239   phenylephrine     piperacillin-tazobactam (ZOSYN)  IV 3.375 g (09/06/19 2347)    Assessment & Plan:   1.  Bilious ascites with saponification of omental fat, chronic cholecystitis:   Patient underwent MRCP this admission showing significant findings of gallbladder wall irregularity/omental thickening and ascites concerning for infection versus malignancy versus biliary peritonitis. Repeat HIDA scan within normal  limits.patient underwent laparoscopic evacuation of bilious ascites, intraoperative cholangiogram (which was unremarkable) followed by cholecystectomy as well as EGD which was unremarkable.  Body fluid sent for culture/cytology. Tumor markers so far not impressive with CA 19-9 at 8.0, CA-125 at 61 and AFP at 1.1.  Of note, small vascular nodule noted on an appendiceal epiploicum that was biopsied during surgery.  Patient remains on empiric antibiotics with Zosyn and leukocytosis appears to have resolved from 16 K to 7K.  Patient tolerating clear liquid diet well.  Will DC IV fluids and defer drain removal/diet advancemen general surgery. Continue to encourage mobility-incentive spirometry.   2.  Acute hypoxemic respiratory failure: Suspect secondary to pleural effusion/atelectasis. BNP resulted only at 41.   Chest x-ray findings could represent aspiration related findings given several vomiting episodes.  He was requiring 2 to 3 L of O2 via nasal cannula preoperatively.  Postoperatively much  improved and saturating well on room air.  Incentive spirometry at bedside.    3.  AKI: Patient serum creatinine was 0.84 on November 7.  On this presentation, patient's labs showed BUN 29 and creatinine 1.45--> peaked to 1.68-->1.09-->0.9.  Could be related to ATN as well as prerenal component.  Avoid nephrotoxins.  Urine lites show sodium less than 10, specific gravity high normal.    4. Alcohol dependence/recent episode of alcohol withdrawal: Patient reports alcohol abstinence for at least a month now.  Continue multivitamin/thiamine.  Can DC CIWA protocol as no signs of withdrawal so far.  5.  GERD/hiatal hernia with esophageal dysmotility: Patient underwent EGD in last admission.  On PPI  6.  Mild anemia: Hemoglobin down trended from 14.4->11.9 during the hospital course. Now stable around 11.  Likely dilutional as well as mild postoperative blood loss anemia.  Continue to monitor.  7.  Stress-induced  hyperglycemia: Noted to have mild hyperglycemia with blood glucose 172 low 200s initially, now in 100-130.   Hemoglobin A1c at 5.6.  DC sliding scale insulin.    8. Arthritis: Avoid NSAIDs.    DVT prophylaxis: Heparin Code Status: Full code Family / Patient Communication: Discussed with patient and wife Disposition Plan: Home when medically cleared     LOS: 4 days    Time spent: 35 minutes    Guilford Shi, MD [facility] Pager [phone]  If 7PM-7AM, please contact night-coverage [email or web address] Password [email or web address] 09/07/2019, 8:41 AM

## 2019-09-07 NOTE — Progress Notes (Addendum)
Patient ID: Corey Logan, male   DOB: 04-27-1956, 63 y.o.   MRN: [id number]    2 Days Post-Op  Subjective: Feels well today.  Had a BM.  Tolerating clear liquids.  No nausea.  Minimal abdominal pain.  ROS: See above, otherwise other systems negative  Objective: Vital signs in last 24 hours: Temp:  [97.7 F (36.5 C)-98.4 F (36.9 C)] 98.2 F (36.8 C) (12/04 0437) Pulse Rate:  [74-85] 74 (12/04 0437) Resp:  [18-20] 18 (12/04 0437) BP: (144-150)/(78-82) 144/82 (12/04 0437) SpO2:  [93 %-95 %] 95 % (12/04 0437) Last BM Date: 09/07/19  Intake/Output from previous day: 12/03 0701 - 12/04 0700 In: 2788 [P.O.:370; I.V.:2198; IV Piggyback:150] Out: 1187 [Urine:950; Drains:237] Intake/Output this shift: Total I/O In: 30 [Other:30] Out: 370 [Urine:250; Drains:120]  PE: Abd: soft, appropriately tender, incisions c/d/i, all 3 drains today with essentially just sero to serosang output.  Great BS  Lab Results:  Recent Labs    09/06/19 0351 09/07/19 0341  WBC 10.7* 10.5  HGB 11.9* 11.3*  HCT 38.0* 35.4*  PLT 318 281   BMET Recent Labs    09/06/19 0351 09/07/19 0341  NA 138 139  K 4.6 3.8  CL 103 103  CO2 23 26  GLUCOSE 143* 121*  BUN 25* 19  CREATININE 0.93 0.77  CALCIUM 7.8* 7.9*   PT/INR No results for input(s): LABPROT, INR in the last 72 hours. CMP     Component Value Date/Time   NA 139 09/07/2019 0341   K 3.8 09/07/2019 0341   CL 103 09/07/2019 0341   CO2 26 09/07/2019 0341   GLUCOSE 121 (H) 09/07/2019 0341   BUN 19 09/07/2019 0341   CREATININE 0.77 09/07/2019 0341   CALCIUM 7.9 (L) 09/07/2019 0341   PROT 5.1 (L) 09/06/2019 0351   ALBUMIN 2.1 (L) 09/06/2019 0351   AST 36 09/06/2019 0351   ALT 26 09/06/2019 0351   ALKPHOS 38 09/06/2019 0351   BILITOT 1.0 09/06/2019 0351   GFRNONAA >60 09/07/2019 0341   GFRAA >60 09/07/2019 0341   Lipase     Component Value Date/Time   LIPASE 55 (H) 09/02/2019 1127       Studies/Results: Dg Cholangiogram  Operative  Result Date: 09/06/2019 CLINICAL DATA:  Gallbladder wall thickening EXAM: INTRAOPERATIVE CHOLANGIOGRAM TECHNIQUE: Cholangiographic images from the C-arm fluoroscopic device were submitted for interpretation post-operatively. Please see the procedural report for the amount of contrast and the fluoroscopy time utilized. COMPARISON:  CT 09/03/2019 FINDINGS: No persistent filling defects in the common duct. Intrahepatic ducts are incompletely visualized, appearing decompressed centrally. Contrast passes into the duodenum. : Negative for retained common duct stone. Electronically Signed   By: Lucrezia Europe M.D.   On: 09/06/2019 05:45    Anti-infectives: Anti-infectives (From admission, onward)   Start     Dose/Rate Route Frequency Ordered Stop   09/04/19 0000  piperacillin-tazobactam (ZOSYN) IVPB 3.375 g     3.375 g 12.5 mL/hr over 240 Minutes Intravenous Every 8 hours 09/03/19 1828     09/03/19 1830  piperacillin-tazobactam (ZOSYN) IVPB 3.375 g     3.375 g 100 mL/hr over 30 Minutes Intravenous STAT 09/03/19 1828 09/03/19 1916       Assessment/Plan H/o heavy ETOH abuse with recent DTs - states he has abstained since recent admission HTN   POD 2, s/p dx lap with drainage of bilious ascites, cholecystectomy, biopsy of appendiceal epiploicum, Dr. Lucia Gaskins 12/2 -unclear etiology still of diffuse ascites, but did not appear to be  secondary to the gallbladder at all. -gallbladder was removed to minimize potentiality moving forward. -significant saponification was noted but this was expected secondary to bilious ascites. -small vascular nodule noted on an appendiceal epiploicum that was biopsied -ascites sent for cx and cytology, still pending -adv to regular diet -pulm toilet and IS  -will need about 5 days of post op abx therapy, will discuss about transitioning to oral abx therapy and DC IV abx therapy.   FEN -regular diet, SLIV VTE - heparin ID -zosyn 12/1-->   LOS: 4 days     Corey Logan , [facility] 09/07/2019, 10:27 AM Please see Amion for pager number during day hours 7:00am-4:30pm  Agree with above.  He is feeling better, though does not have much of an appetite and has not eaten much solid food. His wife was in the room. Still not sure what caused the bile ascites.  All his drains are clear straw fluid now.  These can come out prior to discharge.  I would pull his RUQ drain last, since that is the area of most concern during surgery, I found no cause for the bile peritonitis.  Corey Kin, MD, [facility] Surgery Office phone:  [phone]

## 2019-09-08 DIAGNOSIS — K819 Cholecystitis, unspecified: Secondary | ICD-10-CM

## 2019-09-08 DIAGNOSIS — I10 Essential (primary) hypertension: Secondary | ICD-10-CM

## 2019-09-08 LAB — GLUCOSE, CAPILLARY
Glucose-Capillary: 94 mg/dL (ref 70–99)
Glucose-Capillary: 96 mg/dL (ref 70–99)
Glucose-Capillary: 97 mg/dL (ref 70–99)

## 2019-09-08 LAB — FUNGAL STAIN REFLEX

## 2019-09-08 LAB — FUNGUS STAIN

## 2019-09-08 MED ORDER — LOPERAMIDE HCL 2 MG PO CAPS
4.0000 mg | ORAL_CAPSULE | Freq: Once | ORAL | Status: AC
  Administered 2019-09-08: 4 mg via ORAL
  Filled 2019-09-08: qty 2

## 2019-09-08 NOTE — Progress Notes (Signed)
Patient ID: Corey Logan, male   DOB: 08/20/1956, 63 y.o.   MRN: [id number]    3 Days Post-Op  Subjective: Feels well today.  Had a BM.  Tolerating bites of soft diet.  No nausea.  Minimal abdominal pain.   Objective: Vital signs in last 24 hours: Temp:  [98.7 F (37.1 C)-98.9 F (37.2 C)] 98.9 F (37.2 C) (12/05 0036) Pulse Rate:  [77-84] 84 (12/05 0036) Resp:  [20] 20 (12/05 0036) BP: (141-156)/(80-86) 156/86 (12/05 0036) SpO2:  [95 %-98 %] 95 % (12/05 0036) Last BM Date: 09/07/19  Intake/Output from previous day: 12/04 0701 - 12/05 0700 In: 1064.8 [P.O.:860; IV Piggyback:154.8] Out: 1927 [Urine:1250; Drains:677] Intake/Output this shift: No intake/output data recorded.  PE: Abd: soft, appropriately tender, incisions c/d/i, all 3 drains today with essentially just sero to serosang output.  Great BS  Lab Results:  Recent Labs    09/06/19 0351 09/07/19 0341  WBC 10.7* 10.5  HGB 11.9* 11.3*  HCT 38.0* 35.4*  PLT 318 281   BMET Recent Labs    09/06/19 0351 09/07/19 0341  NA 138 139  K 4.6 3.8  CL 103 103  CO2 23 26  GLUCOSE 143* 121*  BUN 25* 19  CREATININE 0.93 0.77  CALCIUM 7.8* 7.9*   PT/INR No results for input(s): LABPROT, INR in the last 72 hours. CMP     Component Value Date/Time   NA 139 09/07/2019 0341   K 3.8 09/07/2019 0341   CL 103 09/07/2019 0341   CO2 26 09/07/2019 0341   GLUCOSE 121 (H) 09/07/2019 0341   BUN 19 09/07/2019 0341   CREATININE 0.77 09/07/2019 0341   CALCIUM 7.9 (L) 09/07/2019 0341   PROT 5.1 (L) 09/06/2019 0351   ALBUMIN 2.1 (L) 09/06/2019 0351   AST 36 09/06/2019 0351   ALT 26 09/06/2019 0351   ALKPHOS 38 09/06/2019 0351   BILITOT 1.0 09/06/2019 0351   GFRNONAA >60 09/07/2019 0341   GFRAA >60 09/07/2019 0341   Lipase     Component Value Date/Time   LIPASE 55 (H) 09/02/2019 1127       Studies/Results: No results found.  Anti-infectives: Anti-infectives (From admission, onward)   Start     Dose/Rate  Route Frequency Ordered Stop   09/04/19 0000  piperacillin-tazobactam (ZOSYN) IVPB 3.375 g     3.375 g 12.5 mL/hr over 240 Minutes Intravenous Every 8 hours 09/03/19 1828     09/03/19 1830  piperacillin-tazobactam (ZOSYN) IVPB 3.375 g     3.375 g 100 mL/hr over 30 Minutes Intravenous STAT 09/03/19 1828 09/03/19 1916       Assessment/Plan H/o heavy ETOH abuse with recent DTs - states he has abstained since recent admission HTN   POD 3, s/p dx lap with drainage of bilious ascites, cholecystectomy, biopsy of appendiceal epiploicum, Dr. Lucia Gaskins 12/2 -unclear etiology still of diffuse ascites, but did not appear to be secondary to the gallbladder at all. -gallbladder was removed to minimize potentiality moving forward. -significant saponification was noted but this was expected secondary to bilious ascites. -small vascular nodule noted on an appendiceal epiploicum that was biopsied -ascites sent for cx and cytology, still pending -cont reg diet - keep drains in today - waiting til he eats better -pulm toilet and IS  -will need about 5 days of post op abx therapy, will discuss about transitioning to oral abx therapy and DC IV abx therapy.  FEN -regular diet, SLIV VTE - heparin ID -zosyn 12/1-->   LOS:  5 days   Marin Olp, M.D. [facility] Use [email or web address] to contact on call provider  [facility] PLAN - All his drains are clear straw fluid now.  These can come out prior to discharge.  I would pull his RUQ drain last, since that is the area of most concern during surgery, I found no cause for the bile peritonitis.

## 2019-09-08 NOTE — Progress Notes (Signed)
[facility]                                                                              Patient Demographics  Corey Logan, is a 63 y.o. male, DOB - [birth year], GLO:[id number]  Admit date - 09/02/2019   Admitting Physician Corey Baker, MD  Outpatient Primary MD for the patient is Corey Dus, MD  Outpatient specialists:   LOS - 5  days   Medical records reviewed and are as summarized below:    Chief Complaint  Patient presents with   Abdominal Pain       Brief summary   63 y.o.malewith medical history significant of arthritis, depression, GERD, hiatal hernia, hypertensionalcohol dependence presented to ED on 11/29 with nausea, vomiting, right upper quadrant pain after he ate eggs and steak radiating to his right and left shoulder.States that he quit alcohol and have not had any for the past 1 month. Patient was recently admitted in October with RUQ pain- work-up with CT scan showed gallbladder thickening, USG showed no evidence of gallstones or significant GB wall/ CBD abnormality, HIDA scan showed normal biliary patency and normal ejection fraction. GS evaluated patient and felt that his symptoms were not related to his gallbladder disease-recommended GI eval for EGD. GI series showed small sliding hiatal hernia and GERD, mild esophageal dysmotility.While hospitalized patient developed acute alcohol withdrawal syndrome /encephalopathy with w/u including EEG/CT head.   Hospital course: Patient admitted to [facility] with medical supportive management and GS/GI re-consulted. MRCP reports distal CBD stone vs artifact, GB wall Irregularity,loculated ascites within the left upper quadrant and within the perihepatic locations, nonspecific in etiology. Additionally, patchy enhancement involving the omentum and upper abdominal colonic mesentery-concerning for malignancy-Repeat CT A/P per GS along with CA 19-9, AFP, CA 125 sent. COVID negative-D dimer 3.71.Started on  CLD. HC complicated by persistent nausea/vomiting, AKI requiring IVF and acute hypoxemic respiratory failure-Noted effusions/atelectasis on CXR- CT chest -ve for PE. Repeat HIDA wnl on 12/1. Given persistent symptoms, taken to OR on 12/2. Intraoperative findings consistent with biliary ascites possibly gallbladder source-given gallbladder thickening and possibility of chronic cholecystitis, cholecystectomy was performed as well.  After abdominal irrigation, 3 drains placed.GS following. Awaiting clearance from surgery for discharge.  COVID-19 test negative  Assessment & Plan     Abdominal pain with nausea vomiting secondary to chronic cholecystitis, bilious ascites with saponification of the omental fat -MRCP showed significant findings of gallbladder wall irregularity, omental thickening and ascites concerning for infectious versus malignancy versus biliary peritonitis -Repeat HIDA scan within normal limits -General surgery was consulted, underwent laparoscopic evaluation of bilious ascites, intraoperative cholangiogram (unremarkable), cholecystectomy.  - Cytology showed reactive mesothelial cells.  Pathology consistent with chronic cholecystitis, left colon epiploicae appendices pathology consistent with benign fibroadipose tissue -EGD was unremarkable. -Tumor markers unremarkable CA 19-9 8.0, CA-125 61, AFP 1.1. -Currently tolerating regular diet, general surgery following, recommending keeping drains today, clear straw fluid, the skin, prior to discharge, pulled RUQ drain last.  Acute respiratory failure with hypoxia -Likely due to pleural effusions/atelectasis, BNP 41 -Preoperatively requiring 2 - 3 L of O2 [place], postoperatively much improved, currently on room air -Continue  pulmonary toilet, incentive spirometry   Acute kidney injury -Creatinine 0.84 on 08/11/19, creatinine during this admission peaked to 1.68, possibly ATN versus prerenal due to intractable nausea and  vomiting -Creatinine now improving, 0.7  History of alcohol dependence with recent episode of alcohol withdrawal -Patient reports abstinence for at least a month now, not in any withdrawals Continue multivitamin, thiamine  GERD/hiatal hernia with esophageal dysmotility -Underwent EGD last admission, continue PPI  Mild normocytic anemia Hemoglobin 11.3, 18.0 at the time of admission, likely hemoconcentrated due to dehydration -Currently stable, follow CBC  Elevated BP readings Continue hydralazine IV as needed with parameters.  Not on any antihypertensives at home  Arthritis Avoid NSAIDs   Code Status: Full code DVT Prophylaxis: Heparin subcu Family Communication: Discussed all imaging results, lab results, explained to the patient    Disposition Plan: Awaiting clearance from surgery regarding disposition/discharge  Time Spent in minutes 35 minutes  Procedures:  09/05/2019 Diagnostic LAPAROSCOPY, evacuation of bilious ascites (90 minutes for diagnostic lap and evacuation of bilious ascites), UPPER Endoscopy, LAPAROSCOPIC CHOLECYSTECTOMY WITH INTRAOPERATIVE CHOLANGIOGRAM, biopsy of left colon appendices epiploicae [photos at the end of the dictation]    Consultants:   Gastroenterology General surgery  Antimicrobials:   Anti-infectives (From admission, onward)   Start     Dose/Rate Route Frequency Ordered Stop   09/04/19 0000  piperacillin-tazobactam (ZOSYN) IVPB 3.375 g     3.375 g 12.5 mL/hr over 240 Minutes Intravenous Every 8 hours 09/03/19 1828     09/03/19 1830  piperacillin-tazobactam (ZOSYN) IVPB 3.375 g     3.375 g 100 mL/hr over 30 Minutes Intravenous STAT 09/03/19 1828 09/03/19 1916          Medications  Scheduled Meds:  heparin injection (subcutaneous)  5,000 Units Subcutaneous Q8H   multivitamin with minerals  1 tablet Oral Daily   pantoprazole  40 mg Oral Daily   thiamine  100 mg Oral Daily   Continuous Infusions:   piperacillin-tazobactam (ZOSYN)  IV 3.375 g (09/08/19 0950)   PRN Meds:.acetaminophen **OR** acetaminophen, hydrALAZINE, HYDROmorphone (DILAUDID) injection, labetalol, LORazepam, ondansetron **OR** ondansetron (ZOFRAN) IV, oxyCODONE      Subjective:   Corey Logan was seen and examined today.  States he is doing better still has abdominal pain, mild and improving.  Tolerating soft diet, no active nausea or vomiting.  Had a BM.  No fevers or chills patient denies dizziness, chest pain, shortness of breath, new weakness, numbess, tingling. No acute events overnight.    Objective:   Vitals:   09/06/19 2048 09/07/19 0437 09/07/19 1437 09/08/19 0036  BP: (!) 144/78 (!) 144/82 (!) 141/80 (!) 156/86  Pulse: 79 74 77 84  Resp: Temp: 98.4 F (36.9 C) 98.2 F (36.8 C) 98.7 F (37.1 C) 98.9 F (37.2 C)  TempSrc: Oral Oral Oral Oral  SpO2: 94% 95% 98% 95%  Weight:      Height:        Intake/Output Summary (Last 24 hours) at 09/08/2019 1001 Last data filed at 09/08/2019 0981 Gross per 24 hour  Intake 1064.84 ml  Output 1527 ml  Net -462.16 ml     Wt Readings from Last 3 Encounters:  09/03/19 81.9 kg  08/04/19 88.5 kg  12/22/15 93.9 kg     Exam  General: Alert and oriented x 3, NAD  Eyes:   HEENT:  Atraumatic, normocephalic, normal oropharynx  Cardiovascular: S1 S2 auscultated, no murmurs, RRR  Respiratory: Clear to auscultation bilaterally, no wheezing, rales  or rhonchi  Gastrointestinal: Soft, appropriately tender, incisions CDI, 3 drains+  Ext: no pedal edema bilaterally  Neuro: No new deficits  Musculoskeletal: No digital cyanosis, clubbing  Skin: No rashes  Psych: Normal affect and demeanor, alert and oriented x3    Data Reviewed:  I have personally reviewed following labs and imaging studies  Micro Results Recent Results (from the past 240 hour(s))  SARS CORONAVIRUS 2 (TAT 6-24 HRS) Nasopharyngeal Nasopharyngeal Swab     Status: None    Collection Time: 09/03/19 12:19 AM   Specimen: Nasopharyngeal Swab  Result Value Ref Range Status   SARS Coronavirus 2 NEGATIVE NEGATIVE Final    Comment: (NOTE) SARS-CoV-2 target nucleic acids are NOT DETECTED. The SARS-CoV-2 RNA is generally detectable in upper and lower respiratory specimens during the acute phase of infection. Negative results do not preclude SARS-CoV-2 infection, do not rule out co-infections with other pathogens, and should not be used as the sole basis for treatment or other patient management decisions. Negative results must be combined with clinical observations, patient history, and epidemiological information. The expected result is Negative. Fact Sheet for Patients: HairSlick.no Fact Sheet for Healthcare Providers: quierodirigir.com This test is not yet approved or cleared by the [place] FDA and  has been authorized for detection and/or diagnosis of SARS-CoV-2 by FDA under an Emergency Use Authorization (EUA). This EUA will remain  in effect (meaning this test can be used) for the duration of the COVID-19 declaration under Section 56 4(b)(1) of the Act, 21 U.S.C. section 360bbb-3(b)(1), unless the authorization is terminated or revoked sooner. Performed at [facility], [address], [place], [place] [address]   Surgical PCR screen     Status: None   Collection Time: 09/04/19  6:04 PM   Specimen: Nasal Mucosa; Nasal Swab  Result Value Ref Range Status   MRSA, PCR NEGATIVE NEGATIVE Final   Staphylococcus aureus NEGATIVE NEGATIVE Final    Comment: (NOTE) The Xpert SA Assay (FDA approved for NASAL specimens in patients 49 years of age and older), is one component of a comprehensive surveillance program. It is not intended to diagnose infection nor to guide or monitor treatment. Performed at [facility], [address] [place], [place] [address]   Culture, body  fluid-bottle     Status: None (Preliminary result)   Collection Time: 09/05/19  4:56 PM   Specimen: Peritoneal Washings  Result Value Ref Range Status   Specimen Description PERITONEAL  Final   Special Requests NONE  Final   Culture   Final    NO GROWTH 1 DAY Performed at [facility], [address], [place], [place] [address]    Report Status PENDING  Incomplete  Gram stain     Status: None   Collection Time: 09/05/19  4:56 PM   Specimen: Peritoneal Washings  Result Value Ref Range Status   Specimen Description PERITONEAL  Final   Special Requests NONE  Final   Gram Stain   Final    FEW WBC PRESENT,BOTH PMN AND MONONUCLEAR NO ORGANISMS SEEN Performed at [facility], [address] [place], [place] [address]    Report Status 09/06/2019 FINAL  Final    Radiology Reports Dg Cholangiogram Operative  Result Date: 09/06/2019 CLINICAL DATA:  Gallbladder wall thickening EXAM: INTRAOPERATIVE CHOLANGIOGRAM TECHNIQUE: Cholangiographic images from the C-arm fluoroscopic device were submitted for interpretation post-operatively. Please see the procedural report for the amount of contrast and the fluoroscopy time utilized. COMPARISON:  CT 09/03/2019 FINDINGS: No persistent filling defects in the common duct. Intrahepatic ducts are incompletely visualized, appearing decompressed centrally. Contrast passes into the duodenum. : Negative for retained common duct stone. Electronically Signed   By: Corlis Leak M.D.   On: 09/06/2019 05:45   Ct Angio Chest Pe W Or Wo Contrast  Result Date: 09/03/2019 CLINICAL DATA:  Abdominal pain, MRI suggesting omental caking. EXAM: CT CHEST CTA protocol of the chest and CT of the ABDOMEN, AND PELVIS WITH CONTRAST TECHNIQUE: Multidetector CT imaging of the chest utilizing CTA, PE protocol, and abdomen and pelvis was performed following the standard protocol during bolus administration of intravenous contrast. Maximum intensity projection reconstructions  were generated, submitted and were reviewed. CONTRAST:  92mL OMNIPAQUE IOHEXOL 350 MG/ML SOLN COMPARISON:  MRI 09/02/2019 prior CT assessments dating back to October of 2020 FINDINGS: CT CHEST FINDINGS Cardiovascular: Scattered aortic atherosclerosis. Mild dilation of ascending thoracic aorta 3.9 cm. Heart size is mildly enlarged without pericardial fluid. No signs of pulmonary embolism. Mediastinum/Nodes: No signs of mediastinal lymphadenopathy. Small amount of loculated fluid adjacent to a small hiatal hernia. Fluid is contiguous with hepatic gastric in left subdiaphragmatic fluid. No signs of adenopathy in the chest. Lungs/Pleura: Basilar collapse/consolidation bilaterally. Small bilateral pleural effusions. Airways are patent. Musculoskeletal: No signs of chest wall mass. No signs of acute bone finding or evidence of destructive bone process. No signs of chest wall mass. CT ABDOMEN PELVIS FINDINGS Hepatobiliary: Interval development of extensive perihepatic fluid with scalloping of the a Paddock margin. Fluid extends into the hepatic gastric recess, left subdiaphragmatic space along the right hemi liver and in the right pericolic gutter. Also tracking into the pelvis density between 15 and 20 Hounsfield units, nonspecific. Diffuse irregular gallbladder wall thickening. Gallbladder is less distended than on the previous exam. No focal mass lesion in the gallbladder. Pancreas: No signs of acute interstitial pancreatitis, ductal dilation or lesion. Spleen: Normal in size without focal abnormality. Adrenals/Urinary Tract: Normal adrenal glands. Symmetric enhancement of the bilateral kidneys. Stomach/Bowel: Small hiatal hernia. Signs of interloop fluid along the jejunum. Developing bowel distension since the MRI of 09/02/2019. Gradual transition in the mid abdomen but with decompressed distal small bowel loops. No signs of pneumatosis. Diffuse mesenteric edema. Vascular/Lymphatic: Calcified and noncalcified  atherosclerotic plaque in the abdominal aorta. No signs of aneurysm or acute abdominal vascular abnormality. The portal vein is patent. The splenic vein is patent. No signs of adenopathy in the upper abdomen or retroperitoneum. No signs of pelvic lymphadenopathy. Reproductive: Nonspecific appearance of the prostate by CT. Other: Perihepatic and upper abdominal fluid. Fluid along the left hepatic margin with scalloping of the left hemi liver measures 8 x 8 cm. Fluid in the left subdiaphragmatic area adjacent the spleen measures 14 by 4 cm. Right flank fluid 6.8 by 2.8 cm. Smaller pockets of fluid along the anterior surface of the liver. Diffuse omental stranding or nodularity. Interloop fluid adjacent to jejunal loops in the left abdomen 5.1 x 2.2 cm. No signs of free air. Musculoskeletal: No signs of acute bone finding or evidence of destructive bone process. IMPRESSION: 1. Interval development of extensive perihepatic fluid with scalloping of the hepatic margin, associated with diffuse ascites, some areas with more loculated appearance and with interloop fluid in the left hemiabdomen. Also associated with irregular omental and peritoneal stranding along with bowel edema. Constellation of findings could be seen in the setting of peritonitis, rapid development favors this alternative. Secondarily malignant ascites could also have this appearance. Diagnostic paracentesis  could be helpful. 2. Marked irregular gallbladder wall thickening, gallbladder is less distended than on the previous exam. Gallbladder compromise cannot be proven on the basis of this study though biliary peritonitis is certainly in the differential based on the appearance of the abdominal fluid. Acute on chronic cholecystitis with gallbladder compromise is considered. Based on gallbladder wall thickening neoplasm remains in the differential. 3. No signs of extraluminal contrast or other findings that would support bowel leak or perforated ulcer as a  cause for diffuse peritonitis. 4. No signs of pulmonary embolism. 5. Dense material in collapsed lung at the lung bases likely iodinated contrast, potentially aspirated by this patient with basilar airspace disease and small effusions. Aortic Atherosclerosis (ICD10-I70.0). Electronically Signed   By: Donzetta Kohut M.D.   On: 09/03/2019 18:05   Nm Hepatobiliary Liver Func  Result Date: 09/04/2019 CLINICAL DATA:  Right upper quadrant pain EXAM: NUCLEAR MEDICINE HEPATOBILIARY IMAGING TECHNIQUE: Sequential images of the abdomen were obtained out to 60 minutes following intravenous administration of radiopharmaceutical. RADIOPHARMACEUTICALS:  7.8 mCi Tc-14m  Choletec IV COMPARISON:  CT abdomen and pelvis September 03, 2019 FINDINGS: Prompt uptake and biliary excretion of activity by the liver is seen. Gallbladder activity is visualized, consistent with patency of cystic duct. Biliary activity passes into small bowel, consistent with patent common bile duct. IMPRESSION: Study within normal limits. Electronically Signed   By: Bretta Bang III M.D.   On: 09/04/2019 14:05   Nm Pulmonary Perfusion  Result Date: 09/03/2019 CLINICAL DATA:  Inpatient.  Elevated D-dimer.  Abdominal pain. EXAM: NUCLEAR MEDICINE PERFUSION LUNG SCAN TECHNIQUE: Perfusion images were obtained in multiple projections after intravenous injection of radiopharmaceutical. Ventilation scans were attempted but not tolerated by the patient (patient vomited after breathing apparatus placed). RADIOPHARMACEUTICALS:  1.5 mCi Tc-35m MAA IV COMPARISON:  Chest radiograph from earlier today. FINDINGS: There are multiple small moderate perfusion defects at posterior lung bases bilaterally, correlating with areas of airspace disease on the chest radiograph from earlier today. IMPRESSION: Perfusion-only scan, see comments. Multiple small to moderate perfusion defects at the posterior lung bases bilaterally with chest radiograph correlates. Findings are  considered intermediate probability for acute pulmonary embolism. Electronically Signed   By: Delbert Phenix M.D.   On: 09/03/2019 16:52   Ct Abdomen Pelvis W Contrast  Result Date: 09/03/2019 CLINICAL DATA:  Abdominal pain, MRI suggesting omental caking. EXAM: CT CHEST CTA protocol of the chest and CT of the ABDOMEN, AND PELVIS WITH CONTRAST TECHNIQUE: Multidetector CT imaging of the chest utilizing CTA, PE protocol, and abdomen and pelvis was performed following the standard protocol during bolus administration of intravenous contrast. Maximum intensity projection reconstructions were generated, submitted and were reviewed. CONTRAST:  80mL OMNIPAQUE IOHEXOL 350 MG/ML SOLN COMPARISON:  MRI 09/02/2019 prior CT assessments dating back to October of 2020 FINDINGS: CT CHEST FINDINGS Cardiovascular: Scattered aortic atherosclerosis. Mild dilation of ascending thoracic aorta 3.9 cm. Heart size is mildly enlarged without pericardial fluid. No signs of pulmonary embolism. Mediastinum/Nodes: No signs of mediastinal lymphadenopathy. Small amount of loculated fluid adjacent to a small hiatal hernia. Fluid is contiguous with hepatic gastric in left subdiaphragmatic fluid. No signs of adenopathy in the chest. Lungs/Pleura: Basilar collapse/consolidation bilaterally. Small bilateral pleural effusions. Airways are patent. Musculoskeletal: No signs of chest wall mass. No signs of acute bone finding or evidence of destructive bone process. No signs of chest wall mass. CT ABDOMEN PELVIS FINDINGS Hepatobiliary: Interval development of extensive perihepatic fluid with scalloping of the a Paddock margin. Fluid extends  into the hepatic gastric recess, left subdiaphragmatic space along the right hemi liver and in the right pericolic gutter. Also tracking into the pelvis density between 15 and 20 Hounsfield units, nonspecific. Diffuse irregular gallbladder wall thickening. Gallbladder is less distended than on the previous exam. No  focal mass lesion in the gallbladder. Pancreas: No signs of acute interstitial pancreatitis, ductal dilation or lesion. Spleen: Normal in size without focal abnormality. Adrenals/Urinary Tract: Normal adrenal glands. Symmetric enhancement of the bilateral kidneys. Stomach/Bowel: Small hiatal hernia. Signs of interloop fluid along the jejunum. Developing bowel distension since the MRI of 09/02/2019. Gradual transition in the mid abdomen but with decompressed distal small bowel loops. No signs of pneumatosis. Diffuse mesenteric edema. Vascular/Lymphatic: Calcified and noncalcified atherosclerotic plaque in the abdominal aorta. No signs of aneurysm or acute abdominal vascular abnormality. The portal vein is patent. The splenic vein is patent. No signs of adenopathy in the upper abdomen or retroperitoneum. No signs of pelvic lymphadenopathy. Reproductive: Nonspecific appearance of the prostate by CT. Other: Perihepatic and upper abdominal fluid. Fluid along the left hepatic margin with scalloping of the left hemi liver measures 8 x 8 cm. Fluid in the left subdiaphragmatic area adjacent the spleen measures 14 by 4 cm. Right flank fluid 6.8 by 2.8 cm. Smaller pockets of fluid along the anterior surface of the liver. Diffuse omental stranding or nodularity. Interloop fluid adjacent to jejunal loops in the left abdomen 5.1 x 2.2 cm. No signs of free air. Musculoskeletal: No signs of acute bone finding or evidence of destructive bone process. IMPRESSION: 1. Interval development of extensive perihepatic fluid with scalloping of the hepatic margin, associated with diffuse ascites, some areas with more loculated appearance and with interloop fluid in the left hemiabdomen. Also associated with irregular omental and peritoneal stranding along with bowel edema. Constellation of findings could be seen in the setting of peritonitis, rapid development favors this alternative. Secondarily malignant ascites could also have this  appearance. Diagnostic paracentesis could be helpful. 2. Marked irregular gallbladder wall thickening, gallbladder is less distended than on the previous exam. Gallbladder compromise cannot be proven on the basis of this study though biliary peritonitis is certainly in the differential based on the appearance of the abdominal fluid. Acute on chronic cholecystitis with gallbladder compromise is considered. Based on gallbladder wall thickening neoplasm remains in the differential. 3. No signs of extraluminal contrast or other findings that would support bowel leak or perforated ulcer as a cause for diffuse peritonitis. 4. No signs of pulmonary embolism. 5. Dense material in collapsed lung at the lung bases likely iodinated contrast, potentially aspirated by this patient with basilar airspace disease and small effusions. Aortic Atherosclerosis (ICD10-I70.0). Electronically Signed   By: Donzetta Kohut M.D.   On: 09/03/2019 18:05   Mr 3d Recon At Scanner  Result Date: 09/02/2019 CLINICAL DATA:  Patient with possible pancreatitis. Gallbladder wall thickening on prior right upper quadrant ultrasound. EXAM: MRI ABDOMEN WITHOUT AND WITH CONTRAST (INCLUDING MRCP) TECHNIQUE: Multiplanar multisequence MR imaging of the abdomen was performed both before and after the administration of intravenous contrast. Heavily T2-weighted images of the biliary and pancreatic ducts were obtained, and three-dimensional MRCP images were rendered by post processing. CONTRAST:  9mL GADAVIST GADOBUTROL 1 MMOL/ML IV SOLN COMPARISON:  Ultrasound abdomen 09/01/2009; CT abdomen pelvis 08/04/2019 FINDINGS: Lower chest: Small bilateral pleural effusions. Consolidative opacities within the lower lungs bilaterally. Hepatobiliary: Liver is normal in size and contour. There is irregular wall thickening of the gallbladder. No cholelithiasis. Possible filling  defect within the distal common bile duct on MRCP images (image 30; series 7). No intrahepatic  or extrahepatic biliary ductal dilatation. Pancreas:  Unremarkable Spleen:  Unremarkable Adrenals/Urinary Tract: Normal adrenal glands. Kidneys enhance symmetrically with contrast. No hydronephrosis. Stomach/Bowel: Moderate sized hiatal hernia. Otherwise normal morphology of the stomach. No evidence for small bowel obstruction. Vascular/Lymphatic: Normal caliber abdominal aorta. No retroperitoneal lymphadenopathy. Other: There is ascites within the left upper quadrant. Additionally there is perihepatic fluid which appears loculated along the anterior hepatic margin (image 13; series 5). There is patchy enhancement scattered throughout the omentum and colonic mesentery within the central upper abdomen (image 57; series 1202). Musculoskeletal: No aggressive or acute appearing osseous lesions. IMPRESSION: 1. Persistent irregular wall thickening of the gallbladder which is nonspecific in etiology. Findings may be secondary to acute cholecystitis in the appropriate clinical setting. The possibility of gallbladder malignancy is not excluded. 2. There appears to be loculated ascites within the left upper quadrant and within the perihepatic locations, nonspecific in etiology. Additionally, there is patchy enhancement involving the omentum and upper abdominal colonic mesentery. While these findings may be secondary to an acute infectious/inflammatory process, the possibility of omental carcinomatosis and/or malignant ascites is not entirely excluded, potentially if the gallbladder findings are secondary to a malignancy. 3. Given the multitude of findings, many of which have changed or progressed from prior CT, recommend further evaluation with contrast enhanced CT of the abdomen and pelvis. 4. There is narrowing of the distal aspect of the common bile duct on MRCP images which is likely artifactual in etiology. The possibility of a small distal CBD stone is not entirely excluded. Electronically Signed   By: Annia Beltrew  Davis M.D.    On: 09/02/2019 20:58   Dg Chest Port 1 View  Result Date: 09/03/2019 CLINICAL DATA:  Hypoxia. EXAM: PORTABLE CHEST 1 VIEW COMPARISON:  One-view chest x-ray 08/08/2019 FINDINGS: Heart size is exaggerated by low lung volumes. Moderate pulmonary vascular congestion is present. Bilateral pleural effusions and basilar airspace opacities are noted. The visualized soft tissues and bony thorax are unremarkable. IMPRESSION: 1. Stable appearance of bilateral pleural effusions and basilar airspace disease. While this likely reflects atelectasis, infection is not excluded. 2. Moderate pulmonary vascular congestion. Electronically Signed   By: Marin Robertshristopher  Mattern M.D.   On: 09/03/2019 05:01   Dg Roselle LocusUgi W Single Cm (sol Or Thin Ba)  Result Date: 08/10/2019 CLINICAL DATA:  Severe right upper quadrant pain and nausea. EXAM: UPPER GI SERIES WITH KUB TECHNIQUE: After obtaining a scout radiograph a routine upper GI series was performed using thin barium. FLUOROSCOPY TIME:  Fluoroscopy Time:  2 minutes 42 seconds Radiation Exposure Index (if provided by the fluoroscopic device): 54.9 Number of Acquired Spot Images: 0 COMPARISON:  None. FINDINGS: Scout radiograph:  Unremarkable bowel gas pattern. Esophagus: No evidence of esophageal mass or stricture. Mild esophageal dysmotility is seen with break up of primary peristalsis in the upper to mid thoracic esophagus. Gastroesophageal reflux was seen to the level of the midthoracic esophagus. Stomach: A small sliding hiatal hernia is seen. No evidence of gastric mass or ulcer. Duodenum: No ulcer or other significant abnormality seen involving duodenal bulb or sweep. Other:  None. IMPRESSION: Small sliding hiatal hernia and gastroesophageal reflux. No evidence of mass or stricture. Mild esophageal dysmotility. Electronically Signed   By: Danae OrleansJohn A Stahl M.D.   On: 08/10/2019 10:37   Mr Abdomen Mrcp Vivien RossettiW Wo Contast  Result Date: 09/02/2019 CLINICAL DATA:  Patient with possible  pancreatitis. Gallbladder wall  thickening on prior right upper quadrant ultrasound. EXAM: MRI ABDOMEN WITHOUT AND WITH CONTRAST (INCLUDING MRCP) TECHNIQUE: Multiplanar multisequence MR imaging of the abdomen was performed both before and after the administration of intravenous contrast. Heavily T2-weighted images of the biliary and pancreatic ducts were obtained, and three-dimensional MRCP images were rendered by post processing. CONTRAST:  9mL GADAVIST GADOBUTROL 1 MMOL/ML IV SOLN COMPARISON:  Ultrasound abdomen 09/01/2009; CT abdomen pelvis 08/04/2019 FINDINGS: Lower chest: Small bilateral pleural effusions. Consolidative opacities within the lower lungs bilaterally. Hepatobiliary: Liver is normal in size and contour. There is irregular wall thickening of the gallbladder. No cholelithiasis. Possible filling defect within the distal common bile duct on MRCP images (image 30; series 7). No intrahepatic or extrahepatic biliary ductal dilatation. Pancreas:  Unremarkable Spleen:  Unremarkable Adrenals/Urinary Tract: Normal adrenal glands. Kidneys enhance symmetrically with contrast. No hydronephrosis. Stomach/Bowel: Moderate sized hiatal hernia. Otherwise normal morphology of the stomach. No evidence for small bowel obstruction. Vascular/Lymphatic: Normal caliber abdominal aorta. No retroperitoneal lymphadenopathy. Other: There is ascites within the left upper quadrant. Additionally there is perihepatic fluid which appears loculated along the anterior hepatic margin (image 13; series 5). There is patchy enhancement scattered throughout the omentum and colonic mesentery within the central upper abdomen (image 57; series 1202). Musculoskeletal: No aggressive or acute appearing osseous lesions. IMPRESSION: 1. Persistent irregular wall thickening of the gallbladder which is nonspecific in etiology. Findings may be secondary to acute cholecystitis in the appropriate clinical setting. The possibility of gallbladder  malignancy is not excluded. 2. There appears to be loculated ascites within the left upper quadrant and within the perihepatic locations, nonspecific in etiology. Additionally, there is patchy enhancement involving the omentum and upper abdominal colonic mesentery. While these findings may be secondary to an acute infectious/inflammatory process, the possibility of omental carcinomatosis and/or malignant ascites is not entirely excluded, potentially if the gallbladder findings are secondary to a malignancy. 3. Given the multitude of findings, many of which have changed or progressed from prior CT, recommend further evaluation with contrast enhanced CT of the abdomen and pelvis. 4. There is narrowing of the distal aspect of the common bile duct on MRCP images which is likely artifactual in etiology. The possibility of a small distal CBD stone is not entirely excluded. Electronically Signed   By: Annia Belt M.D.   On: 09/02/2019 20:58   US Abdomen Limited Ruq  Result Date: 09/02/2019 CLINICAL DATA:  63 year old male with history of nausea, vomiting and abdominal pain. Under in both to take deep breaths. EXAM: ULTRASOUND ABDOMEN LIMITED RIGHT UPPER QUADRANT COMPARISON:  None. FINDINGS: Gallbladder: No gallstones. Gallbladder wall appears mildly thickened ranging from 5-7 mm in thickness. No pericholecystic fluid. No sonographic Murphy sign noted by sonographer. Common bile duct: Diameter: 3 mm Liver: No focal lesion identified. Within normal limits in parenchymal echogenicity. Portal vein is patent on color Doppler imaging with normal direction of blood flow towards the liver. Other: None. IMPRESSION: 1. Gallbladder wall appears mildly thickened. This is of uncertain etiology and significance. No gallstones or findings to suggest an acute cholecystitis are noted at this time. Follow-up right upper quadrant abdominal ultrasound is suggested in 2 weeks to ensure resolution of this finding. Should this finding not  resolve, further evaluation with abdominal MRI with and without IV gadolinium with MRCP would be suggested to exclude the possibility of gallbladder malignancy. Electronically Signed   By: Trudie Reed M.D.   On: 09/02/2019 13:13    Lab Data:  CBC: Recent Labs  Lab 09/03/19 0304 09/04/19 0127 09/05/19 0326 09/06/19 0351 09/07/19 0341  WBC 9.8 7.4 9.6 10.7* 10.5  NEUTROABS  --   --  7.8* 9.4*  --   HGB 16.4 14.4 12.8* 11.9* 11.3*  HCT 49.6 44.9 39.7 38.0* 35.4*  MCV 92.5 94.7 95.4 96.9 95.4  PLT 406* 357 332 318 281   Basic Metabolic Panel: Recent Labs  Lab 09/03/19 0304 09/03/19 1220 09/04/19 0127 09/05/19 0326 09/06/19 0351 09/07/19 0341  NA  --  137 134* 136 138 139  K  --  4.6 4.7 4.2 4.6 3.8  CL  --  108 104 105 103 103  CO2  --  20* 20* GLUCOSE  --  154* 130* 128* 143* 121*  BUN  --  47* 47* 31* 25* 19  CREATININE  --  1.68* 1.58* 1.09 0.93 0.77  CALCIUM  --  8.2* 8.1* 8.0* 7.8* 7.9*  MG 2.2  --  2.4  --   --   --   PHOS 3.4  --   --   --   --   --    GFR: Estimated Creatinine Clearance: 98.9 mL/min (by C-G formula based on SCr of 0.77 mg/dL). Liver Function Tests: Recent Labs  Lab 09/02/19 1127 09/04/19 0127 09/05/19 0326 09/06/19 0351  AST 17 18 12* 36  ALT ALKPHOS 43 38 33* 38  BILITOT 1.3* 2.0* 1.4* 1.0  PROT 7.5 5.6* 5.4* 5.1*  ALBUMIN 4.0 2.7* 2.5* 2.1*   Recent Labs  Lab 09/02/19 1127  LIPASE 55*   No results for input(s): AMMONIA in the last 168 hours. Coagulation Profile: No results for input(s): INR, PROTIME in the last 168 hours. Cardiac Enzymes: No results for input(s): CKTOTAL, CKMB, CKMBINDEX, TROPONINI in the last 168 hours. BNP (last 3 results) No results for input(s): PROBNP in the last 8760 hours. HbA1C: No results for input(s): HGBA1C in the last 72 hours. CBG: Recent Labs  Lab 09/07/19 1551 09/07/19 2028 09/08/19 0041 09/08/19 0431 09/08/19 0747  GLUCAP 105* 104* 97 96 94   Lipid  Profile: No results for input(s): CHOL, HDL, LDLCALC, TRIG, CHOLHDL, LDLDIRECT in the last 72 hours. Thyroid Function Tests: No results for input(s): TSH, T4TOTAL, FREET4, T3FREE, THYROIDAB in the last 72 hours. Anemia Panel: No results for input(s): VITAMINB12, FOLATE, FERRITIN, TIBC, IRON, RETICCTPCT in the last 72 hours. Urine analysis:    Component Value Date/Time   COLORURINE AMBER (A) 09/02/2019 1631   APPEARANCEUR CLOUDY (A) 09/02/2019 1631   LABSPEC 1.029 09/02/2019 1631   PHURINE 5.0 09/02/2019 1631   GLUCOSEU 50 (A) 09/02/2019 1631   HGBUR NEGATIVE 09/02/2019 1631   BILIRUBINUR SMALL (A) 09/02/2019 1631   KETONESUR 5 (A) 09/02/2019 1631   PROTEINUR 30 (A) 09/02/2019 1631   UROBILINOGEN 0.2 04/01/2015 1155   NITRITE NEGATIVE 09/02/2019 1631   LEUKOCYTESUR NEGATIVE 09/02/2019 1631     Sohana Austell M.D. [facility] 09/08/2019, 10:01 AM   Call night coverage person covering after 7pm

## 2019-09-08 NOTE — Plan of Care (Addendum)
Pt experiencing diarrhea 5-6 X a day. He is decreasing his PO intake D/T fears of diarrhea. On-call contacted. New order for a one-time dose of 4 mg of Loperamide.  Also, Pt's WBC's increased from 10.5 on 12/4 to 15.9 on 12/6.

## 2019-09-09 LAB — CBC
HCT: 37.7 % — ABNORMAL LOW (ref 39.0–52.0)
Hemoglobin: 12.2 g/dL — ABNORMAL LOW (ref 13.0–17.0)
MCH: 30.3 pg (ref 26.0–34.0)
MCHC: 32.4 g/dL (ref 30.0–36.0)
MCV: 93.8 fL (ref 80.0–100.0)
Platelets: 316 10*3/uL (ref 150–400)
RBC: 4.02 MIL/uL — ABNORMAL LOW (ref 4.22–5.81)
RDW: 12.3 % (ref 11.5–15.5)
WBC: 15.9 10*3/uL — ABNORMAL HIGH (ref 4.0–10.5)
nRBC: 0 % (ref 0.0–0.2)

## 2019-09-09 LAB — BASIC METABOLIC PANEL
Anion gap: 12 (ref 5–15)
BUN: 12 mg/dL (ref 8–23)
CO2: 26 mmol/L (ref 22–32)
Calcium: 8.2 mg/dL — ABNORMAL LOW (ref 8.9–10.3)
Chloride: 99 mmol/L (ref 98–111)
Creatinine, Ser: 0.86 mg/dL (ref 0.61–1.24)
GFR calc Af Amer: >60 mL/min (ref >60)
GFR calc non Af Amer: >60 mL/min (ref >60)
Glucose, Bld: 102 mg/dL — ABNORMAL HIGH (ref 70–99)
Potassium: 3.6 mmol/L (ref 3.5–5.1)
Sodium: 137 mmol/L (ref 135–145)

## 2019-09-09 MED ORDER — LOPERAMIDE HCL 2 MG PO CAPS: 2.0000 mg | ORAL_CAPSULE | ORAL | Status: DC | PRN

## 2019-09-09 NOTE — Progress Notes (Signed)
[facility]                                                                              Patient Demographics  Corey Logan, is a 63 y.o. male, DOB - [birth year], ZOX:[id number]  Admit date - 09/02/2019   Admitting Physician Therisa Doyne, MD  Outpatient Primary MD for the patient is Elias Else, MD  Outpatient specialists:   LOS - 6  days   Medical records reviewed and are as summarized below:    Chief Complaint  Patient presents with   Abdominal Pain       Brief summary   63 y.o.malewith medical history significant of arthritis, depression, GERD, hiatal hernia, hypertensionalcohol dependence presented to ED on 11/29 with nausea, vomiting, right upper quadrant pain after he ate eggs and steak radiating to his right and left shoulder.States that he quit alcohol and have not had any for the past 1 month. Patient was recently admitted in October with RUQ pain- work-up with CT scan showed gallbladder thickening, USG showed no evidence of gallstones or significant GB wall/ CBD abnormality, HIDA scan showed normal biliary patency and normal ejection fraction. GS evaluated patient and felt that his symptoms were not related to his gallbladder disease-recommended GI eval for EGD. GI series showed small sliding hiatal hernia and GERD, mild esophageal dysmotility.While hospitalized patient developed acute alcohol withdrawal syndrome /encephalopathy with w/u including EEG/CT head.   Hospital course: Patient admitted to [facility] with medical supportive management and GS/GI re-consulted. MRCP reports distal CBD stone vs artifact, GB wall Irregularity,loculated ascites within the left upper quadrant and within the perihepatic locations, nonspecific in etiology. Additionally, patchy enhancement involving the omentum and upper abdominal colonic mesentery-concerning for malignancy-Repeat CT A/P per GS along with CA 19-9, AFP, CA 125 sent. COVID negative-D dimer 3.71.Started on  CLD. HC complicated by persistent nausea/vomiting, AKI requiring IVF and acute hypoxemic respiratory failure-Noted effusions/atelectasis on CXR- CT chest -ve for PE. Repeat HIDA wnl on 12/1. Given persistent symptoms, taken to OR on 12/2. Intraoperative findings consistent with biliary ascites possibly gallbladder source-given gallbladder thickening and possibility of chronic cholecystitis, cholecystectomy was performed as well.  After abdominal irrigation, 3 drains placed.GS following. Awaiting clearance from surgery for discharge.  COVID-19 test negative  Assessment & Plan     Abdominal pain with nausea vomiting secondary to chronic cholecystitis, bilious ascites with saponification of the omental fat -MRCP showed significant findings of gallbladder wall irregularity, omental thickening and ascites concerning for infectious versus malignancy versus biliary peritonitis -Repeat HIDA scan within normal limits -General surgery was consulted, underwent laparoscopic evaluation of bilious ascites, intraoperative cholangiogram (unremarkable), cholecystectomy.  - Cytology showed reactive mesothelial cells.  Pathology consistent with chronic cholecystitis, left colon epiploicae appendices pathology consistent with benign fibroadipose tissue -EGD was unremarkable. -Tumor markers unremarkable CA 19-9 8.0, CA-125 61, AFP 1.1. -Yesterday had multiple loose BMs however per patient around 8 PM it became solid.  No fevers or worsening abdominal pain or tenderness.  WBCs trending up, will monitor closely -Per surgery, Dr. Cliffton Asters, continue the drains today until tolerating solid diet, and then will take out the drains, no cause of bile  peritonitis found.  Will obtain blood cultures if started spiking any fevers.  Acute respiratory failure with hypoxia -Likely due to pleural effusions/atelectasis, BNP 41 -Preoperatively requiring 2 - 3 L of O2 [place], postoperatively much improved, currently on room air -O2 sats  currently 93% on room air, continue incentive spirometry, pulmonary toilet   Acute kidney injury -Creatinine 0.84 on 08/11/19, creatinine during this admission peaked to 1.68, possibly ATN versus prerenal due to intractable nausea and vomiting -Creatinine stable, 0.8  History of alcohol dependence with recent episode of alcohol withdrawal -Patient reports abstinence for at least a month now, not in any withdrawals Continue multivitamin, thiamine  GERD/hiatal hernia with esophageal dysmotility -Underwent EGD last admission, continue PPI  Mild normocytic anemia -Hemoglobin 18.0 at the time of admission, likely hemoconcentrated due to dehydration -H&H currently stable, improving from yesterday, 12.2  Elevated BP readings BP stable, continue IV hydralazine as needed with parameters.  Outpatient not on any antihypertensives.    Arthritis Avoid NSAIDs   Code Status: Full code DVT Prophylaxis: Heparin subcu Family Communication: Discussed all imaging results, lab results, explained to the patient    Disposition Plan: Awaiting clearance from surgery regarding disposition/discharge  Time Spent in minutes 35 minutes  Procedures:  09/05/2019 Diagnostic LAPAROSCOPY, evacuation of bilious ascites (90 minutes for diagnostic lap and evacuation of bilious ascites), UPPER Endoscopy, LAPAROSCOPIC CHOLECYSTECTOMY WITH INTRAOPERATIVE CHOLANGIOGRAM, biopsy of left colon appendices epiploicae [photos at the end of the dictation]    Consultants:   Gastroenterology General surgery  Antimicrobials:   Anti-infectives (From admission, onward)   Start     Dose/Rate Route Frequency Ordered Stop   09/04/19 0000  piperacillin-tazobactam (ZOSYN) IVPB 3.375 g     3.375 g 12.5 mL/hr over 240 Minutes Intravenous Every 8 hours 09/03/19 1828     09/03/19 1830  piperacillin-tazobactam (ZOSYN) IVPB 3.375 g     3.375 g 100 mL/hr over 30 Minutes Intravenous STAT 09/03/19 1828 09/03/19 1916          Medications  Scheduled Meds:  heparin injection (subcutaneous)  5,000 Units Subcutaneous Q8H   multivitamin with minerals  1 tablet Oral Daily   pantoprazole  40 mg Oral Daily   thiamine  100 mg Oral Daily   Continuous Infusions:  piperacillin-tazobactam (ZOSYN)  IV 3.375 g (09/09/19 0902)   PRN Meds:.acetaminophen **OR** acetaminophen, hydrALAZINE, HYDROmorphone (DILAUDID) injection, labetalol, loperamide, LORazepam, ondansetron **OR** ondansetron (ZOFRAN) IV, oxyCODONE      Subjective:   Phyllis Whitefield was seen and examined today.  States had multiple episodes of loose BM yesterday but around 8 PM became solid.  No fevers or chills.  No worsening of abdominal pain.  Eating only bites of regular meal.  No active nausea or vomiting.  Patient denies dizziness, chest pain, shortness of breath, new weakness, numbess, tingling. No acute events overnight.    Objective:   Vitals:   09/08/19 0036 09/08/19 1447 09/08/19 2035 09/09/19 0520  BP: (!) 156/86 (!) 150/83 (!) 144/85 122/79  Pulse: 84 84 87 83  Resp: 20 14 20 18   Temp: 98.9 F (37.2 C) 99.6 F (37.6 C) 99.2 F (37.3 C) 98.4 F (36.9 C)  TempSrc: Oral Oral Oral Oral  SpO2: 95% 97% 98% 93%  Weight:      Height:        Intake/Output Summary (Last 24 hours) at 09/09/2019 0957 Last data filed at 09/09/2019 0900 Gross per 24 hour  Intake 177.42 ml  Output 1175 ml  Net -997.58 ml  Wt Readings from Last 3 Encounters:  09/03/19 81.9 kg  08/04/19 88.5 kg  12/22/15 93.9 kg   Physical Exam  General: Alert and oriented x 3, NAD  Eyes:  HEENT:    Cardiovascular: S1 S2 clear, RRR. No pedal edema b/l  Respiratory: CTAB, no wheezing, rales or rhonchi  Gastrointestinal: Soft, appropriately tender, incisions CDI, 3 drains, serosanguineous output  Ext: no pedal edema bilaterally  Neuro: no new deficits  Musculoskeletal: No cyanosis, clubbing  Skin: No rashes  Psych: Normal affect and demeanor,  alert and oriented x3     Data Reviewed:  I have personally reviewed following labs and imaging studies  Micro Results Recent Results (from the past 240 hour(s))  SARS CORONAVIRUS 2 (TAT 6-24 HRS) Nasopharyngeal Nasopharyngeal Swab     Status: None   Collection Time: 09/03/19 12:19 AM   Specimen: Nasopharyngeal Swab  Result Value Ref Range Status   SARS Coronavirus 2 NEGATIVE NEGATIVE Final    Comment: (NOTE) SARS-CoV-2 target nucleic acids are NOT DETECTED. The SARS-CoV-2 RNA is generally detectable in upper and lower respiratory specimens during the acute phase of infection. Negative results do not preclude SARS-CoV-2 infection, do not rule out co-infections with other pathogens, and should not be used as the sole basis for treatment or other patient management decisions. Negative results must be combined with clinical observations, patient history, and epidemiological information. The expected result is Negative. Fact Sheet for Patients: HairSlick.no Fact Sheet for Healthcare Providers: quierodirigir.com This test is not yet approved or cleared by the [place] FDA and  has been authorized for detection and/or diagnosis of SARS-CoV-2 by FDA under an Emergency Use Authorization (EUA). This EUA will remain  in effect (meaning this test can be used) for the duration of the COVID-19 declaration under Section 56 4(b)(1) of the Act, 21 U.S.C. section 360bbb-3(b)(1), unless the authorization is terminated or revoked sooner. Performed at [facility], [address] [place], [place] [address]   Surgical PCR screen     Status: None   Collection Time: 09/04/19  6:04 PM   Specimen: Nasal Mucosa; Nasal Swab  Result Value Ref Range Status   MRSA, PCR NEGATIVE NEGATIVE Final   Staphylococcus aureus NEGATIVE NEGATIVE Final    Comment: (NOTE) The Xpert SA Assay (FDA approved for NASAL specimens in patients 22 years of  age and older), is one component of a comprehensive surveillance program. It is not intended to diagnose infection nor to guide or monitor treatment. Performed at [facility], [address] [place], [place] [address]   Fungus Stain     Status: None   Collection Time: 09/05/19  4:56 PM   Specimen: PATH Cytology Peritoneal fluid; Body Fluid  Result Value Ref Range Status   FUNGUS STAIN Final report  Final    Comment: (NOTE) Performed At: [facility] [address] [place], [place] [id number] Jolene Schimke MD NU:[id number]    Fungal Source PERITONEAL  Final    Comment: Performed at [facility], [address], [place], [place] [address]  Culture, body fluid-bottle     Status: None (Preliminary result)   Collection Time: 09/05/19  4:56 PM   Specimen: Peritoneal Washings  Result Value Ref Range Status   Specimen Description PERITONEAL  Final   Special Requests NONE  Final   Culture   Final    NO GROWTH 3 DAYS Performed at [facility], [address] [place], [place] [address]  Report Status PENDING  Incomplete  Gram stain     Status: None   Collection Time: 09/05/19  4:56 PM   Specimen: Peritoneal Washings  Result Value Ref Range Status   Specimen Description PERITONEAL  Final   Special Requests NONE  Final   Gram Stain   Final    FEW WBC PRESENT,BOTH PMN AND MONONUCLEAR NO ORGANISMS SEEN Performed at [facility], [address], [place], [place] [address]    Report Status 09/06/2019 FINAL  Final  Fungal Stain reflex     Status: None   Collection Time: 09/05/19  4:56 PM  Result Value Ref Range Status   Fungal stain result 1 Comment  Final    Comment: (NOTE) KOH/Calcofluor preparation:  no fungus observed. Performed At: [facility] [address] [place], [place] [id number] Jolene Schimke MD HQ:[id number]     Radiology Reports Dg Cholangiogram Operative  Result Date: 09/06/2019 CLINICAL  DATA:  Gallbladder wall thickening EXAM: INTRAOPERATIVE CHOLANGIOGRAM TECHNIQUE: Cholangiographic images from the C-arm fluoroscopic device were submitted for interpretation post-operatively. Please see the procedural report for the amount of contrast and the fluoroscopy time utilized. COMPARISON:  CT 09/03/2019 FINDINGS: No persistent filling defects in the common duct. Intrahepatic ducts are incompletely visualized, appearing decompressed centrally. Contrast passes into the duodenum. : Negative for retained common duct stone. Electronically Signed   By: Corlis Leak M.D.   On: 09/06/2019 05:45   Ct Angio Chest Pe W Or Wo Contrast  Result Date: 09/03/2019 CLINICAL DATA:  Abdominal pain, MRI suggesting omental caking. EXAM: CT CHEST CTA protocol of the chest and CT of the ABDOMEN, AND PELVIS WITH CONTRAST TECHNIQUE: Multidetector CT imaging of the chest utilizing CTA, PE protocol, and abdomen and pelvis was performed following the standard protocol during bolus administration of intravenous contrast. Maximum intensity projection reconstructions were generated, submitted and were reviewed. CONTRAST:  80mL OMNIPAQUE IOHEXOL 350 MG/ML SOLN COMPARISON:  MRI 09/02/2019 prior CT assessments dating back to October of 2020 FINDINGS: CT CHEST FINDINGS Cardiovascular: Scattered aortic atherosclerosis. Mild dilation of ascending thoracic aorta 3.9 cm. Heart size is mildly enlarged without pericardial fluid. No signs of pulmonary embolism. Mediastinum/Nodes: No signs of mediastinal lymphadenopathy. Small amount of loculated fluid adjacent to a small hiatal hernia. Fluid is contiguous with hepatic gastric in left subdiaphragmatic fluid. No signs of adenopathy in the chest. Lungs/Pleura: Basilar collapse/consolidation bilaterally. Small bilateral pleural effusions. Airways are patent. Musculoskeletal: No signs of chest wall mass. No signs of acute bone finding or evidence of destructive bone process. No signs of chest wall  mass. CT ABDOMEN PELVIS FINDINGS Hepatobiliary: Interval development of extensive perihepatic fluid with scalloping of the a Paddock margin. Fluid extends into the hepatic gastric recess, left subdiaphragmatic space along the right hemi liver and in the right pericolic gutter. Also tracking into the pelvis density between 15 and 20 Hounsfield units, nonspecific. Diffuse irregular gallbladder wall thickening. Gallbladder is less distended than on the previous exam. No focal mass lesion in the gallbladder. Pancreas: No signs of acute interstitial pancreatitis, ductal dilation or lesion. Spleen: Normal in size without focal abnormality. Adrenals/Urinary Tract: Normal adrenal glands. Symmetric enhancement of the bilateral kidneys. Stomach/Bowel: Small hiatal hernia. Signs of interloop fluid along the jejunum. Developing bowel distension since the MRI of 09/02/2019. Gradual transition in the mid abdomen but with decompressed distal small bowel loops. No signs of pneumatosis. Diffuse mesenteric edema. Vascular/Lymphatic: Calcified and noncalcified atherosclerotic plaque in the abdominal aorta. No signs of aneurysm or acute  abdominal vascular abnormality. The portal vein is patent. The splenic vein is patent. No signs of adenopathy in the upper abdomen or retroperitoneum. No signs of pelvic lymphadenopathy. Reproductive: Nonspecific appearance of the prostate by CT. Other: Perihepatic and upper abdominal fluid. Fluid along the left hepatic margin with scalloping of the left hemi liver measures 8 x 8 cm. Fluid in the left subdiaphragmatic area adjacent the spleen measures 14 by 4 cm. Right flank fluid 6.8 by 2.8 cm. Smaller pockets of fluid along the anterior surface of the liver. Diffuse omental stranding or nodularity. Interloop fluid adjacent to jejunal loops in the left abdomen 5.1 x 2.2 cm. No signs of free air. Musculoskeletal: No signs of acute bone finding or evidence of destructive bone process. IMPRESSION: 1.  Interval development of extensive perihepatic fluid with scalloping of the hepatic margin, associated with diffuse ascites, some areas with more loculated appearance and with interloop fluid in the left hemiabdomen. Also associated with irregular omental and peritoneal stranding along with bowel edema. Constellation of findings could be seen in the setting of peritonitis, rapid development favors this alternative. Secondarily malignant ascites could also have this appearance. Diagnostic paracentesis could be helpful. 2. Marked irregular gallbladder wall thickening, gallbladder is less distended than on the previous exam. Gallbladder compromise cannot be proven on the basis of this study though biliary peritonitis is certainly in the differential based on the appearance of the abdominal fluid. Acute on chronic cholecystitis with gallbladder compromise is considered. Based on gallbladder wall thickening neoplasm remains in the differential. 3. No signs of extraluminal contrast or other findings that would support bowel leak or perforated ulcer as a cause for diffuse peritonitis. 4. No signs of pulmonary embolism. 5. Dense material in collapsed lung at the lung bases likely iodinated contrast, potentially aspirated by this patient with basilar airspace disease and small effusions. Aortic Atherosclerosis (ICD10-I70.0). Electronically Signed   By: Donzetta Kohut M.D.   On: 09/03/2019 18:05   Nm Hepatobiliary Liver Func  Result Date: 09/04/2019 CLINICAL DATA:  Right upper quadrant pain EXAM: NUCLEAR MEDICINE HEPATOBILIARY IMAGING TECHNIQUE: Sequential images of the abdomen were obtained out to 60 minutes following intravenous administration of radiopharmaceutical. RADIOPHARMACEUTICALS:  7.8 mCi Tc-22m  Choletec IV COMPARISON:  CT abdomen and pelvis September 03, 2019 FINDINGS: Prompt uptake and biliary excretion of activity by the liver is seen. Gallbladder activity is visualized, consistent with patency of cystic  duct. Biliary activity passes into small bowel, consistent with patent common bile duct. IMPRESSION: Study within normal limits. Electronically Signed   By: Bretta Bang III M.D.   On: 09/04/2019 14:05   Nm Pulmonary Perfusion  Result Date: 09/03/2019 CLINICAL DATA:  Inpatient.  Elevated D-dimer.  Abdominal pain. EXAM: NUCLEAR MEDICINE PERFUSION LUNG SCAN TECHNIQUE: Perfusion images were obtained in multiple projections after intravenous injection of radiopharmaceutical. Ventilation scans were attempted but not tolerated by the patient (patient vomited after breathing apparatus placed). RADIOPHARMACEUTICALS:  1.5 mCi Tc-36m MAA IV COMPARISON:  Chest radiograph from earlier today. FINDINGS: There are multiple small moderate perfusion defects at posterior lung bases bilaterally, correlating with areas of airspace disease on the chest radiograph from earlier today. IMPRESSION: Perfusion-only scan, see comments. Multiple small to moderate perfusion defects at the posterior lung bases bilaterally with chest radiograph correlates. Findings are considered intermediate probability for acute pulmonary embolism. Electronically Signed   By: Delbert Phenix M.D.   On: 09/03/2019 16:52   Ct Abdomen Pelvis W Contrast  Result Date: 09/03/2019 CLINICAL DATA:  Abdominal  pain, MRI suggesting omental caking. EXAM: CT CHEST CTA protocol of the chest and CT of the ABDOMEN, AND PELVIS WITH CONTRAST TECHNIQUE: Multidetector CT imaging of the chest utilizing CTA, PE protocol, and abdomen and pelvis was performed following the standard protocol during bolus administration of intravenous contrast. Maximum intensity projection reconstructions were generated, submitted and were reviewed. CONTRAST:  68mL OMNIPAQUE IOHEXOL 350 MG/ML SOLN COMPARISON:  MRI 09/02/2019 prior CT assessments dating back to October of 2020 FINDINGS: CT CHEST FINDINGS Cardiovascular: Scattered aortic atherosclerosis. Mild dilation of ascending thoracic aorta  3.9 cm. Heart size is mildly enlarged without pericardial fluid. No signs of pulmonary embolism. Mediastinum/Nodes: No signs of mediastinal lymphadenopathy. Small amount of loculated fluid adjacent to a small hiatal hernia. Fluid is contiguous with hepatic gastric in left subdiaphragmatic fluid. No signs of adenopathy in the chest. Lungs/Pleura: Basilar collapse/consolidation bilaterally. Small bilateral pleural effusions. Airways are patent. Musculoskeletal: No signs of chest wall mass. No signs of acute bone finding or evidence of destructive bone process. No signs of chest wall mass. CT ABDOMEN PELVIS FINDINGS Hepatobiliary: Interval development of extensive perihepatic fluid with scalloping of the a Paddock margin. Fluid extends into the hepatic gastric recess, left subdiaphragmatic space along the right hemi liver and in the right pericolic gutter. Also tracking into the pelvis density between 15 and 20 Hounsfield units, nonspecific. Diffuse irregular gallbladder wall thickening. Gallbladder is less distended than on the previous exam. No focal mass lesion in the gallbladder. Pancreas: No signs of acute interstitial pancreatitis, ductal dilation or lesion. Spleen: Normal in size without focal abnormality. Adrenals/Urinary Tract: Normal adrenal glands. Symmetric enhancement of the bilateral kidneys. Stomach/Bowel: Small hiatal hernia. Signs of interloop fluid along the jejunum. Developing bowel distension since the MRI of 09/02/2019. Gradual transition in the mid abdomen but with decompressed distal small bowel loops. No signs of pneumatosis. Diffuse mesenteric edema. Vascular/Lymphatic: Calcified and noncalcified atherosclerotic plaque in the abdominal aorta. No signs of aneurysm or acute abdominal vascular abnormality. The portal vein is patent. The splenic vein is patent. No signs of adenopathy in the upper abdomen or retroperitoneum. No signs of pelvic lymphadenopathy. Reproductive: Nonspecific appearance of  the prostate by CT. Other: Perihepatic and upper abdominal fluid. Fluid along the left hepatic margin with scalloping of the left hemi liver measures 8 x 8 cm. Fluid in the left subdiaphragmatic area adjacent the spleen measures 14 by 4 cm. Right flank fluid 6.8 by 2.8 cm. Smaller pockets of fluid along the anterior surface of the liver. Diffuse omental stranding or nodularity. Interloop fluid adjacent to jejunal loops in the left abdomen 5.1 x 2.2 cm. No signs of free air. Musculoskeletal: No signs of acute bone finding or evidence of destructive bone process. IMPRESSION: 1. Interval development of extensive perihepatic fluid with scalloping of the hepatic margin, associated with diffuse ascites, some areas with more loculated appearance and with interloop fluid in the left hemiabdomen. Also associated with irregular omental and peritoneal stranding along with bowel edema. Constellation of findings could be seen in the setting of peritonitis, rapid development favors this alternative. Secondarily malignant ascites could also have this appearance. Diagnostic paracentesis could be helpful. 2. Marked irregular gallbladder wall thickening, gallbladder is less distended than on the previous exam. Gallbladder compromise cannot be proven on the basis of this study though biliary peritonitis is certainly in the differential based on the appearance of the abdominal fluid. Acute on chronic cholecystitis with gallbladder compromise is considered. Based on gallbladder wall thickening neoplasm remains in the  differential. 3. No signs of extraluminal contrast or other findings that would support bowel leak or perforated ulcer as a cause for diffuse peritonitis. 4. No signs of pulmonary embolism. 5. Dense material in collapsed lung at the lung bases likely iodinated contrast, potentially aspirated by this patient with basilar airspace disease and small effusions. Aortic Atherosclerosis (ICD10-I70.0). Electronically Signed   By:  Donzetta Kohut M.D.   On: 09/03/2019 18:05   Mr 3d Recon At Scanner  Result Date: 09/02/2019 CLINICAL DATA:  Patient with possible pancreatitis. Gallbladder wall thickening on prior right upper quadrant ultrasound. EXAM: MRI ABDOMEN WITHOUT AND WITH CONTRAST (INCLUDING MRCP) TECHNIQUE: Multiplanar multisequence MR imaging of the abdomen was performed both before and after the administration of intravenous contrast. Heavily T2-weighted images of the biliary and pancreatic ducts were obtained, and three-dimensional MRCP images were rendered by post processing. CONTRAST:  9mL GADAVIST GADOBUTROL 1 MMOL/ML IV SOLN COMPARISON:  Ultrasound abdomen 09/01/2009; CT abdomen pelvis 08/04/2019 FINDINGS: Lower chest: Small bilateral pleural effusions. Consolidative opacities within the lower lungs bilaterally. Hepatobiliary: Liver is normal in size and contour. There is irregular wall thickening of the gallbladder. No cholelithiasis. Possible filling defect within the distal common bile duct on MRCP images (image 30; series 7). No intrahepatic or extrahepatic biliary ductal dilatation. Pancreas:  Unremarkable Spleen:  Unremarkable Adrenals/Urinary Tract: Normal adrenal glands. Kidneys enhance symmetrically with contrast. No hydronephrosis. Stomach/Bowel: Moderate sized hiatal hernia. Otherwise normal morphology of the stomach. No evidence for small bowel obstruction. Vascular/Lymphatic: Normal caliber abdominal aorta. No retroperitoneal lymphadenopathy. Other: There is ascites within the left upper quadrant. Additionally there is perihepatic fluid which appears loculated along the anterior hepatic margin (image 13; series 5). There is patchy enhancement scattered throughout the omentum and colonic mesentery within the central upper abdomen (image 57; series 1202). Musculoskeletal: No aggressive or acute appearing osseous lesions. IMPRESSION: 1. Persistent irregular wall thickening of the gallbladder which is nonspecific in  etiology. Findings may be secondary to acute cholecystitis in the appropriate clinical setting. The possibility of gallbladder malignancy is not excluded. 2. There appears to be loculated ascites within the left upper quadrant and within the perihepatic locations, nonspecific in etiology. Additionally, there is patchy enhancement involving the omentum and upper abdominal colonic mesentery. While these findings may be secondary to an acute infectious/inflammatory process, the possibility of omental carcinomatosis and/or malignant ascites is not entirely excluded, potentially if the gallbladder findings are secondary to a malignancy. 3. Given the multitude of findings, many of which have changed or progressed from prior CT, recommend further evaluation with contrast enhanced CT of the abdomen and pelvis. 4. There is narrowing of the distal aspect of the common bile duct on MRCP images which is likely artifactual in etiology. The possibility of a small distal CBD stone is not entirely excluded. Electronically Signed   By: Annia Belt M.D.   On: 09/02/2019 20:58   Dg Chest Port 1 View  Result Date: 09/03/2019 CLINICAL DATA:  Hypoxia. EXAM: PORTABLE CHEST 1 VIEW COMPARISON:  One-view chest x-ray 08/08/2019 FINDINGS: Heart size is exaggerated by low lung volumes. Moderate pulmonary vascular congestion is present. Bilateral pleural effusions and basilar airspace opacities are noted. The visualized soft tissues and bony thorax are unremarkable. IMPRESSION: 1. Stable appearance of bilateral pleural effusions and basilar airspace disease. While this likely reflects atelectasis, infection is not excluded. 2. Moderate pulmonary vascular congestion. Electronically Signed   By: Marin Roberts M.D.   On: 09/03/2019 05:01   Mr Abdomen Mrcp W  Wo Contast  Result Date: 09/02/2019 CLINICAL DATA:  Patient with possible pancreatitis. Gallbladder wall thickening on prior right upper quadrant ultrasound. EXAM: MRI ABDOMEN  WITHOUT AND WITH CONTRAST (INCLUDING MRCP) TECHNIQUE: Multiplanar multisequence MR imaging of the abdomen was performed both before and after the administration of intravenous contrast. Heavily T2-weighted images of the biliary and pancreatic ducts were obtained, and three-dimensional MRCP images were rendered by post processing. CONTRAST:  9mL GADAVIST GADOBUTROL 1 MMOL/ML IV SOLN COMPARISON:  Ultrasound abdomen 09/01/2009; CT abdomen pelvis 08/04/2019 FINDINGS: Lower chest: Small bilateral pleural effusions. Consolidative opacities within the lower lungs bilaterally. Hepatobiliary: Liver is normal in size and contour. There is irregular wall thickening of the gallbladder. No cholelithiasis. Possible filling defect within the distal common bile duct on MRCP images (image 30; series 7). No intrahepatic or extrahepatic biliary ductal dilatation. Pancreas:  Unremarkable Spleen:  Unremarkable Adrenals/Urinary Tract: Normal adrenal glands. Kidneys enhance symmetrically with contrast. No hydronephrosis. Stomach/Bowel: Moderate sized hiatal hernia. Otherwise normal morphology of the stomach. No evidence for small bowel obstruction. Vascular/Lymphatic: Normal caliber abdominal aorta. No retroperitoneal lymphadenopathy. Other: There is ascites within the left upper quadrant. Additionally there is perihepatic fluid which appears loculated along the anterior hepatic margin (image 13; series 5). There is patchy enhancement scattered throughout the omentum and colonic mesentery within the central upper abdomen (image 57; series 1202). Musculoskeletal: No aggressive or acute appearing osseous lesions. IMPRESSION: 1. Persistent irregular wall thickening of the gallbladder which is nonspecific in etiology. Findings may be secondary to acute cholecystitis in the appropriate clinical setting. The possibility of gallbladder malignancy is not excluded. 2. There appears to be loculated ascites within the left upper quadrant and within  the perihepatic locations, nonspecific in etiology. Additionally, there is patchy enhancement involving the omentum and upper abdominal colonic mesentery. While these findings may be secondary to an acute infectious/inflammatory process, the possibility of omental carcinomatosis and/or malignant ascites is not entirely excluded, potentially if the gallbladder findings are secondary to a malignancy. 3. Given the multitude of findings, many of which have changed or progressed from prior CT, recommend further evaluation with contrast enhanced CT of the abdomen and pelvis. 4. There is narrowing of the distal aspect of the common bile duct on MRCP images which is likely artifactual in etiology. The possibility of a small distal CBD stone is not entirely excluded. Electronically Signed   By: Annia Beltrew  Davis M.D.   On: 09/02/2019 20:58   [place] Abdomen Limited Ruq  Result Date: 09/02/2019 CLINICAL DATA:  63 year old male with history of nausea, vomiting and abdominal pain. Under in both to take deep breaths. EXAM: ULTRASOUND ABDOMEN LIMITED RIGHT UPPER QUADRANT COMPARISON:  None. FINDINGS: Gallbladder: No gallstones. Gallbladder wall appears mildly thickened ranging from 5-7 mm in thickness. No pericholecystic fluid. No sonographic Murphy sign noted by sonographer. Common bile duct: Diameter: 3 mm Liver: No focal lesion identified. Within normal limits in parenchymal echogenicity. Portal vein is patent on color Doppler imaging with normal direction of blood flow towards the liver. Other: None. IMPRESSION: 1. Gallbladder wall appears mildly thickened. This is of uncertain etiology and significance. No gallstones or findings to suggest an acute cholecystitis are noted at this time. Follow-up right upper quadrant abdominal ultrasound is suggested in 2 weeks to ensure resolution of this finding. Should this finding not resolve, further evaluation with abdominal MRI with and without IV gadolinium with MRCP would be suggested to  exclude the possibility of gallbladder malignancy. Electronically Signed   By: Trudie Reedaniel  Entrikin  M.D.   On: 09/02/2019 13:13    Lab Data:  CBC: Recent Labs  Lab 09/04/19 0127 09/05/19 0326 09/06/19 0351 09/07/19 0341 09/09/19 0354  WBC 7.4 9.6 10.7* 10.5 15.9*  NEUTROABS  --  7.8* 9.4*  --   --   HGB 14.4 12.8* 11.9* 11.3* 12.2*  HCT 44.9 39.7 38.0* 35.4* 37.7*  MCV 94.7 95.4 96.9 95.4 93.8  PLT 357 332 318 281 316   Basic Metabolic Panel: Recent Labs  Lab 09/03/19 0304  09/04/19 0127 09/05/19 0326 09/06/19 0351 09/07/19 0341 09/09/19 0354  NA  --    < > 134* 136 138 139 137  K  --    < > 4.7 4.2 4.6 3.8 3.6  CL  --    < > 104 105 103 103 99  CO2  --    < > 20* GLUCOSE  --    < > 130* 128* 143* 121* 102*  BUN  --    < > 47* 31* 25* 19 12  CREATININE  --    < > 1.58* 1.09 0.93 0.77 0.86  CALCIUM  --    < > 8.1* 8.0* 7.8* 7.9* 8.2*  MG 2.2  --  2.4  --   --   --   --   PHOS 3.4  --   --   --   --   --   --    < > = values in this interval not displayed.   GFR: Estimated Creatinine Clearance: 92 mL/min (by C-G formula based on SCr of 0.86 mg/dL). Liver Function Tests: Recent Labs  Lab 09/02/19 1127 09/04/19 0127 09/05/19 0326 09/06/19 0351  AST 17 18 12* 36  ALT ALKPHOS 43 38 33* 38  BILITOT 1.3* 2.0* 1.4* 1.0  PROT 7.5 5.6* 5.4* 5.1*  ALBUMIN 4.0 2.7* 2.5* 2.1*   Recent Labs  Lab 09/02/19 1127  LIPASE 55*   No results for input(s): AMMONIA in the last 168 hours. Coagulation Profile: No results for input(s): INR, PROTIME in the last 168 hours. Cardiac Enzymes: No results for input(s): CKTOTAL, CKMB, CKMBINDEX, TROPONINI in the last 168 hours. BNP (last 3 results) No results for input(s): PROBNP in the last 8760 hours. HbA1C: No results for input(s): HGBA1C in the last 72 hours. CBG: Recent Labs  Lab 09/07/19 1551 09/07/19 2028 09/08/19 0041 09/08/19 0431 09/08/19 0747  GLUCAP 105* 104* 97 96 94   Lipid Profile: No  results for input(s): CHOL, HDL, LDLCALC, TRIG, CHOLHDL, LDLDIRECT in the last 72 hours. Thyroid Function Tests: No results for input(s): TSH, T4TOTAL, FREET4, T3FREE, THYROIDAB in the last 72 hours. Anemia Panel: No results for input(s): VITAMINB12, FOLATE, FERRITIN, TIBC, IRON, RETICCTPCT in the last 72 hours. Urine analysis:    Component Value Date/Time   COLORURINE AMBER (A) 09/02/2019 1631   APPEARANCEUR CLOUDY (A) 09/02/2019 1631   LABSPEC 1.029 09/02/2019 1631   PHURINE 5.0 09/02/2019 1631   GLUCOSEU 50 (A) 09/02/2019 1631   HGBUR NEGATIVE 09/02/2019 1631   BILIRUBINUR SMALL (A) 09/02/2019 1631   KETONESUR 5 (A) 09/02/2019 1631   PROTEINUR 30 (A) 09/02/2019 1631   UROBILINOGEN 0.2 04/01/2015 1155   NITRITE NEGATIVE 09/02/2019 1631   LEUKOCYTESUR NEGATIVE 09/02/2019 1631     Witt Plitt M.D. [facility] 09/09/2019, 9:57 AM   Call night coverage person covering after 7pm

## 2019-09-09 NOTE — Progress Notes (Signed)
Patient ID: Corey Logan, male   DOB: 1956-02-28, 63 y.o.   MRN: [id number]    4 Days Post-Op  Subjective: Feels well.  Had multiple loose BM but have since become solid. Tolerating bites of soft diet - appetite slowly pickup back up, still feeling a bit bloated.  Denies nausea/vomiting  Minimal abdominal pain.   Objective: Vital signs in last 24 hours: Temp:  [98.4 F (36.9 C)-99.6 F (37.6 C)] 98.4 F (36.9 C) (12/06 0520) Pulse Rate:  [83-87] 83 (12/06 0520) Resp:  [14-20] 18 (12/06 0520) BP: (122-150)/(79-85) 122/79 (12/06 0520) SpO2:  [93 %-98 %] 93 % (12/06 0520) Last BM Date: 09/08/19  Intake/Output from previous day: 12/05 0701 - 12/06 0700 In: 177.4 [IV Piggyback:177.4] Out: 760 [Urine:625; Drains:135] Intake/Output this shift: Total I/O In: -  Out: 400 [Urine:400]  PE: NAD, comfortable Abd: soft, appropriately tender, incisions c/d/i, all 3 drains today with essentially just sero to serosang output.   Lab Results:  Recent Labs    09/07/19 0341 09/09/19 0354  WBC 10.5 15.9*  HGB 11.3* 12.2*  HCT 35.4* 37.7*  PLT 281 316   BMET Recent Labs    09/07/19 0341 09/09/19 0354  NA 139 137  K 3.8 3.6  CL 103 99  CO2 26 26  GLUCOSE 121* 102*  BUN 19 12  CREATININE 0.77 0.86  CALCIUM 7.9* 8.2*   PT/INR No results for input(s): LABPROT, INR in the last 72 hours. CMP     Component Value Date/Time   NA 137 09/09/2019 0354   K 3.6 09/09/2019 0354   CL 99 09/09/2019 0354   CO2 26 09/09/2019 0354   GLUCOSE 102 (H) 09/09/2019 0354   BUN 12 09/09/2019 0354   CREATININE 0.86 09/09/2019 0354   CALCIUM 8.2 (L) 09/09/2019 0354   PROT 5.1 (L) 09/06/2019 0351   ALBUMIN 2.1 (L) 09/06/2019 0351   AST 36 09/06/2019 0351   ALT 26 09/06/2019 0351   ALKPHOS 38 09/06/2019 0351   BILITOT 1.0 09/06/2019 0351   GFRNONAA >60 09/09/2019 0354   GFRAA >60 09/09/2019 0354   Lipase     Component Value Date/Time   LIPASE 55 (H) 09/02/2019 1127        Studies/Results: No results found.  Anti-infectives: Anti-infectives (From admission, onward)   Start     Dose/Rate Route Frequency Ordered Stop   09/04/19 0000  piperacillin-tazobactam (ZOSYN) IVPB 3.375 g     3.375 g 12.5 mL/hr over 240 Minutes Intravenous Every 8 hours 09/03/19 1828     09/03/19 1830  piperacillin-tazobactam (ZOSYN) IVPB 3.375 g     3.375 g 100 mL/hr over 30 Minutes Intravenous STAT 09/03/19 1828 09/03/19 1916       Assessment/Plan H/o heavy ETOH abuse with recent DTs - states he has abstained since recent admission HTN   POD 4, s/p dx lap with drainage of bilious ascites, cholecystectomy, biopsy of appendiceal epiploicum, Dr. Ezzard Standing 12/2 -unclear etiology still of diffuse ascites, but did not appear to be secondary to the gallbladder - this was removed to take this off table as etiology -significant saponification was noted but this was expected secondary to bilious ascites. -small vascular nodule noted on an appendiceal epiploicum that was biopsied -ascites sent for cx and cytology, pending -cont reg diet - keep drains in today - waiting til he eats better; per Dr. Ezzard Standing: ..drains "can come out prior to discharge.  I would pull his RUQ drain last, since that is the area of  most concern during surgery, I found no cause for the bile peritonitis. -WBC bumped today but afebrile, no signs of ileus, exam and drains benign -will need about 5 days of post op abx therapy - completes tomorrow  FEN -regular diet, SLIV VTE - SQH, SCDs ID [email or web address] 12/1-->   LOS: 6 days   Nadeen Landau, M.D. [facility] Use [email or web address] to contact on call provider

## 2019-09-10 LAB — BASIC METABOLIC PANEL
Anion gap: 11 (ref 5–15)
BUN: 9 mg/dL (ref 8–23)
CO2: 24 mmol/L (ref 22–32)
Calcium: 7.9 mg/dL — ABNORMAL LOW (ref 8.9–10.3)
Chloride: 101 mmol/L (ref 98–111)
Creatinine, Ser: 0.8 mg/dL (ref 0.61–1.24)
GFR calc Af Amer: >60 mL/min (ref >60)
GFR calc non Af Amer: >60 mL/min (ref >60)
Glucose, Bld: 104 mg/dL — ABNORMAL HIGH (ref 70–99)
Potassium: 3.1 mmol/L — ABNORMAL LOW (ref 3.5–5.1)
Sodium: 136 mmol/L (ref 135–145)

## 2019-09-10 LAB — CBC
HCT: 35.8 % — ABNORMAL LOW (ref 39.0–52.0)
Hemoglobin: 11.7 g/dL — ABNORMAL LOW (ref 13.0–17.0)
MCH: 30.5 pg (ref 26.0–34.0)
MCHC: 32.7 g/dL (ref 30.0–36.0)
MCV: 93.2 fL (ref 80.0–100.0)
Platelets: 314 10*3/uL (ref 150–400)
RBC: 3.84 MIL/uL — ABNORMAL LOW (ref 4.22–5.81)
RDW: 12.4 % (ref 11.5–15.5)
WBC: 15.7 10*3/uL — ABNORMAL HIGH (ref 4.0–10.5)
nRBC: 0 % (ref 0.0–0.2)

## 2019-09-10 MED ORDER — PHENYLEPHRINE HCL-NACL 10-0.9 MG/250ML-% IV SOLN
INTRAVENOUS | Status: AC
  Filled 2019-09-10: qty 500

## 2019-09-10 MED ORDER — POTASSIUM CHLORIDE CRYS ER 20 MEQ PO TBCR
40.0000 meq | EXTENDED_RELEASE_TABLET | Freq: Once | ORAL | Status: AC
  Administered 2019-09-10: 40 meq via ORAL
  Filled 2019-09-10: qty 2

## 2019-09-10 NOTE — Care Management Important Message (Signed)
Important Message  Patient Details IM Letter given to Cookie McGibboney RN to present to the Patient Name: Corey Logan MRN: [id number] Date of Birth: July 08, 1956   Medicare Important Message Given:  Yes     Kerin Salen 09/10/2019, 12:17 PM

## 2019-09-10 NOTE — Progress Notes (Signed)
[facility]                                                                              Patient Demographics  Corey Logan, is a 63 y.o. male, DOB - 01/18/1956, BJY:[id number]  Admit date - 09/02/2019   Admitting Physician Therisa Doyne, MD  Outpatient Primary MD for the patient is Elias Else, MD  Outpatient specialists:   LOS - 7  days   Medical records reviewed and are as summarized below:    Chief Complaint  Patient presents with   Abdominal Pain       Brief summary   63 y.o.malewith medical history significant of arthritis, depression, GERD, hiatal hernia, hypertensionalcohol dependence presented to ED on 11/29 with nausea, vomiting, right upper quadrant pain after he ate eggs and steak radiating to his right and left shoulder.States that he quit alcohol and have not had any for the past 1 month. Patient was recently admitted in October with RUQ pain- work-up with CT scan showed gallbladder thickening, USG showed no evidence of gallstones or significant GB wall/ CBD abnormality, HIDA scan showed normal biliary patency and normal ejection fraction. GS evaluated patient and felt that his symptoms were not related to his gallbladder disease-recommended GI eval for EGD. GI series showed small sliding hiatal hernia and GERD, mild esophageal dysmotility.While hospitalized patient developed acute alcohol withdrawal syndrome /encephalopathy with w/u including EEG/CT head.   Hospital course: Patient admitted to [facility] with medical supportive management and GS/GI re-consulted. MRCP reports distal CBD stone vs artifact, GB wall Irregularity,loculated ascites within the left upper quadrant and within the perihepatic locations, nonspecific in etiology. Additionally, patchy enhancement involving the omentum and upper abdominal colonic mesentery-concerning for malignancy-Repeat CT A/P per GS along with CA 19-9, AFP, CA 125 sent. COVID negative-D dimer 3.71.Started on  CLD. HC complicated by persistent nausea/vomiting, AKI requiring IVF and acute hypoxemic respiratory failure-Noted effusions/atelectasis on CXR- CT chest -ve for PE. Repeat HIDA wnl on 12/1. Given persistent symptoms, taken to OR on 12/2. Intraoperative findings consistent with biliary ascites possibly gallbladder source-given gallbladder thickening and possibility of chronic cholecystitis, cholecystectomy was performed as well.  After abdominal irrigation, 3 drains placed.GS following. Awaiting clearance from surgery for discharge.  COVID-19 test negative  Assessment & Plan     Abdominal pain with nausea vomiting secondary to chronic cholecystitis, bilious ascites with saponification of the omental fat -MRCP showed significant findings of gallbladder wall irregularity, omental thickening and ascites concerning for infectious versus malignancy versus biliary peritonitis -Repeat HIDA scan within normal limits -General surgery was consulted, underwent laparoscopic evaluation of bilious ascites, intraoperative cholangiogram (unremarkable), cholecystectomy.  - Cytology showed reactive mesothelial cells.  Pathology consistent with chronic cholecystitis, left colon epiploicae appendices pathology consistent with benign fibroadipose tissue -EGD was unremarkable. -Tumor markers unremarkable CA 19-9 8.0, CA-125 61, AFP 1.1. -WBC count still elevated, appears to have plateaued, no fevers.  General surgery following, will await their recommendations and drains.  Patient is tolerating diet  Acute respiratory failure with hypoxia -Likely due to pleural effusions/atelectasis, BNP 41 -Stable, O2 sats 96% on room air.  Preoperatively requiring 2 to 3 L of  O2 via nasal cannula, much improved   Acute kidney injury -Creatinine 0.84 on 08/11/19, creatinine during this admission peaked to 1.68, possibly ATN versus prerenal due to intractable nausea and vomiting -Creatinine  stable  Hypokalemia -Replaced  History of alcohol dependence with recent episode of alcohol withdrawal -Patient reports abstinence for at least a month now, not in any withdrawals Continue multivitamin, thiamine  GERD/hiatal hernia with esophageal dysmotility -Underwent EGD last admission, continue PPI  Mild normocytic anemia -Hemoglobin 18.0 at the time of admission, likely hemoconcentrated due to dehydration -H&H currently stable, improving from yesterday, 12.2  Elevated BP readings BP stable, continue IV hydralazine as needed with parameters.  Outpatient not on any antihypertensives.    Arthritis Avoid NSAIDs   Code Status: Full code DVT Prophylaxis: Heparin subcu Family Communication: Discussed all imaging results, lab results, explained to the patient    Disposition Plan: Awaiting clearance from surgery regarding disposition/discharge  Time Spent in minutes 25 minutes  Procedures:  09/05/2019 Diagnostic LAPAROSCOPY, evacuation of bilious ascites (90 minutes for diagnostic lap and evacuation of bilious ascites), UPPER Endoscopy, LAPAROSCOPIC CHOLECYSTECTOMY WITH INTRAOPERATIVE CHOLANGIOGRAM, biopsy of left colon appendices epiploicae [photos at the end of the dictation]    Consultants:   Gastroenterology General surgery  Antimicrobials:   Anti-infectives (From admission, onward)   Start     Dose/Rate Route Frequency Ordered Stop   09/04/19 0000  piperacillin-tazobactam (ZOSYN) IVPB 3.375 g     3.375 g 12.5 mL/hr over 240 Minutes Intravenous Every 8 hours 09/03/19 1828 09/10/19 2359   09/03/19 1830  piperacillin-tazobactam (ZOSYN) IVPB 3.375 g     3.375 g 100 mL/hr over 30 Minutes Intravenous STAT 09/03/19 1828 09/03/19 1916         Medications  Scheduled Meds:  heparin injection (subcutaneous)  5,000 Units Subcutaneous Q8H   multivitamin with minerals  1 tablet Oral Daily   pantoprazole  40 mg Oral Daily   thiamine  100 mg Oral Daily    Continuous Infusions:  phenylephrine     piperacillin-tazobactam (ZOSYN)  IV 3.375 g (09/10/19 0908)   PRN Meds:.acetaminophen **OR** acetaminophen, hydrALAZINE, HYDROmorphone (DILAUDID) injection, labetalol, loperamide, LORazepam, ondansetron **OR** ondansetron (ZOFRAN) IV, oxyCODONE      Subjective:   Kahmari Koller was seen and examined today.  Tolerating diet, had BM, no diarrhea per patient today.  No fevers or chills.  Wants to go home soon.  No nausea or vomiting. Patient denies dizziness, chest pain, shortness of breath, new weakness, numbess, tingling. No acute events overnight.    Objective:   Vitals:   09/09/19 0520 09/09/19 1238 09/09/19 1953 09/10/19 0454  BP: 122/79 (!) 147/82 (!) 142/85 (!) 145/79  Pulse: 83 89 94 79  Resp: 18 16 18 17   Temp: 98.4 F (36.9 C) 98.6 F (37 C) 99 F (37.2 C) 98.5 F (36.9 C)  TempSrc: Oral Oral Oral Oral  SpO2: 93% 97% 99% 96%  Weight:      Height:        Intake/Output Summary (Last 24 hours) at 09/10/2019 1200 Last data filed at 09/10/2019 0951 Gross per 24 hour  Intake 630 ml  Output 1515 ml  Net -885 ml     Wt Readings from Last 3 Encounters:  09/03/19 81.9 kg  08/04/19 88.5 kg  12/22/15 93.9 kg    Physical Exam  General: Alert and oriented x 3, NAD  Eyes:   HEENT:   Cardiovascular: S1 S2 clear, no murmurs, RRR. No pedal edema b/l  Respiratory:  CTAB, no wheezing, rales or rhonchi  Gastrointestinal: Soft, appropriately tender, incisions CDI, drains with serous fluid   Ext: no pedal edema bilaterally  Neuro: no new deficits  Musculoskeletal: No cyanosis, clubbing  Skin: No rashes  Psych: Normal affect and demeanor, alert and oriented x3        Data Reviewed:  I have personally reviewed following labs and imaging studies  Micro Results Recent Results (from the past 240 hour(s))  SARS CORONAVIRUS 2 (TAT 6-24 HRS) Nasopharyngeal Nasopharyngeal Swab     Status: None   Collection Time:  09/03/19 12:19 AM   Specimen: Nasopharyngeal Swab  Result Value Ref Range Status   SARS Coronavirus 2 NEGATIVE NEGATIVE Final    Comment: (NOTE) SARS-CoV-2 target nucleic acids are NOT DETECTED. The SARS-CoV-2 RNA is generally detectable in upper and lower respiratory specimens during the acute phase of infection. Negative results do not preclude SARS-CoV-2 infection, do not rule out co-infections with other pathogens, and should not be used as the sole basis for treatment or other patient management decisions. Negative results must be combined with clinical observations, patient history, and epidemiological information. The expected result is Negative. Fact Sheet for Patients: HairSlick.no Fact Sheet for Healthcare Providers: quierodirigir.com This test is not yet approved or cleared by the [place] FDA and  has been authorized for detection and/or diagnosis of SARS-CoV-2 by FDA under an Emergency Use Authorization (EUA). This EUA will remain  in effect (meaning this test can be used) for the duration of the COVID-19 declaration under Section 56 4(b)(1) of the Act, 21 U.S.C. section 360bbb-3(b)(1), unless the authorization is terminated or revoked sooner. Performed at [facility], [address], [place], [place] [address]   Surgical PCR screen     Status: None   Collection Time: 09/04/19  6:04 PM   Specimen: Nasal Mucosa; Nasal Swab  Result Value Ref Range Status   MRSA, PCR NEGATIVE NEGATIVE Final   Staphylococcus aureus NEGATIVE NEGATIVE Final    Comment: (NOTE) The Xpert SA Assay (FDA approved for NASAL specimens in patients 67 years of age and older), is one component of a comprehensive surveillance program. It is not intended to diagnose infection nor to guide or monitor treatment. Performed at [facility], [address] [place], [place] [address]   Fungus Stain     Status: None    Collection Time: 09/05/19  4:56 PM   Specimen: PATH Cytology Peritoneal fluid; Body Fluid  Result Value Ref Range Status   FUNGUS STAIN Final report  Final    Comment: (NOTE) Performed At: [facility] [address] [place], [place] [id number] Jolene Schimke MD WG:[id number]    Fungal Source PERITONEAL  Final    Comment: Performed at [facility], [address] [place], [place] [address]  Culture, body fluid-bottle     Status: None (Preliminary result)   Collection Time: 09/05/19  4:56 PM   Specimen: Peritoneal Washings  Result Value Ref Range Status   Specimen Description PERITONEAL  Final   Special Requests NONE  Final   Culture   Final    NO GROWTH 3 DAYS Performed at [facility], [address] [place], [place] [address]    Report Status PENDING  Incomplete  Gram stain     Status: None   Collection Time: 09/05/19  4:56 PM   Specimen: Peritoneal Washings  Result Value Ref Range Status   Specimen Description PERITONEAL  Final   Special  Requests NONE  Final   Gram Stain   Final    FEW WBC PRESENT,BOTH PMN AND MONONUCLEAR NO ORGANISMS SEEN Performed at [facility], [address] [place], [place] [address]    Report Status 09/06/2019 FINAL  Final  Fungal Stain reflex     Status: None   Collection Time: 09/05/19  4:56 PM  Result Value Ref Range Status   Fungal stain result 1 Comment  Final    Comment: (NOTE) KOH/Calcofluor preparation:  no fungus observed. Performed At: [facility] [address] [place], [place] [id number] Jolene Schimke MD JS:[id number]     Radiology Reports Dg Cholangiogram Operative  Result Date: 09/06/2019 CLINICAL DATA:  Gallbladder wall thickening EXAM: INTRAOPERATIVE CHOLANGIOGRAM TECHNIQUE: Cholangiographic images from the C-arm fluoroscopic device were submitted for interpretation post-operatively. Please see the procedural report for the amount of contrast and the fluoroscopy time  utilized. COMPARISON:  CT 09/03/2019 FINDINGS: No persistent filling defects in the common duct. Intrahepatic ducts are incompletely visualized, appearing decompressed centrally. Contrast passes into the duodenum. : Negative for retained common duct stone. Electronically Signed   By: Corlis Leak M.D.   On: 09/06/2019 05:45   Ct Angio Chest Pe W Or Wo Contrast  Result Date: 09/03/2019 CLINICAL DATA:  Abdominal pain, MRI suggesting omental caking. EXAM: CT CHEST CTA protocol of the chest and CT of the ABDOMEN, AND PELVIS WITH CONTRAST TECHNIQUE: Multidetector CT imaging of the chest utilizing CTA, PE protocol, and abdomen and pelvis was performed following the standard protocol during bolus administration of intravenous contrast. Maximum intensity projection reconstructions were generated, submitted and were reviewed. CONTRAST:  1mL OMNIPAQUE IOHEXOL 350 MG/ML SOLN COMPARISON:  MRI 09/02/2019 prior CT assessments dating back to October of 2020 FINDINGS: CT CHEST FINDINGS Cardiovascular: Scattered aortic atherosclerosis. Mild dilation of ascending thoracic aorta 3.9 cm. Heart size is mildly enlarged without pericardial fluid. No signs of pulmonary embolism. Mediastinum/Nodes: No signs of mediastinal lymphadenopathy. Small amount of loculated fluid adjacent to a small hiatal hernia. Fluid is contiguous with hepatic gastric in left subdiaphragmatic fluid. No signs of adenopathy in the chest. Lungs/Pleura: Basilar collapse/consolidation bilaterally. Small bilateral pleural effusions. Airways are patent. Musculoskeletal: No signs of chest wall mass. No signs of acute bone finding or evidence of destructive bone process. No signs of chest wall mass. CT ABDOMEN PELVIS FINDINGS Hepatobiliary: Interval development of extensive perihepatic fluid with scalloping of the a Paddock margin. Fluid extends into the hepatic gastric recess, left subdiaphragmatic space along the right hemi liver and in the right pericolic gutter.  Also tracking into the pelvis density between 15 and 20 Hounsfield units, nonspecific. Diffuse irregular gallbladder wall thickening. Gallbladder is less distended than on the previous exam. No focal mass lesion in the gallbladder. Pancreas: No signs of acute interstitial pancreatitis, ductal dilation or lesion. Spleen: Normal in size without focal abnormality. Adrenals/Urinary Tract: Normal adrenal glands. Symmetric enhancement of the bilateral kidneys. Stomach/Bowel: Small hiatal hernia. Signs of interloop fluid along the jejunum. Developing bowel distension since the MRI of 09/02/2019. Gradual transition in the mid abdomen but with decompressed distal small bowel loops. No signs of pneumatosis. Diffuse mesenteric edema. Vascular/Lymphatic: Calcified and noncalcified atherosclerotic plaque in the abdominal aorta. No signs of aneurysm or acute abdominal vascular abnormality. The portal vein is patent. The splenic vein is patent. No signs of adenopathy in the upper abdomen or retroperitoneum. No signs of pelvic lymphadenopathy. Reproductive: Nonspecific appearance of the prostate by CT. Other: Perihepatic and upper abdominal fluid. Fluid  along the left hepatic margin with scalloping of the left hemi liver measures 8 x 8 cm. Fluid in the left subdiaphragmatic area adjacent the spleen measures 14 by 4 cm. Right flank fluid 6.8 by 2.8 cm. Smaller pockets of fluid along the anterior surface of the liver. Diffuse omental stranding or nodularity. Interloop fluid adjacent to jejunal loops in the left abdomen 5.1 x 2.2 cm. No signs of free air. Musculoskeletal: No signs of acute bone finding or evidence of destructive bone process. IMPRESSION: 1. Interval development of extensive perihepatic fluid with scalloping of the hepatic margin, associated with diffuse ascites, some areas with more loculated appearance and with interloop fluid in the left hemiabdomen. Also associated with irregular omental and peritoneal stranding  along with bowel edema. Constellation of findings could be seen in the setting of peritonitis, rapid development favors this alternative. Secondarily malignant ascites could also have this appearance. Diagnostic paracentesis could be helpful. 2. Marked irregular gallbladder wall thickening, gallbladder is less distended than on the previous exam. Gallbladder compromise cannot be proven on the basis of this study though biliary peritonitis is certainly in the differential based on the appearance of the abdominal fluid. Acute on chronic cholecystitis with gallbladder compromise is considered. Based on gallbladder wall thickening neoplasm remains in the differential. 3. No signs of extraluminal contrast or other findings that would support bowel leak or perforated ulcer as a cause for diffuse peritonitis. 4. No signs of pulmonary embolism. 5. Dense material in collapsed lung at the lung bases likely iodinated contrast, potentially aspirated by this patient with basilar airspace disease and small effusions. Aortic Atherosclerosis (ICD10-I70.0). Electronically Signed   By: Donzetta Kohut M.D.   On: 09/03/2019 18:05   Nm Hepatobiliary Liver Func  Result Date: 09/04/2019 CLINICAL DATA:  Right upper quadrant pain EXAM: NUCLEAR MEDICINE HEPATOBILIARY IMAGING TECHNIQUE: Sequential images of the abdomen were obtained out to 60 minutes following intravenous administration of radiopharmaceutical. RADIOPHARMACEUTICALS:  7.8 mCi Tc-7m  Choletec IV COMPARISON:  CT abdomen and pelvis September 03, 2019 FINDINGS: Prompt uptake and biliary excretion of activity by the liver is seen. Gallbladder activity is visualized, consistent with patency of cystic duct. Biliary activity passes into small bowel, consistent with patent common bile duct. IMPRESSION: Study within normal limits. Electronically Signed   By: Bretta Bang III M.D.   On: 09/04/2019 14:05   Nm Pulmonary Perfusion  Result Date: 09/03/2019 CLINICAL DATA:   Inpatient.  Elevated D-dimer.  Abdominal pain. EXAM: NUCLEAR MEDICINE PERFUSION LUNG SCAN TECHNIQUE: Perfusion images were obtained in multiple projections after intravenous injection of radiopharmaceutical. Ventilation scans were attempted but not tolerated by the patient (patient vomited after breathing apparatus placed). RADIOPHARMACEUTICALS:  1.5 mCi Tc-38m MAA IV COMPARISON:  Chest radiograph from earlier today. FINDINGS: There are multiple small moderate perfusion defects at posterior lung bases bilaterally, correlating with areas of airspace disease on the chest radiograph from earlier today. IMPRESSION: Perfusion-only scan, see comments. Multiple small to moderate perfusion defects at the posterior lung bases bilaterally with chest radiograph correlates. Findings are considered intermediate probability for acute pulmonary embolism. Electronically Signed   By: Delbert Phenix M.D.   On: 09/03/2019 16:52   Ct Abdomen Pelvis W Contrast  Result Date: 09/03/2019 CLINICAL DATA:  Abdominal pain, MRI suggesting omental caking. EXAM: CT CHEST CTA protocol of the chest and CT of the ABDOMEN, AND PELVIS WITH CONTRAST TECHNIQUE: Multidetector CT imaging of the chest utilizing CTA, PE protocol, and abdomen and pelvis was performed following the standard protocol  during bolus administration of intravenous contrast. Maximum intensity projection reconstructions were generated, submitted and were reviewed. CONTRAST:  80mL OMNIPAQUE IOHEXOL 350 MG/ML SOLN COMPARISON:  MRI 09/02/2019 prior CT assessments dating back to October of 2020 FINDINGS: CT CHEST FINDINGS Cardiovascular: Scattered aortic atherosclerosis. Mild dilation of ascending thoracic aorta 3.9 cm. Heart size is mildly enlarged without pericardial fluid. No signs of pulmonary embolism. Mediastinum/Nodes: No signs of mediastinal lymphadenopathy. Small amount of loculated fluid adjacent to a small hiatal hernia. Fluid is contiguous with hepatic gastric in left  subdiaphragmatic fluid. No signs of adenopathy in the chest. Lungs/Pleura: Basilar collapse/consolidation bilaterally. Small bilateral pleural effusions. Airways are patent. Musculoskeletal: No signs of chest wall mass. No signs of acute bone finding or evidence of destructive bone process. No signs of chest wall mass. CT ABDOMEN PELVIS FINDINGS Hepatobiliary: Interval development of extensive perihepatic fluid with scalloping of the a Paddock margin. Fluid extends into the hepatic gastric recess, left subdiaphragmatic space along the right hemi liver and in the right pericolic gutter. Also tracking into the pelvis density between 15 and 20 Hounsfield units, nonspecific. Diffuse irregular gallbladder wall thickening. Gallbladder is less distended than on the previous exam. No focal mass lesion in the gallbladder. Pancreas: No signs of acute interstitial pancreatitis, ductal dilation or lesion. Spleen: Normal in size without focal abnormality. Adrenals/Urinary Tract: Normal adrenal glands. Symmetric enhancement of the bilateral kidneys. Stomach/Bowel: Small hiatal hernia. Signs of interloop fluid along the jejunum. Developing bowel distension since the MRI of 09/02/2019. Gradual transition in the mid abdomen but with decompressed distal small bowel loops. No signs of pneumatosis. Diffuse mesenteric edema. Vascular/Lymphatic: Calcified and noncalcified atherosclerotic plaque in the abdominal aorta. No signs of aneurysm or acute abdominal vascular abnormality. The portal vein is patent. The splenic vein is patent. No signs of adenopathy in the upper abdomen or retroperitoneum. No signs of pelvic lymphadenopathy. Reproductive: Nonspecific appearance of the prostate by CT. Other: Perihepatic and upper abdominal fluid. Fluid along the left hepatic margin with scalloping of the left hemi liver measures 8 x 8 cm. Fluid in the left subdiaphragmatic area adjacent the spleen measures 14 by 4 cm. Right flank fluid 6.8 by 2.8  cm. Smaller pockets of fluid along the anterior surface of the liver. Diffuse omental stranding or nodularity. Interloop fluid adjacent to jejunal loops in the left abdomen 5.1 x 2.2 cm. No signs of free air. Musculoskeletal: No signs of acute bone finding or evidence of destructive bone process. IMPRESSION: 1. Interval development of extensive perihepatic fluid with scalloping of the hepatic margin, associated with diffuse ascites, some areas with more loculated appearance and with interloop fluid in the left hemiabdomen. Also associated with irregular omental and peritoneal stranding along with bowel edema. Constellation of findings could be seen in the setting of peritonitis, rapid development favors this alternative. Secondarily malignant ascites could also have this appearance. Diagnostic paracentesis could be helpful. 2. Marked irregular gallbladder wall thickening, gallbladder is less distended than on the previous exam. Gallbladder compromise cannot be proven on the basis of this study though biliary peritonitis is certainly in the differential based on the appearance of the abdominal fluid. Acute on chronic cholecystitis with gallbladder compromise is considered. Based on gallbladder wall thickening neoplasm remains in the differential. 3. No signs of extraluminal contrast or other findings that would support bowel leak or perforated ulcer as a cause for diffuse peritonitis. 4. No signs of pulmonary embolism. 5. Dense material in collapsed lung at the lung bases likely iodinated contrast,  potentially aspirated by this patient with basilar airspace disease and small effusions. Aortic Atherosclerosis (ICD10-I70.0). Electronically Signed   By: Donzetta Kohut M.D.   On: 09/03/2019 18:05   Mr 3d Recon At Scanner  Result Date: 09/02/2019 CLINICAL DATA:  Patient with possible pancreatitis. Gallbladder wall thickening on prior right upper quadrant ultrasound. EXAM: MRI ABDOMEN WITHOUT AND WITH CONTRAST  (INCLUDING MRCP) TECHNIQUE: Multiplanar multisequence MR imaging of the abdomen was performed both before and after the administration of intravenous contrast. Heavily T2-weighted images of the biliary and pancreatic ducts were obtained, and three-dimensional MRCP images were rendered by post processing. CONTRAST:  9mL GADAVIST GADOBUTROL 1 MMOL/ML IV SOLN COMPARISON:  Ultrasound abdomen 09/01/2009; CT abdomen pelvis 08/04/2019 FINDINGS: Lower chest: Small bilateral pleural effusions. Consolidative opacities within the lower lungs bilaterally. Hepatobiliary: Liver is normal in size and contour. There is irregular wall thickening of the gallbladder. No cholelithiasis. Possible filling defect within the distal common bile duct on MRCP images (image 30; series 7). No intrahepatic or extrahepatic biliary ductal dilatation. Pancreas:  Unremarkable Spleen:  Unremarkable Adrenals/Urinary Tract: Normal adrenal glands. Kidneys enhance symmetrically with contrast. No hydronephrosis. Stomach/Bowel: Moderate sized hiatal hernia. Otherwise normal morphology of the stomach. No evidence for small bowel obstruction. Vascular/Lymphatic: Normal caliber abdominal aorta. No retroperitoneal lymphadenopathy. Other: There is ascites within the left upper quadrant. Additionally there is perihepatic fluid which appears loculated along the anterior hepatic margin (image 13; series 5). There is patchy enhancement scattered throughout the omentum and colonic mesentery within the central upper abdomen (image 57; series 1202). Musculoskeletal: No aggressive or acute appearing osseous lesions. IMPRESSION: 1. Persistent irregular wall thickening of the gallbladder which is nonspecific in etiology. Findings may be secondary to acute cholecystitis in the appropriate clinical setting. The possibility of gallbladder malignancy is not excluded. 2. There appears to be loculated ascites within the left upper quadrant and within the perihepatic locations,  nonspecific in etiology. Additionally, there is patchy enhancement involving the omentum and upper abdominal colonic mesentery. While these findings may be secondary to an acute infectious/inflammatory process, the possibility of omental carcinomatosis and/or malignant ascites is not entirely excluded, potentially if the gallbladder findings are secondary to a malignancy. 3. Given the multitude of findings, many of which have changed or progressed from prior CT, recommend further evaluation with contrast enhanced CT of the abdomen and pelvis. 4. There is narrowing of the distal aspect of the common bile duct on MRCP images which is likely artifactual in etiology. The possibility of a small distal CBD stone is not entirely excluded. Electronically Signed   By: Annia Belt M.D.   On: 09/02/2019 20:58   Dg Chest Port 1 View  Result Date: 09/03/2019 CLINICAL DATA:  Hypoxia. EXAM: PORTABLE CHEST 1 VIEW COMPARISON:  One-view chest x-ray 08/08/2019 FINDINGS: Heart size is exaggerated by low lung volumes. Moderate pulmonary vascular congestion is present. Bilateral pleural effusions and basilar airspace opacities are noted. The visualized soft tissues and bony thorax are unremarkable. IMPRESSION: 1. Stable appearance of bilateral pleural effusions and basilar airspace disease. While this likely reflects atelectasis, infection is not excluded. 2. Moderate pulmonary vascular congestion. Electronically Signed   By: Marin Roberts M.D.   On: 09/03/2019 05:01   Mr Abdomen Mrcp Vivien Rossetti Contast  Result Date: 09/02/2019 CLINICAL DATA:  Patient with possible pancreatitis. Gallbladder wall thickening on prior right upper quadrant ultrasound. EXAM: MRI ABDOMEN WITHOUT AND WITH CONTRAST (INCLUDING MRCP) TECHNIQUE: Multiplanar multisequence MR imaging of the abdomen was performed both before  and after the administration of intravenous contrast. Heavily T2-weighted images of the biliary and pancreatic ducts were obtained,  and three-dimensional MRCP images were rendered by post processing. CONTRAST:  78mL GADAVIST GADOBUTROL 1 MMOL/ML IV SOLN COMPARISON:  Ultrasound abdomen 09/01/2009; CT abdomen pelvis 08/04/2019 FINDINGS: Lower chest: Small bilateral pleural effusions. Consolidative opacities within the lower lungs bilaterally. Hepatobiliary: Liver is normal in size and contour. There is irregular wall thickening of the gallbladder. No cholelithiasis. Possible filling defect within the distal common bile duct on MRCP images (image 30; series 7). No intrahepatic or extrahepatic biliary ductal dilatation. Pancreas:  Unremarkable Spleen:  Unremarkable Adrenals/Urinary Tract: Normal adrenal glands. Kidneys enhance symmetrically with contrast. No hydronephrosis. Stomach/Bowel: Moderate sized hiatal hernia. Otherwise normal morphology of the stomach. No evidence for small bowel obstruction. Vascular/Lymphatic: Normal caliber abdominal aorta. No retroperitoneal lymphadenopathy. Other: There is ascites within the left upper quadrant. Additionally there is perihepatic fluid which appears loculated along the anterior hepatic margin (image 13; series 5). There is patchy enhancement scattered throughout the omentum and colonic mesentery within the central upper abdomen (image 57; series 1202). Musculoskeletal: No aggressive or acute appearing osseous lesions. IMPRESSION: 1. Persistent irregular wall thickening of the gallbladder which is nonspecific in etiology. Findings may be secondary to acute cholecystitis in the appropriate clinical setting. The possibility of gallbladder malignancy is not excluded. 2. There appears to be loculated ascites within the left upper quadrant and within the perihepatic locations, nonspecific in etiology. Additionally, there is patchy enhancement involving the omentum and upper abdominal colonic mesentery. While these findings may be secondary to an acute infectious/inflammatory process, the possibility of omental  carcinomatosis and/or malignant ascites is not entirely excluded, potentially if the gallbladder findings are secondary to a malignancy. 3. Given the multitude of findings, many of which have changed or progressed from prior CT, recommend further evaluation with contrast enhanced CT of the abdomen and pelvis. 4. There is narrowing of the distal aspect of the common bile duct on MRCP images which is likely artifactual in etiology. The possibility of a small distal CBD stone is not entirely excluded. Electronically Signed   By: Lovey Newcomer M.D.   On: 09/02/2019 20:58   US Abdomen Limited Ruq  Result Date: 09/02/2019 CLINICAL DATA:  63 year old male with history of nausea, vomiting and abdominal pain. Under in both to take deep breaths. EXAM: ULTRASOUND ABDOMEN LIMITED RIGHT UPPER QUADRANT COMPARISON:  None. FINDINGS: Gallbladder: No gallstones. Gallbladder wall appears mildly thickened ranging from 5-7 mm in thickness. No pericholecystic fluid. No sonographic Murphy sign noted by sonographer. Common bile duct: Diameter: 3 mm Liver: No focal lesion identified. Within normal limits in parenchymal echogenicity. Portal vein is patent on color Doppler imaging with normal direction of blood flow towards the liver. Other: None. IMPRESSION: 1. Gallbladder wall appears mildly thickened. This is of uncertain etiology and significance. No gallstones or findings to suggest an acute cholecystitis are noted at this time. Follow-up right upper quadrant abdominal ultrasound is suggested in 2 weeks to ensure resolution of this finding. Should this finding not resolve, further evaluation with abdominal MRI with and without IV gadolinium with MRCP would be suggested to exclude the possibility of gallbladder malignancy. Electronically Signed   By: Vinnie Langton M.D.   On: 09/02/2019 13:13    Lab Data:  CBC: Recent Labs  Lab 09/05/19 0326 09/06/19 0351 09/07/19 0341 09/09/19 0354 09/10/19 0249  WBC 9.6 10.7* 10.5  15.9* 15.7*  NEUTROABS 7.8* 9.4*  --   --   --  HGB 12.8* 11.9* 11.3* 12.2* 11.7*  HCT 39.7 38.0* 35.4* 37.7* 35.8*  MCV 95.4 96.9 95.4 93.8 93.2  PLT 332 318 281 316 314   Basic Metabolic Panel: Recent Labs  Lab 09/04/19 0127 09/05/19 0326 09/06/19 0351 09/07/19 0341 09/09/19 0354 09/10/19 0249  NA 134* 136 138 139 137 136  K 4.7 4.2 4.6 3.8 3.6 3.1*  CL 104 105 103 103 99 101  CO2 20* 22 23 26 26 24   GLUCOSE 130* 128* 143* 121* 102* 104*  BUN 47* 31* 25* 19 12 9   CREATININE 1.58* 1.09 0.93 0.77 0.86 0.80  CALCIUM 8.1* 8.0* 7.8* 7.9* 8.2* 7.9*  MG 2.4  --   --   --   --   --    GFR: Estimated Creatinine Clearance: 98.9 mL/min (by C-G formula based on SCr of 0.8 mg/dL). Liver Function Tests: Recent Labs  Lab 09/04/19 0127 09/05/19 0326 09/06/19 0351  AST 18 12* 36  ALT 8 8 26   ALKPHOS 38 33* 38  BILITOT 2.0* 1.4* 1.0  PROT 5.6* 5.4* 5.1*  ALBUMIN 2.7* 2.5* 2.1*   No results for input(s): LIPASE, AMYLASE in the last 168 hours. No results for input(s): AMMONIA in the last 168 hours. Coagulation Profile: No results for input(s): INR, PROTIME in the last 168 hours. Cardiac Enzymes: No results for input(s): CKTOTAL, CKMB, CKMBINDEX, TROPONINI in the last 168 hours. BNP (last 3 results) No results for input(s): PROBNP in the last 8760 hours. HbA1C: No results for input(s): HGBA1C in the last 72 hours. CBG: Recent Labs  Lab 09/07/19 1551 09/07/19 2028 09/08/19 0041 09/08/19 0431 09/08/19 0747  GLUCAP 105* 104* 97 96 94   Lipid Profile: No results for input(s): CHOL, HDL, LDLCALC, TRIG, CHOLHDL, LDLDIRECT in the last 72 hours. Thyroid Function Tests: No results for input(s): TSH, T4TOTAL, FREET4, T3FREE, THYROIDAB in the last 72 hours. Anemia Panel: No results for input(s): VITAMINB12, FOLATE, FERRITIN, TIBC, IRON, RETICCTPCT in the last 72 hours. Urine analysis:    Component Value Date/Time   COLORURINE AMBER (A) 09/02/2019 1631   APPEARANCEUR CLOUDY  (A) 09/02/2019 1631   LABSPEC 1.029 09/02/2019 1631   PHURINE 5.0 09/02/2019 1631   GLUCOSEU 50 (A) 09/02/2019 1631   HGBUR NEGATIVE 09/02/2019 1631   BILIRUBINUR SMALL (A) 09/02/2019 1631   KETONESUR 5 (A) 09/02/2019 1631   PROTEINUR 30 (A) 09/02/2019 1631   UROBILINOGEN 0.2 04/01/2015 1155   NITRITE NEGATIVE 09/02/2019 1631   LEUKOCYTESUR NEGATIVE 09/02/2019 1631     Delcia Spitzley M.D. [facility] 09/10/2019, 12:00 PM   Call night coverage person covering after 7pm

## 2019-09-10 NOTE — Progress Notes (Signed)
Patient ID: Corey Logan, male   DOB: Feb 04, 1956, 63 y.o.   MRN: [id number]    5 Days Post-Op  Subjective: Patient frustrated sounding today.  Tolerating solid diet, but not eating a huge amount at a time.  Passing flatus and having BMs  Denies any significant abdominal pain  ROS: See above, otherwise other systems negative  Objective: Vital signs in last 24 hours: Temp:  [98.5 F (36.9 C)-99 F (37.2 C)] 98.5 F (36.9 C) (12/07 0454) Pulse Rate:  [79-94] 79 (12/07 0454) Resp:  [16-18] 17 (12/07 0454) BP: (142-147)/(79-85) 145/79 (12/07 0454) SpO2:  [96 %-99 %] 96 % (12/07 0454) Last BM Date: 09/08/19  Intake/Output from previous day: 12/06 0701 - 12/07 0700 In: 917 [P.O.:717; IV Piggyback:200] Out: 2000 [Urine:1900; Drains:100] Intake/Output this shift: Total I/O In: 240 [P.O.:240] Out: 630 [Urine:600; Drains:30]  PE: Abd: soft, appropriately tender, incisions are healing well, +BS, left-sided drains with just serous fluid.  Right-sided drain with "slimy" type cloudy tan output.  It is thicker than serous fluid, but not as thick as gelatin per se.   Lab Results:  Recent Labs    09/09/19 0354 09/10/19 0249  WBC 15.9* 15.7*  HGB 12.2* 11.7*  HCT 37.7* 35.8*  PLT 316 314   BMET Recent Labs    09/09/19 0354 09/10/19 0249  NA 137 136  K 3.6 3.1*  CL 99 101  CO2 26 24  GLUCOSE 102* 104*  BUN 12 9  CREATININE 0.86 0.80  CALCIUM 8.2* 7.9*   PT/INR No results for input(s): LABPROT, INR in the last 72 hours. CMP     Component Value Date/Time   NA 136 09/10/2019 0249   K 3.1 (L) 09/10/2019 0249   CL 101 09/10/2019 0249   CO2 24 09/10/2019 0249   GLUCOSE 104 (H) 09/10/2019 0249   BUN 9 09/10/2019 0249   CREATININE 0.80 09/10/2019 0249   CALCIUM 7.9 (L) 09/10/2019 0249   PROT 5.1 (L) 09/06/2019 0351   ALBUMIN 2.1 (L) 09/06/2019 0351   AST 36 09/06/2019 0351   ALT 26 09/06/2019 0351   ALKPHOS 38 09/06/2019 0351   BILITOT 1.0 09/06/2019 0351   GFRNONAA  >60 09/10/2019 0249   GFRAA >60 09/10/2019 0249   Lipase     Component Value Date/Time   LIPASE 55 (H) 09/02/2019 1127       Studies/Results: No results found.  Anti-infectives: Anti-infectives (From admission, onward)   Start     Dose/Rate Route Frequency Ordered Stop   09/04/19 0000  piperacillin-tazobactam (ZOSYN) IVPB 3.375 g     3.375 g 12.5 mL/hr over 240 Minutes Intravenous Every 8 hours 09/03/19 1828     09/03/19 1830  piperacillin-tazobactam (ZOSYN) IVPB 3.375 g     3.375 g 100 mL/hr over 30 Minutes Intravenous STAT 09/03/19 1828 09/03/19 1916       Assessment/Plan H/o heavy ETOH abuse with recent DTs - states he has abstained since recent admission HTN   POD 5, s/p dx lap with drainage of bilious ascites, cholecystectomy, biopsy of appendiceal epiploicum, Dr. Lucia Gaskins 12/2 -unclear etiology still of diffuse ascites, but did not appear to be secondary to the gallbladder - this was removed to take this off table as etiology -significant saponification was noted but this was expected secondary to bilious ascites. -small vascular nodule noted on an appendiceal epiploicum that was biopsied -ALL CX, PATH, ETC, ARE NEGATIVE FOR GROWTH AND NO EVIDENCE OF MALIGNANCY.  -cont reg diet - keep drains in  today as WBC increasing to 15K.  If remains elevated tomorrow will need a CT scan to rule out fluid collection formation, which would not be surprising. -abx to end today, but may need to be extended if WBC remains elevated  FEN -regular diet, SLIV VTE -SQH, SCDs ID -zosyn 12/1-->12/7   LOS: 7 days    Letha Cape , [facility] 09/10/2019, 10:18 AM Please see Amion for pager number during day hours 7:00am-4:30pm

## 2019-09-11 ENCOUNTER — Inpatient Hospital Stay (HOSPITAL_COMMUNITY): Payer: PPO

## 2019-09-11 LAB — CBC
HCT: 37 % — ABNORMAL LOW (ref 39.0–52.0)
Hemoglobin: 12.1 g/dL — ABNORMAL LOW (ref 13.0–17.0)
MCH: 30.5 pg (ref 26.0–34.0)
MCHC: 32.7 g/dL (ref 30.0–36.0)
MCV: 93.2 fL (ref 80.0–100.0)
Platelets: 374 10*3/uL (ref 150–400)
RBC: 3.97 MIL/uL — ABNORMAL LOW (ref 4.22–5.81)
RDW: 12.4 % (ref 11.5–15.5)
WBC: 15.3 10*3/uL — ABNORMAL HIGH (ref 4.0–10.5)
nRBC: 0 % (ref 0.0–0.2)

## 2019-09-11 LAB — BASIC METABOLIC PANEL
Anion gap: 9 (ref 5–15)
BUN: 7 mg/dL — ABNORMAL LOW (ref 8–23)
CO2: 26 mmol/L (ref 22–32)
Calcium: 8.1 mg/dL — ABNORMAL LOW (ref 8.9–10.3)
Chloride: 102 mmol/L (ref 98–111)
Creatinine, Ser: 0.82 mg/dL (ref 0.61–1.24)
GFR calc Af Amer: >60 mL/min (ref >60)
GFR calc non Af Amer: >60 mL/min (ref >60)
Glucose, Bld: 122 mg/dL — ABNORMAL HIGH (ref 70–99)
Potassium: 3.5 mmol/L (ref 3.5–5.1)
Sodium: 137 mmol/L (ref 135–145)

## 2019-09-11 LAB — CULTURE, BODY FLUID W GRAM STAIN -BOTTLE: Culture: NO GROWTH

## 2019-09-11 MED ORDER — IOHEXOL 9 MG/ML PO SOLN
ORAL | Status: AC
  Filled 2019-09-11: qty 500

## 2019-09-11 MED ORDER — HEPARIN SODIUM (PORCINE) 5000 UNIT/ML IJ SOLN
5000.0000 [IU] | Freq: Three times a day (TID) | INTRAMUSCULAR | Status: DC
  Administered 2019-09-12 – 2019-09-13 (×2): 5000 [IU] via SUBCUTANEOUS
  Filled 2019-09-11 (×2): qty 1

## 2019-09-11 MED ORDER — IOHEXOL 9 MG/ML PO SOLN
500.0000 mL | ORAL | Status: AC
  Administered 2019-09-11 (×2): 500 mL via ORAL

## 2019-09-11 MED ORDER — IOHEXOL 300 MG/ML  SOLN
100.0000 mL | Freq: Once | INTRAMUSCULAR | Status: AC | PRN
  Administered 2019-09-11: 100 mL via INTRAVENOUS

## 2019-09-11 MED ORDER — PHENYLEPHRINE HCL-NACL 10-0.9 MG/250ML-% IV SOLN
INTRAVENOUS | Status: AC
  Filled 2019-09-11: qty 750

## 2019-09-11 MED ORDER — PIPERACILLIN-TAZOBACTAM 3.375 G IVPB
3.3750 g | Freq: Three times a day (TID) | INTRAVENOUS | Status: DC
  Administered 2019-09-11 – 2019-09-13 (×5): 3.375 g via INTRAVENOUS
  Filled 2019-09-11 (×4): qty 50

## 2019-09-11 NOTE — Progress Notes (Addendum)
Patient ID: Corey Logan, male   DOB: 11/20/1955, 63 y.o.   MRN: [id number]    6 Days Post-Op  Subjective: Patient feels fine today.  He is frustrated and wants to go home today.   ROS: See above, otherwise other systems negative  Objective: Vital signs in last 24 hours: Temp:  [98.5 F (36.9 C)-99.4 F (37.4 C)] 99.4 F (37.4 C) (12/08 0513) Pulse Rate:  [81-91] 81 (12/08 0513) Resp:  [17-18] 17 (12/08 0513) BP: (124-153)/(72-86) 145/75 (12/08 0513) SpO2:  [96 %-98 %] 96 % (12/08 0513) Last BM Date: 09/08/19  Intake/Output from previous day: 12/07 0701 - 12/08 0700 In: 601 [P.O.:600; IV Piggyback:1] Out: 2210 [Urine:2075; Drains:135] Intake/Output this shift: Total I/O In: -  Out: 400 [Urine:400]  PE: Abd: soft, appropriately tender, incisions are c/d/i with dermabond.  Left-sided drains with minimal serous output.  Right side drain still with thick cloudy output, 110cc yesterday.  Lab Results:  Recent Labs    09/10/19 0249 09/11/19 0307  WBC 15.7* 15.3*  HGB 11.7* 12.1*  HCT 35.8* 37.0*  PLT 314 374   BMET Recent Labs    09/10/19 0249 09/11/19 0307  NA 136 137  K 3.1* 3.5  CL 101 102  CO2 24 26  GLUCOSE 104* 122*  BUN 9 7*  CREATININE 0.80 0.82  CALCIUM 7.9* 8.1*   PT/INR No results for input(s): LABPROT, INR in the last 72 hours. CMP     Component Value Date/Time   NA 137 09/11/2019 0307   K 3.5 09/11/2019 0307   CL 102 09/11/2019 0307   CO2 26 09/11/2019 0307   GLUCOSE 122 (H) 09/11/2019 0307   BUN 7 (L) 09/11/2019 0307   CREATININE 0.82 09/11/2019 0307   CALCIUM 8.1 (L) 09/11/2019 0307   PROT 5.1 (L) 09/06/2019 0351   ALBUMIN 2.1 (L) 09/06/2019 0351   AST 36 09/06/2019 0351   ALT 26 09/06/2019 0351   ALKPHOS 38 09/06/2019 0351   BILITOT 1.0 09/06/2019 0351   GFRNONAA >60 09/11/2019 0307   GFRAA >60 09/11/2019 0307   Lipase     Component Value Date/Time   LIPASE 55 (H) 09/02/2019 1127       Studies/Results: No results  found.  Anti-infectives: Anti-infectives (From admission, onward)   Start     Dose/Rate Route Frequency Ordered Stop   09/04/19 0000  piperacillin-tazobactam (ZOSYN) IVPB 3.375 g     3.375 g 12.5 mL/hr over 240 Minutes Intravenous Every 8 hours 09/03/19 1828 09/10/19 2100   09/03/19 1830  piperacillin-tazobactam (ZOSYN) IVPB 3.375 g     3.375 g 100 mL/hr over 30 Minutes Intravenous STAT 09/03/19 1828 09/03/19 1916       Assessment/Plan H/o heavy ETOH abuse with recent DTs - states he has abstained since recent admission HTN   POD6, s/p dx lap with drainage of bilious ascites, cholecystectomy, biopsy of appendiceal epiploicum, Dr. Lucia Gaskins 12/2 -unclear etiology still of diffuse ascites, but did not appear to be secondary to the gallbladder- this was removed to take this off table as etiology -significant saponification was noted but this was expected secondary to bilious ascites. -small vascular nodule noted on an appendiceal epiploicum that was biopsied -ALL CX, PATH, ETC, ARE NEGATIVE FOR GROWTH AND NO EVIDENCE OF MALIGNANCY.  -WBC still 15K today.  Will order scan to rule out postoperative fluid collection and to evaluate area where RUQ drain is in place.   -will likely plan to remove Left-sided drains today -if patient's scan  is normal, he can probably be discharged.  We had a lengthy discussion again about what was found, what we did, and why he could develop a post op fluid collection.  He initially was refusing his CT scan and wanted to be discharged so he could go to [facility].  I discussed that his original problem, although origin is unknown, has been addressed and he is getting better.  He calmed down and became agreeable to the scan, but still was adamant about going home today if his CT scan is negative.  I told him I thought that was reasonable from a surgery standpoint if his scan is negative.  We discussed if he does have a fluid collection that he will likely need to stay  for a drain to be placed.  He seemed agreeable at the time of our conversation.   FEN -regular diet, SLIV VTE -SQH, SCDs ID -zosyn 12/1-->12/7   LOS: 8 days    Letha Cape , [facility] 09/11/2019, 9:33 AM Please see Amion for pager number during day hours 7:00am-4:30pm

## 2019-09-11 NOTE — Progress Notes (Addendum)
[facility]                                                                              Patient Demographics  Corey Logan, is a 63 y.o. male, DOB - 08/23/1956, UEA:[id number]  Admit date - 09/02/2019   Admitting Physician Therisa Doyne, MD  Outpatient Primary MD for the patient is Elias Else, MD  Outpatient specialists:   LOS - 8  days   Medical records reviewed and are as summarized below:    Chief Complaint  Patient presents with   Abdominal Pain       Brief summary   63 y.o.malewith medical history significant of arthritis, depression, GERD, hiatal hernia, hypertensionalcohol dependence presented to ED on 11/29 with nausea, vomiting, right upper quadrant pain after he ate eggs and steak radiating to his right and left shoulder.States that he quit alcohol and have not had any for the past 1 month. Patient was recently admitted in October with RUQ pain- work-up with CT scan showed gallbladder thickening, USG showed no evidence of gallstones or significant GB wall/ CBD abnormality, HIDA scan showed normal biliary patency and normal ejection fraction. GS evaluated patient and felt that his symptoms were not related to his gallbladder disease-recommended GI eval for EGD. GI series showed small sliding hiatal hernia and GERD, mild esophageal dysmotility.While hospitalized patient developed acute alcohol withdrawal syndrome /encephalopathy with w/u including EEG/CT head.   Hospital course: Patient admitted to [facility] with medical supportive management and GS/GI re-consulted. MRCP reports distal CBD stone vs artifact, GB wall Irregularity,loculated ascites within the left upper quadrant and within the perihepatic locations, nonspecific in etiology. Additionally, patchy enhancement involving the omentum and upper abdominal colonic mesentery-concerning for malignancy-Repeat CT A/P per GS along with CA 19-9, AFP, CA 125 sent. COVID negative-D dimer 3.71.Started on  CLD. HC complicated by persistent nausea/vomiting, AKI requiring IVF and acute hypoxemic respiratory failure-Noted effusions/atelectasis on CXR- CT chest -ve for PE. Repeat HIDA wnl on 12/1. Given persistent symptoms, taken to OR on 12/2. Intraoperative findings consistent with biliary ascites possibly gallbladder source-given gallbladder thickening and possibility of chronic cholecystitis, cholecystectomy was performed as well.  After abdominal irrigation, 3 drains placed.GS following. Awaiting CT abdomen, clearance from surgery for discharge.  COVID-19 test negative  Assessment & Plan     Abdominal pain with nausea vomiting secondary to chronic cholecystitis, bilious ascites with saponification of the omental fat -MRCP showed significant findings of gallbladder wall irregularity, omental thickening and ascites concerning for infectious versus malignancy versus biliary peritonitis. HIDA scan within normal limits -General surgery was consulted, underwent laparoscopic evaluation of bilious ascites, intraoperative cholangiogram (unremarkable), cholecystectomy.  Unclear etiology of diffuse biliary ascites, significant saponification was noted. - Cytology showed reactive mesothelial cells.  Pathology consistent with chronic cholecystitis, left colon epiploicae appendices pathology consistent with benign fibroadipose tissue.  No malignancy. -EGD was unremarkable. -Tumor markers unremarkable CA 19-9 8.0, CA-125 61, AFP 1.1. -WBC plateaued, still 15.3, surgery planning to remove left-sided drains today, ordered CT abdomen to rule out postop fluid collection where RUQ drain is.  No fevers  Acute respiratory failure with hypoxia -Likely due to pleural effusions/atelectasis, BNP  41 -Preoperatively requiring 2 to 3 L of O2 via nasal cannula.  Postoperatively, much improved, sats 98% on room air   Acute kidney injury -Creatinine 0.84 on 08/11/19, creatinine during this admission peaked to 1.68, possibly  ATN versus prerenal due to intractable nausea and vomiting -Creatinine stable 0.8  Hypokalemia -Resolved  History of alcohol dependence with recent episode of alcohol withdrawal -Patient reports abstinence for at least a month now, not in any withdrawals Continue multivitamin, thiamine  GERD/hiatal hernia with esophageal dysmotility -Underwent EGD last admission, continue PPI  Mild normocytic anemia -Hemoglobin 18.0 at the time of admission, likely hemoconcentrated due to dehydration -H&H currently stable, improving from yesterday, 12.2  Elevated BP readings BP stable, continue IV hydralazine as needed with parameters.  Outpatient not on any antihypertensives.    Arthritis Avoid NSAIDs   Code Status: Full code DVT Prophylaxis: Heparin subcu Family Communication: Discussed all imaging results, lab results, explained to the patient    Disposition Plan: Awaiting clearance from surgery regarding disposition/discharge, CT abdomen, drains  Time Spent in minutes 25 minutes  Procedures:  09/05/2019 Diagnostic LAPAROSCOPY, evacuation of bilious ascites (90 minutes for diagnostic lap and evacuation of bilious ascites), UPPER Endoscopy, LAPAROSCOPIC CHOLECYSTECTOMY WITH INTRAOPERATIVE CHOLANGIOGRAM, biopsy of left colon appendices epiploicae [photos at the end of the dictation]    Consultants:   Gastroenterology General surgery  Antimicrobials:   Anti-infectives (From admission, onward)   Start     Dose/Rate Route Frequency Ordered Stop   09/04/19 0000  piperacillin-tazobactam (ZOSYN) IVPB 3.375 g     3.375 g 12.5 mL/hr over 240 Minutes Intravenous Every 8 hours 09/03/19 1828 09/10/19 2100   09/03/19 1830  piperacillin-tazobactam (ZOSYN) IVPB 3.375 g     3.375 g 100 mL/hr over 30 Minutes Intravenous STAT 09/03/19 1828 09/03/19 1916         Medications  Scheduled Meds:  heparin injection (subcutaneous)  5,000 Units Subcutaneous Q8H   iohexol       multivitamin  with minerals  1 tablet Oral Daily   pantoprazole  40 mg Oral Daily   thiamine  100 mg Oral Daily   Continuous Infusions:  phenylephrine     PRN Meds:.acetaminophen **OR** acetaminophen, hydrALAZINE, HYDROmorphone (DILAUDID) injection, labetalol, loperamide, LORazepam, ondansetron **OR** ondansetron (ZOFRAN) IV, oxyCODONE      Subjective:   Alfonso Carden was seen and examined today.  Somewhat frustrated from being in the hospital.  Tolerating diet, no diarrhea.  No fevers or chills. Patient denies dizziness, chest pain, shortness of breath, new weakness, numbess, tingling. No acute events overnight.    Objective:   Vitals:   09/10/19 1401 09/10/19 2036 09/11/19 0513 09/11/19 1315  BP: (!) 153/86 124/72 (!) 145/75 (!) 144/86  Pulse: 89 91 81 87  Resp: Temp: 98.6 F (37 C) 98.5 F (36.9 C) 99.4 F (37.4 C) 98.4 F (36.9 C)  TempSrc: Oral Oral Oral Oral  SpO2: 98% 97% 96% 98%  Weight:      Height:        Intake/Output Summary (Last 24 hours) at 09/11/2019 1347 Last data filed at 09/11/2019 1228 Gross per 24 hour  Intake 1120.98 ml  Output 2580 ml  Net -1459.02 ml     Wt Readings from Last 3 Encounters:  09/03/19 81.9 kg  08/04/19 88.5 kg  12/22/15 93.9 kg    Physical Exam  General: Alert and oriented x 3, NAD  Eyes:   HEENT:  Atraumatic, normocephalic  Cardiovascular: S1 S2  clear,  RRR. No pedal edema b/l  Respiratory: CTAB, no wheezing, rales or rhonchi  Gastrointestinal: Soft, appropriately tender, incisions CDI, drains 3  Ext: no pedal edema bilaterally  Neuro: no new deficits  Musculoskeletal: No cyanosis, clubbing  Skin: No rashes  Psych: Normal affect and demeanor, alert and oriented x3      Data Reviewed:  I have personally reviewed following labs and imaging studies  Micro Results Recent Results (from the past 240 hour(s))  SARS CORONAVIRUS 2 (TAT 6-24 HRS) Nasopharyngeal Nasopharyngeal Swab     Status: None    Collection Time: 09/03/19 12:19 AM   Specimen: Nasopharyngeal Swab  Result Value Ref Range Status   SARS Coronavirus 2 NEGATIVE NEGATIVE Final    Comment: (NOTE) SARS-CoV-2 target nucleic acids are NOT DETECTED. The SARS-CoV-2 RNA is generally detectable in upper and lower respiratory specimens during the acute phase of infection. Negative results do not preclude SARS-CoV-2 infection, do not rule out co-infections with other pathogens, and should not be used as the sole basis for treatment or other patient management decisions. Negative results must be combined with clinical observations, patient history, and epidemiological information. The expected result is Negative. Fact Sheet for Patients: HairSlick.no Fact Sheet for Healthcare Providers: quierodirigir.com This test is not yet approved or cleared by the [place] FDA and  has been authorized for detection and/or diagnosis of SARS-CoV-2 by FDA under an Emergency Use Authorization (EUA). This EUA will remain  in effect (meaning this test can be used) for the duration of the COVID-19 declaration under Section 56 4(b)(1) of the Act, 21 U.S.C. section 360bbb-3(b)(1), unless the authorization is terminated or revoked sooner. Performed at [facility], [address], [place], [place] [address]   Surgical PCR screen     Status: None   Collection Time: 09/04/19  6:04 PM   Specimen: Nasal Mucosa; Nasal Swab  Result Value Ref Range Status   MRSA, PCR NEGATIVE NEGATIVE Final   Staphylococcus aureus NEGATIVE NEGATIVE Final    Comment: (NOTE) The Xpert SA Assay (FDA approved for NASAL specimens in patients 47 years of age and older), is one component of a comprehensive surveillance program. It is not intended to diagnose infection nor to guide or monitor treatment. Performed at [facility], [address] [place], [place] [address]   Fungus Stain      Status: None   Collection Time: 09/05/19  4:56 PM   Specimen: PATH Cytology Peritoneal fluid; Body Fluid  Result Value Ref Range Status   FUNGUS STAIN Final report  Final    Comment: (NOTE) Performed At: [facility] [address] [place], [place] [id number] Jolene Schimke MD WG:[id number]    Fungal Source PERITONEAL  Final    Comment: Performed at [facility], [address] [place], [place] [address]  Culture, body fluid-bottle     Status: None   Collection Time: 09/05/19  4:56 PM   Specimen: Peritoneal Washings  Result Value Ref Range Status   Specimen Description PERITONEAL  Final   Special Requests NONE  Final   Culture   Final    NO GROWTH 5 DAYS Performed at [facility], [address] [place], [place] [address]    Report Status 09/11/2019 FINAL  Final  Gram stain     Status: None   Collection Time: 09/05/19  4:56 PM   Specimen: Peritoneal Washings  Result Value Ref Range Status   Specimen Description PERITONEAL  Final  Special Requests NONE  Final   Gram Stain   Final    FEW WBC PRESENT,BOTH PMN AND MONONUCLEAR NO ORGANISMS SEEN Performed at [facility], [address] [place], [place] [address]    Report Status 09/06/2019 FINAL  Final  Fungal Stain reflex     Status: None   Collection Time: 09/05/19  4:56 PM  Result Value Ref Range Status   Fungal stain result 1 Comment  Final    Comment: (NOTE) KOH/Calcofluor preparation:  no fungus observed. Performed At: [facility] [address] [place], [place] [id number] Jolene Schimke MD XB:[id number]     Radiology Reports Dg Cholangiogram Operative  Result Date: 09/06/2019 CLINICAL DATA:  Gallbladder wall thickening EXAM: INTRAOPERATIVE CHOLANGIOGRAM TECHNIQUE: Cholangiographic images from the C-arm fluoroscopic device were submitted for interpretation post-operatively. Please see the procedural report for the amount of contrast and the fluoroscopy time  utilized. COMPARISON:  CT 09/03/2019 FINDINGS: No persistent filling defects in the common duct. Intrahepatic ducts are incompletely visualized, appearing decompressed centrally. Contrast passes into the duodenum. : Negative for retained common duct stone. Electronically Signed   By: Corlis Leak M.D.   On: 09/06/2019 05:45   Ct Angio Chest Pe W Or Wo Contrast  Result Date: 09/03/2019 CLINICAL DATA:  Abdominal pain, MRI suggesting omental caking. EXAM: CT CHEST CTA protocol of the chest and CT of the ABDOMEN, AND PELVIS WITH CONTRAST TECHNIQUE: Multidetector CT imaging of the chest utilizing CTA, PE protocol, and abdomen and pelvis was performed following the standard protocol during bolus administration of intravenous contrast. Maximum intensity projection reconstructions were generated, submitted and were reviewed. CONTRAST:  80mL OMNIPAQUE IOHEXOL 350 MG/ML SOLN COMPARISON:  MRI 09/02/2019 prior CT assessments dating back to October of 2020 FINDINGS: CT CHEST FINDINGS Cardiovascular: Scattered aortic atherosclerosis. Mild dilation of ascending thoracic aorta 3.9 cm. Heart size is mildly enlarged without pericardial fluid. No signs of pulmonary embolism. Mediastinum/Nodes: No signs of mediastinal lymphadenopathy. Small amount of loculated fluid adjacent to a small hiatal hernia. Fluid is contiguous with hepatic gastric in left subdiaphragmatic fluid. No signs of adenopathy in the chest. Lungs/Pleura: Basilar collapse/consolidation bilaterally. Small bilateral pleural effusions. Airways are patent. Musculoskeletal: No signs of chest wall mass. No signs of acute bone finding or evidence of destructive bone process. No signs of chest wall mass. CT ABDOMEN PELVIS FINDINGS Hepatobiliary: Interval development of extensive perihepatic fluid with scalloping of the a Paddock margin. Fluid extends into the hepatic gastric recess, left subdiaphragmatic space along the right hemi liver and in the right pericolic gutter.  Also tracking into the pelvis density between 15 and 20 Hounsfield units, nonspecific. Diffuse irregular gallbladder wall thickening. Gallbladder is less distended than on the previous exam. No focal mass lesion in the gallbladder. Pancreas: No signs of acute interstitial pancreatitis, ductal dilation or lesion. Spleen: Normal in size without focal abnormality. Adrenals/Urinary Tract: Normal adrenal glands. Symmetric enhancement of the bilateral kidneys. Stomach/Bowel: Small hiatal hernia. Signs of interloop fluid along the jejunum. Developing bowel distension since the MRI of 09/02/2019. Gradual transition in the mid abdomen but with decompressed distal small bowel loops. No signs of pneumatosis. Diffuse mesenteric edema. Vascular/Lymphatic: Calcified and noncalcified atherosclerotic plaque in the abdominal aorta. No signs of aneurysm or acute abdominal vascular abnormality. The portal vein is patent. The splenic vein is patent. No signs of adenopathy in the upper abdomen or retroperitoneum. No signs of pelvic lymphadenopathy. Reproductive: Nonspecific appearance of the prostate by CT. Other: Perihepatic and upper abdominal fluid.  Fluid along the left hepatic margin with scalloping of the left hemi liver measures 8 x 8 cm. Fluid in the left subdiaphragmatic area adjacent the spleen measures 14 by 4 cm. Right flank fluid 6.8 by 2.8 cm. Smaller pockets of fluid along the anterior surface of the liver. Diffuse omental stranding or nodularity. Interloop fluid adjacent to jejunal loops in the left abdomen 5.1 x 2.2 cm. No signs of free air. Musculoskeletal: No signs of acute bone finding or evidence of destructive bone process. IMPRESSION: 1. Interval development of extensive perihepatic fluid with scalloping of the hepatic margin, associated with diffuse ascites, some areas with more loculated appearance and with interloop fluid in the left hemiabdomen. Also associated with irregular omental and peritoneal stranding  along with bowel edema. Constellation of findings could be seen in the setting of peritonitis, rapid development favors this alternative. Secondarily malignant ascites could also have this appearance. Diagnostic paracentesis could be helpful. 2. Marked irregular gallbladder wall thickening, gallbladder is less distended than on the previous exam. Gallbladder compromise cannot be proven on the basis of this study though biliary peritonitis is certainly in the differential based on the appearance of the abdominal fluid. Acute on chronic cholecystitis with gallbladder compromise is considered. Based on gallbladder wall thickening neoplasm remains in the differential. 3. No signs of extraluminal contrast or other findings that would support bowel leak or perforated ulcer as a cause for diffuse peritonitis. 4. No signs of pulmonary embolism. 5. Dense material in collapsed lung at the lung bases likely iodinated contrast, potentially aspirated by this patient with basilar airspace disease and small effusions. Aortic Atherosclerosis (ICD10-I70.0). Electronically Signed   By: Donzetta KohutGeoffrey  Wile M.D.   On: 09/03/2019 18:05   Nm Hepatobiliary Liver Func  Result Date: 09/04/2019 CLINICAL DATA:  Right upper quadrant pain EXAM: NUCLEAR MEDICINE HEPATOBILIARY IMAGING TECHNIQUE: Sequential images of the abdomen were obtained out to 60 minutes following intravenous administration of radiopharmaceutical. RADIOPHARMACEUTICALS:  7.8 mCi Tc-523m  Choletec IV COMPARISON:  CT abdomen and pelvis September 03, 2019 FINDINGS: Prompt uptake and biliary excretion of activity by the liver is seen. Gallbladder activity is visualized, consistent with patency of cystic duct. Biliary activity passes into small bowel, consistent with patent common bile duct. IMPRESSION: Study within normal limits. Electronically Signed   By: Bretta BangWilliam  Woodruff III M.D.   On: 09/04/2019 14:05   Nm Pulmonary Perfusion  Result Date: 09/03/2019 CLINICAL DATA:   Inpatient.  Elevated D-dimer.  Abdominal pain. EXAM: NUCLEAR MEDICINE PERFUSION LUNG SCAN TECHNIQUE: Perfusion images were obtained in multiple projections after intravenous injection of radiopharmaceutical. Ventilation scans were attempted but not tolerated by the patient (patient vomited after breathing apparatus placed). RADIOPHARMACEUTICALS:  1.5 mCi Tc-7323m MAA IV COMPARISON:  Chest radiograph from earlier today. FINDINGS: There are multiple small moderate perfusion defects at posterior lung bases bilaterally, correlating with areas of airspace disease on the chest radiograph from earlier today. IMPRESSION: Perfusion-only scan, see comments. Multiple small to moderate perfusion defects at the posterior lung bases bilaterally with chest radiograph correlates. Findings are considered intermediate probability for acute pulmonary embolism. Electronically Signed   By: Delbert PhenixJason A Poff M.D.   On: 09/03/2019 16:52   Ct Abdomen Pelvis W Contrast  Result Date: 09/03/2019 CLINICAL DATA:  Abdominal pain, MRI suggesting omental caking. EXAM: CT CHEST CTA protocol of the chest and CT of the ABDOMEN, AND PELVIS WITH CONTRAST TECHNIQUE: Multidetector CT imaging of the chest utilizing CTA, PE protocol, and abdomen and pelvis was performed following the standard  protocol during bolus administration of intravenous contrast. Maximum intensity projection reconstructions were generated, submitted and were reviewed. CONTRAST:  66mL OMNIPAQUE IOHEXOL 350 MG/ML SOLN COMPARISON:  MRI 09/02/2019 prior CT assessments dating back to October of 2020 FINDINGS: CT CHEST FINDINGS Cardiovascular: Scattered aortic atherosclerosis. Mild dilation of ascending thoracic aorta 3.9 cm. Heart size is mildly enlarged without pericardial fluid. No signs of pulmonary embolism. Mediastinum/Nodes: No signs of mediastinal lymphadenopathy. Small amount of loculated fluid adjacent to a small hiatal hernia. Fluid is contiguous with hepatic gastric in left  subdiaphragmatic fluid. No signs of adenopathy in the chest. Lungs/Pleura: Basilar collapse/consolidation bilaterally. Small bilateral pleural effusions. Airways are patent. Musculoskeletal: No signs of chest wall mass. No signs of acute bone finding or evidence of destructive bone process. No signs of chest wall mass. CT ABDOMEN PELVIS FINDINGS Hepatobiliary: Interval development of extensive perihepatic fluid with scalloping of the a Paddock margin. Fluid extends into the hepatic gastric recess, left subdiaphragmatic space along the right hemi liver and in the right pericolic gutter. Also tracking into the pelvis density between 15 and 20 Hounsfield units, nonspecific. Diffuse irregular gallbladder wall thickening. Gallbladder is less distended than on the previous exam. No focal mass lesion in the gallbladder. Pancreas: No signs of acute interstitial pancreatitis, ductal dilation or lesion. Spleen: Normal in size without focal abnormality. Adrenals/Urinary Tract: Normal adrenal glands. Symmetric enhancement of the bilateral kidneys. Stomach/Bowel: Small hiatal hernia. Signs of interloop fluid along the jejunum. Developing bowel distension since the MRI of 09/02/2019. Gradual transition in the mid abdomen but with decompressed distal small bowel loops. No signs of pneumatosis. Diffuse mesenteric edema. Vascular/Lymphatic: Calcified and noncalcified atherosclerotic plaque in the abdominal aorta. No signs of aneurysm or acute abdominal vascular abnormality. The portal vein is patent. The splenic vein is patent. No signs of adenopathy in the upper abdomen or retroperitoneum. No signs of pelvic lymphadenopathy. Reproductive: Nonspecific appearance of the prostate by CT. Other: Perihepatic and upper abdominal fluid. Fluid along the left hepatic margin with scalloping of the left hemi liver measures 8 x 8 cm. Fluid in the left subdiaphragmatic area adjacent the spleen measures 14 by 4 cm. Right flank fluid 6.8 by 2.8  cm. Smaller pockets of fluid along the anterior surface of the liver. Diffuse omental stranding or nodularity. Interloop fluid adjacent to jejunal loops in the left abdomen 5.1 x 2.2 cm. No signs of free air. Musculoskeletal: No signs of acute bone finding or evidence of destructive bone process. IMPRESSION: 1. Interval development of extensive perihepatic fluid with scalloping of the hepatic margin, associated with diffuse ascites, some areas with more loculated appearance and with interloop fluid in the left hemiabdomen. Also associated with irregular omental and peritoneal stranding along with bowel edema. Constellation of findings could be seen in the setting of peritonitis, rapid development favors this alternative. Secondarily malignant ascites could also have this appearance. Diagnostic paracentesis could be helpful. 2. Marked irregular gallbladder wall thickening, gallbladder is less distended than on the previous exam. Gallbladder compromise cannot be proven on the basis of this study though biliary peritonitis is certainly in the differential based on the appearance of the abdominal fluid. Acute on chronic cholecystitis with gallbladder compromise is considered. Based on gallbladder wall thickening neoplasm remains in the differential. 3. No signs of extraluminal contrast or other findings that would support bowel leak or perforated ulcer as a cause for diffuse peritonitis. 4. No signs of pulmonary embolism. 5. Dense material in collapsed lung at the lung bases likely iodinated  contrast, potentially aspirated by this patient with basilar airspace disease and small effusions. Aortic Atherosclerosis (ICD10-I70.0). Electronically Signed   By: Donzetta Kohut M.D.   On: 09/03/2019 18:05   Mr 3d Recon At Scanner  Result Date: 09/02/2019 CLINICAL DATA:  Patient with possible pancreatitis. Gallbladder wall thickening on prior right upper quadrant ultrasound. EXAM: MRI ABDOMEN WITHOUT AND WITH CONTRAST  (INCLUDING MRCP) TECHNIQUE: Multiplanar multisequence MR imaging of the abdomen was performed both before and after the administration of intravenous contrast. Heavily T2-weighted images of the biliary and pancreatic ducts were obtained, and three-dimensional MRCP images were rendered by post processing. CONTRAST:  9mL GADAVIST GADOBUTROL 1 MMOL/ML IV SOLN COMPARISON:  Ultrasound abdomen 09/01/2009; CT abdomen pelvis 08/04/2019 FINDINGS: Lower chest: Small bilateral pleural effusions. Consolidative opacities within the lower lungs bilaterally. Hepatobiliary: Liver is normal in size and contour. There is irregular wall thickening of the gallbladder. No cholelithiasis. Possible filling defect within the distal common bile duct on MRCP images (image 30; series 7). No intrahepatic or extrahepatic biliary ductal dilatation. Pancreas:  Unremarkable Spleen:  Unremarkable Adrenals/Urinary Tract: Normal adrenal glands. Kidneys enhance symmetrically with contrast. No hydronephrosis. Stomach/Bowel: Moderate sized hiatal hernia. Otherwise normal morphology of the stomach. No evidence for small bowel obstruction. Vascular/Lymphatic: Normal caliber abdominal aorta. No retroperitoneal lymphadenopathy. Other: There is ascites within the left upper quadrant. Additionally there is perihepatic fluid which appears loculated along the anterior hepatic margin (image 13; series 5). There is patchy enhancement scattered throughout the omentum and colonic mesentery within the central upper abdomen (image 57; series 1202). Musculoskeletal: No aggressive or acute appearing osseous lesions. IMPRESSION: 1. Persistent irregular wall thickening of the gallbladder which is nonspecific in etiology. Findings may be secondary to acute cholecystitis in the appropriate clinical setting. The possibility of gallbladder malignancy is not excluded. 2. There appears to be loculated ascites within the left upper quadrant and within the perihepatic locations,  nonspecific in etiology. Additionally, there is patchy enhancement involving the omentum and upper abdominal colonic mesentery. While these findings may be secondary to an acute infectious/inflammatory process, the possibility of omental carcinomatosis and/or malignant ascites is not entirely excluded, potentially if the gallbladder findings are secondary to a malignancy. 3. Given the multitude of findings, many of which have changed or progressed from prior CT, recommend further evaluation with contrast enhanced CT of the abdomen and pelvis. 4. There is narrowing of the distal aspect of the common bile duct on MRCP images which is likely artifactual in etiology. The possibility of a small distal CBD stone is not entirely excluded. Electronically Signed   By: Annia Belt M.D.   On: 09/02/2019 20:58   Dg Chest Port 1 View  Result Date: 09/03/2019 CLINICAL DATA:  Hypoxia. EXAM: PORTABLE CHEST 1 VIEW COMPARISON:  One-view chest x-ray 08/08/2019 FINDINGS: Heart size is exaggerated by low lung volumes. Moderate pulmonary vascular congestion is present. Bilateral pleural effusions and basilar airspace opacities are noted. The visualized soft tissues and bony thorax are unremarkable. IMPRESSION: 1. Stable appearance of bilateral pleural effusions and basilar airspace disease. While this likely reflects atelectasis, infection is not excluded. 2. Moderate pulmonary vascular congestion. Electronically Signed   By: Marin Roberts M.D.   On: 09/03/2019 05:01   Mr Abdomen Mrcp Vivien Rossetti Contast  Result Date: 09/02/2019 CLINICAL DATA:  Patient with possible pancreatitis. Gallbladder wall thickening on prior right upper quadrant ultrasound. EXAM: MRI ABDOMEN WITHOUT AND WITH CONTRAST (INCLUDING MRCP) TECHNIQUE: Multiplanar multisequence MR imaging of the abdomen was performed both  before and after the administration of intravenous contrast. Heavily T2-weighted images of the biliary and pancreatic ducts were obtained,  and three-dimensional MRCP images were rendered by post processing. CONTRAST:  77mL GADAVIST GADOBUTROL 1 MMOL/ML IV SOLN COMPARISON:  Ultrasound abdomen 09/01/2009; CT abdomen pelvis 08/04/2019 FINDINGS: Lower chest: Small bilateral pleural effusions. Consolidative opacities within the lower lungs bilaterally. Hepatobiliary: Liver is normal in size and contour. There is irregular wall thickening of the gallbladder. No cholelithiasis. Possible filling defect within the distal common bile duct on MRCP images (image 30; series 7). No intrahepatic or extrahepatic biliary ductal dilatation. Pancreas:  Unremarkable Spleen:  Unremarkable Adrenals/Urinary Tract: Normal adrenal glands. Kidneys enhance symmetrically with contrast. No hydronephrosis. Stomach/Bowel: Moderate sized hiatal hernia. Otherwise normal morphology of the stomach. No evidence for small bowel obstruction. Vascular/Lymphatic: Normal caliber abdominal aorta. No retroperitoneal lymphadenopathy. Other: There is ascites within the left upper quadrant. Additionally there is perihepatic fluid which appears loculated along the anterior hepatic margin (image 13; series 5). There is patchy enhancement scattered throughout the omentum and colonic mesentery within the central upper abdomen (image 57; series 1202). Musculoskeletal: No aggressive or acute appearing osseous lesions. IMPRESSION: 1. Persistent irregular wall thickening of the gallbladder which is nonspecific in etiology. Findings may be secondary to acute cholecystitis in the appropriate clinical setting. The possibility of gallbladder malignancy is not excluded. 2. There appears to be loculated ascites within the left upper quadrant and within the perihepatic locations, nonspecific in etiology. Additionally, there is patchy enhancement involving the omentum and upper abdominal colonic mesentery. While these findings may be secondary to an acute infectious/inflammatory process, the possibility of omental  carcinomatosis and/or malignant ascites is not entirely excluded, potentially if the gallbladder findings are secondary to a malignancy. 3. Given the multitude of findings, many of which have changed or progressed from prior CT, recommend further evaluation with contrast enhanced CT of the abdomen and pelvis. 4. There is narrowing of the distal aspect of the common bile duct on MRCP images which is likely artifactual in etiology. The possibility of a small distal CBD stone is not entirely excluded. Electronically Signed   By: Lovey Newcomer M.D.   On: 09/02/2019 20:58   US Abdomen Limited Ruq  Result Date: 09/02/2019 CLINICAL DATA:  63 year old male with history of nausea, vomiting and abdominal pain. Under in both to take deep breaths. EXAM: ULTRASOUND ABDOMEN LIMITED RIGHT UPPER QUADRANT COMPARISON:  None. FINDINGS: Gallbladder: No gallstones. Gallbladder wall appears mildly thickened ranging from 5-7 mm in thickness. No pericholecystic fluid. No sonographic Murphy sign noted by sonographer. Common bile duct: Diameter: 3 mm Liver: No focal lesion identified. Within normal limits in parenchymal echogenicity. Portal vein is patent on color Doppler imaging with normal direction of blood flow towards the liver. Other: None. IMPRESSION: 1. Gallbladder wall appears mildly thickened. This is of uncertain etiology and significance. No gallstones or findings to suggest an acute cholecystitis are noted at this time. Follow-up right upper quadrant abdominal ultrasound is suggested in 2 weeks to ensure resolution of this finding. Should this finding not resolve, further evaluation with abdominal MRI with and without IV gadolinium with MRCP would be suggested to exclude the possibility of gallbladder malignancy. Electronically Signed   By: Vinnie Langton M.D.   On: 09/02/2019 13:13    Lab Data:  CBC: Recent Labs  Lab 09/05/19 0326 09/06/19 0351 09/07/19 0341 09/09/19 0354 09/10/19 0249 09/11/19 0307  WBC 9.6  10.7* 10.5 15.9* 15.7* 15.3*  NEUTROABS 7.8* 9.4*  --   --   --   --  HGB 12.8* 11.9* 11.3* 12.2* 11.7* 12.1*  HCT 39.7 38.0* 35.4* 37.7* 35.8* 37.0*  MCV 95.4 96.9 95.4 93.8 93.2 93.2  PLT 332 318 281 316 314 374   Basic Metabolic Panel: Recent Labs  Lab 09/06/19 0351 09/07/19 0341 09/09/19 0354 09/10/19 0249 09/11/19 0307  NA 138 139 137 136 137  K 4.6 3.8 3.6 3.1* 3.5  CL 103 103 99 101 102  CO2 23 26 26 24 26   GLUCOSE 143* 121* 102* 104* 122*  BUN 25* 19 12 9  7*  CREATININE 0.93 0.77 0.86 0.80 0.82  CALCIUM 7.8* 7.9* 8.2* 7.9* 8.1*   GFR: Estimated Creatinine Clearance: 96.4 mL/min (by C-G formula based on SCr of 0.82 mg/dL). Liver Function Tests: Recent Labs  Lab 09/05/19 0326 09/06/19 0351  AST 12* 36  ALT 8 26  ALKPHOS 33* 38  BILITOT 1.4* 1.0  PROT 5.4* 5.1*  ALBUMIN 2.5* 2.1*   No results for input(s): LIPASE, AMYLASE in the last 168 hours. No results for input(s): AMMONIA in the last 168 hours. Coagulation Profile: No results for input(s): INR, PROTIME in the last 168 hours. Cardiac Enzymes: No results for input(s): CKTOTAL, CKMB, CKMBINDEX, TROPONINI in the last 168 hours. BNP (last 3 results) No results for input(s): PROBNP in the last 8760 hours. HbA1C: No results for input(s): HGBA1C in the last 72 hours. CBG: Recent Labs  Lab 09/07/19 1551 09/07/19 2028 09/08/19 0041 09/08/19 0431 09/08/19 0747  GLUCAP 105* 104* 97 96 94   Lipid Profile: No results for input(s): CHOL, HDL, LDLCALC, TRIG, CHOLHDL, LDLDIRECT in the last 72 hours. Thyroid Function Tests: No results for input(s): TSH, T4TOTAL, FREET4, T3FREE, THYROIDAB in the last 72 hours. Anemia Panel: No results for input(s): VITAMINB12, FOLATE, FERRITIN, TIBC, IRON, RETICCTPCT in the last 72 hours. Urine analysis:    Component Value Date/Time   COLORURINE AMBER (A) 09/02/2019 1631   APPEARANCEUR CLOUDY (A) 09/02/2019 1631   LABSPEC 1.029 09/02/2019 1631   PHURINE 5.0 09/02/2019  1631   GLUCOSEU 50 (A) 09/02/2019 1631   HGBUR NEGATIVE 09/02/2019 1631   BILIRUBINUR SMALL (A) 09/02/2019 1631   KETONESUR 5 (A) 09/02/2019 1631   PROTEINUR 30 (A) 09/02/2019 1631   UROBILINOGEN 0.2 04/01/2015 1155   NITRITE NEGATIVE 09/02/2019 1631   LEUKOCYTESUR NEGATIVE 09/02/2019 1631     Claudius Mich M.D. [facility] 09/11/2019, 1:47 PM   Call night coverage person covering after 7pm

## 2019-09-11 NOTE — Plan of Care (Signed)
  Problem: Clinical Measurements: Goal: Ability to maintain clinical measurements within normal limits will improve Outcome: Progressing Goal: Will remain free from infection Outcome: Progressing Goal: Diagnostic test results will improve Outcome: Progressing Goal: Cardiovascular complication will be avoided Outcome: Progressing   Problem: Nutrition: Goal: Adequate nutrition will be maintained Outcome: Progressing   Problem: Coping: Goal: Level of anxiety will decrease Outcome: Progressing   Problem: Pain Managment: Goal: General experience of comfort will improve Outcome: Progressing   

## 2019-09-12 ENCOUNTER — Inpatient Hospital Stay (HOSPITAL_COMMUNITY): Payer: PPO

## 2019-09-12 DIAGNOSIS — T8143XA Infection following a procedure, organ and space surgical site, initial encounter: Secondary | ICD-10-CM

## 2019-09-12 LAB — COMPREHENSIVE METABOLIC PANEL
ALT: 33 U/L (ref 0–44)
AST: 25 U/L (ref 15–41)
Albumin: 2.2 g/dL — ABNORMAL LOW (ref 3.5–5.0)
Alkaline Phosphatase: 53 U/L (ref 38–126)
Anion gap: 9 (ref 5–15)
BUN: 6 mg/dL — ABNORMAL LOW (ref 8–23)
CO2: 23 mmol/L (ref 22–32)
Calcium: 8.1 mg/dL — ABNORMAL LOW (ref 8.9–10.3)
Chloride: 105 mmol/L (ref 98–111)
Creatinine, Ser: 0.78 mg/dL (ref 0.61–1.24)
GFR calc Af Amer: >60 mL/min (ref >60)
GFR calc non Af Amer: >60 mL/min (ref >60)
Glucose, Bld: 106 mg/dL — ABNORMAL HIGH (ref 70–99)
Potassium: 3.6 mmol/L (ref 3.5–5.1)
Sodium: 137 mmol/L (ref 135–145)
Total Bilirubin: 0.7 mg/dL (ref 0.3–1.2)
Total Protein: 5.6 g/dL — ABNORMAL LOW (ref 6.5–8.1)

## 2019-09-12 LAB — CBC
HCT: 37.6 % — ABNORMAL LOW (ref 39.0–52.0)
Hemoglobin: 12.2 g/dL — ABNORMAL LOW (ref 13.0–17.0)
MCH: 30.3 pg (ref 26.0–34.0)
MCHC: 32.4 g/dL (ref 30.0–36.0)
MCV: 93.3 fL (ref 80.0–100.0)
Platelets: 366 10*3/uL (ref 150–400)
RBC: 4.03 MIL/uL — ABNORMAL LOW (ref 4.22–5.81)
RDW: 12.3 % (ref 11.5–15.5)
WBC: 13.6 10*3/uL — ABNORMAL HIGH (ref 4.0–10.5)
nRBC: 0 % (ref 0.0–0.2)

## 2019-09-12 LAB — PROTIME-INR
INR: 1 (ref 0.8–1.2)
Prothrombin Time: 13.1 seconds (ref 11.4–15.2)

## 2019-09-12 MED ORDER — SODIUM CHLORIDE 0.9% FLUSH
5.0000 mL | Freq: Three times a day (TID) | INTRAVENOUS | Status: DC
  Administered 2019-09-12 – 2019-09-13 (×2): 5 mL

## 2019-09-12 MED ORDER — FENTANYL CITRATE (PF) 100 MCG/2ML IJ SOLN
INTRAMUSCULAR | Status: AC | PRN
  Administered 2019-09-12: 50 ug via INTRAVENOUS

## 2019-09-12 MED ORDER — MIDAZOLAM HCL 2 MG/2ML IJ SOLN
INTRAMUSCULAR | Status: AC | PRN
  Administered 2019-09-12: 1 mg via INTRAVENOUS

## 2019-09-12 MED ORDER — MIDAZOLAM HCL 2 MG/2ML IJ SOLN
INTRAMUSCULAR | Status: AC
  Filled 2019-09-12: qty 2

## 2019-09-12 MED ORDER — SODIUM CHLORIDE 0.9 % IV SOLN
INTRAVENOUS | Status: AC
  Filled 2019-09-12: qty 250

## 2019-09-12 MED ORDER — FENTANYL CITRATE (PF) 100 MCG/2ML IJ SOLN
INTRAMUSCULAR | Status: AC
  Filled 2019-09-12: qty 2

## 2019-09-12 NOTE — Progress Notes (Addendum)
[facility]                                                                              Patient Demographics  Corey Logan, is a 63 y.o. male, DOB - 11/07/1955, UJW:[id number]  Admit date - 09/02/2019   Admitting Physician Therisa Doyne, MD  Outpatient Primary MD for the patient is Elias Else, MD  Outpatient specialists:   LOS - 9  days   Medical records reviewed and are as summarized below:    Chief Complaint  Patient presents with   Abdominal Pain       Brief summary   63 y.o.malewith medical history significant of arthritis, depression, GERD, hiatal hernia, hypertensionalcohol dependence presented to ED on 11/29 with nausea, vomiting, right upper quadrant pain. MRCP reports distal CBD stone vs artifact, GB wall Irregularity,loculated ascites within the left upper quadrant and within the perihepatic locations, nonspecific in etiology. Additionally, patchy enhancement involving the omentum and upper abdominal colonic mesentery-concerning for malignancy-Repeat CT A/P per GS along with CA 19-9, AFP, CA 125 sent, all negative. Repeat HIDA wnl on 12/1. Given persistent symptoms, taken to OR on 12/2. Intraoperative findings consistent with biliary ascites possibly gallbladder source-given gallbladder thickening and possibility of chronic cholecystitis, cholecystectomy was performed as well.  After abdominal irrigation, 3 drains placed and subsequently removed by GS. Due to persistent WBC, repeat CT abdomen showed slightly enlarged fluid collection concerning for possible abscess or seroma, 13.6 x 7.4 cm fluid collection in the left subdiaphragmatic area adjacent to the spleen, with innumerable small fluid collections concerning for multiple small abscesses or seromas.  IR consulted for repeat drain placement on 09/12/2019  Assessment & Plan     Chronic cholecystitis, bilious ascites with saponification of the omental fat s/p cholecystectomy, complicated by  abdominal abscess Currently afebrile, with leukocytosis General surgery was consulted, underwent laparoscopic evaluation of bilious ascites, intraoperative cholangiogram (unremarkable), cholecystectomy.  Unclear etiology of diffuse biliary ascites, significant saponification was noted. Cytology showed reactive mesothelial cells.  Pathology consistent with chronic cholecystitis, left colon epiploicae appendices pathology consistent with benign fibroadipose tissue.  No malignancy EGD was unremarkable. Tumor markers unremarkable CA 19-9 8.0, CA-125 61, AFP 1.1. Repeat CT abdomen done on 09/11/19 showed slightly enlarged fluid collection concerning for possible abscess or seroma, 13.6 x 7.4 cm fluid collection in the left subdiaphragmatic area adjacent to the spleen, with innumerable small fluid collections concerning for multiple small abscesses or seromas IR consulted for repeat drain placement on 09/12/2019 Continue IV Zosyn  History of alcohol dependence with recent episode of alcohol withdrawal Patient reports abstinence for at least a month now, not in any withdrawals Continue multivitamin, thiamine  GERD/hiatal hernia with esophageal dysmotility Underwent EGD last admission, continue PPI  Elevated BP readings BP stable, continue IV hydralazine as needed with parameters Outpatient not on any antihypertensives.      Code Status: Full code DVT Prophylaxis: Heparin subcu Family Communication: None at bedside   Disposition Plan: TBD   Procedures:  09/05/2019 Diagnostic LAPAROSCOPY, evacuation of bilious ascites (90 minutes for diagnostic lap and evacuation of bilious ascites), UPPER Endoscopy, LAPAROSCOPIC CHOLECYSTECTOMY WITH INTRAOPERATIVE CHOLANGIOGRAM, biopsy of  left colon appendices epiploicae [photos at the end of the dictation]    Consultants:   Gastroenterology General surgery  Antimicrobials:   Anti-infectives (From admission, onward)   Start     Dose/Rate Route  Frequency Ordered Stop   09/11/19 1800  piperacillin-tazobactam (ZOSYN) IVPB 3.375 g     3.375 g 12.5 mL/hr over 240 Minutes Intravenous Every 8 hours 09/11/19 1648 09/18/19 1759   09/04/19 0000  piperacillin-tazobactam (ZOSYN) IVPB 3.375 g     3.375 g 12.5 mL/hr over 240 Minutes Intravenous Every 8 hours 09/03/19 1828 09/10/19 2100   09/03/19 1830  piperacillin-tazobactam (ZOSYN) IVPB 3.375 g     3.375 g 100 mL/hr over 30 Minutes Intravenous STAT 09/03/19 1828 09/03/19 1916         Medications  Scheduled Meds:  heparin injection (subcutaneous)  5,000 Units Subcutaneous Q8H   multivitamin with minerals  1 tablet Oral Daily   pantoprazole  40 mg Oral Daily   thiamine  100 mg Oral Daily   Continuous Infusions:  piperacillin-tazobactam (ZOSYN)  IV 3.375 g (09/12/19 0920)   PRN Meds:.acetaminophen **OR** acetaminophen, hydrALAZINE, HYDROmorphone (DILAUDID) injection, labetalol, loperamide, LORazepam, ondansetron **OR** ondansetron (ZOFRAN) IV, oxyCODONE      Subjective:  Patient seen and examined at bedside, reports feeling slightly better, denies any worsening abdominal pain, chest pain, shortness of breath, nausea/vomiting/diarrhea, fever/chills.     Objective:   Vitals:   09/11/19 1315 09/11/19 2133 09/12/19 0502 09/12/19 1250  BP: (!) 144/86 123/73 129/79 121/76  Pulse: 87 91 84 90  Resp: Temp: 98.4 F (36.9 C) 98.3 F (36.8 C) 98.8 F (37.1 C) 98.4 F (36.9 C)  TempSrc: Oral Oral Oral Oral  SpO2: 98% 98% 98% 97%  Weight:      Height:        Intake/Output Summary (Last 24 hours) at 09/12/2019 1522 Last data filed at 09/12/2019 1100 Gross per 24 hour  Intake 66.19 ml  Output 860 ml  Net -793.81 ml     Wt Readings from Last 3 Encounters:  09/03/19 81.9 kg  08/04/19 88.5 kg  12/22/15 93.9 kg    Physical Exam  General: NAD   Cardiovascular: S1, S2 present  Respiratory: CTAB  Abdomen: Soft, mildly tender, dressing C/D/I,  nondistended, bowel sounds present  Musculoskeletal: No bilateral pedal edema noted  Skin: Normal  Psychiatry: Normal mood     Data Reviewed:  I have personally reviewed following labs and imaging studies  Micro Results Recent Results (from the past 240 hour(s))  SARS CORONAVIRUS 2 (TAT 6-24 HRS) Nasopharyngeal Nasopharyngeal Swab     Status: None   Collection Time: 09/03/19 12:19 AM   Specimen: Nasopharyngeal Swab  Result Value Ref Range Status   SARS Coronavirus 2 NEGATIVE NEGATIVE Final    Comment: (NOTE) SARS-CoV-2 target nucleic acids are NOT DETECTED. The SARS-CoV-2 RNA is generally detectable in upper and lower respiratory specimens during the acute phase of infection. Negative results do not preclude SARS-CoV-2 infection, do not rule out co-infections with other pathogens, and should not be used as the sole basis for treatment or other patient management decisions. Negative results must be combined with clinical observations, patient history, and epidemiological information. The expected result is Negative. Fact Sheet for Patients: HairSlick.no Fact Sheet for Healthcare Providers: quierodirigir.com This test is not yet approved or cleared by the [place] FDA and  has been authorized for detection and/or diagnosis of SARS-CoV-2 by FDA under an Emergency Use Authorization (  EUA). This EUA will remain  in effect (meaning this test can be used) for the duration of the COVID-19 declaration under Section 56 4(b)(1) of the Act, 21 U.S.C. section 360bbb-3(b)(1), unless the authorization is terminated or revoked sooner. Performed at [facility], [address] [place], [place] [address]   Surgical PCR screen     Status: None   Collection Time: 09/04/19  6:04 PM   Specimen: Nasal Mucosa; Nasal Swab  Result Value Ref Range Status   MRSA, PCR NEGATIVE NEGATIVE Final   Staphylococcus aureus NEGATIVE NEGATIVE  Final    Comment: (NOTE) The Xpert SA Assay (FDA approved for NASAL specimens in patients 62 years of age and older), is one component of a comprehensive surveillance program. It is not intended to diagnose infection nor to guide or monitor treatment. Performed at [facility], [address] [place], [place] [address]   Fungus Stain     Status: None   Collection Time: 09/05/19  4:56 PM   Specimen: PATH Cytology Peritoneal fluid; Body Fluid  Result Value Ref Range Status   FUNGUS STAIN Final report  Final    Comment: (NOTE) Performed At: [facility] [address] [place], [place] [id number] Jolene SchimkeNagendra Sanjai MD LK:[id number]:[phone]    Fungal Source PERITONEAL  Final    Comment: Performed at [facility], [address] [place], [place] [address]  Culture, body fluid-bottle     Status: None   Collection Time: 09/05/19  4:56 PM   Specimen: Peritoneal Washings  Result Value Ref Range Status   Specimen Description PERITONEAL  Final   Special Requests NONE  Final   Culture   Final    NO GROWTH 5 DAYS Performed at [facility], [address] [place], [place] [address]    Report Status 09/11/2019 FINAL  Final  Gram stain     Status: None   Collection Time: 09/05/19  4:56 PM   Specimen: Peritoneal Washings  Result Value Ref Range Status   Specimen Description PERITONEAL  Final   Special Requests NONE  Final   Gram Stain   Final    FEW WBC PRESENT,BOTH PMN AND MONONUCLEAR NO ORGANISMS SEEN Performed at [facility], [address] [place], [place] [address]    Report Status 09/06/2019 FINAL  Final  Fungal Stain reflex     Status: None   Collection Time: 09/05/19  4:56 PM  Result Value Ref Range Status   Fungal stain result 1 Comment  Final    Comment: (NOTE) KOH/Calcofluor preparation:  no fungus observed. Performed At: [facility] [address] [place], [place] [id number] Jolene SchimkeNagendra Sanjai MD JJ:[id number]:[phone]      Radiology Reports Dg Cholangiogram Operative  Result Date: 09/06/2019 CLINICAL DATA:  Gallbladder wall thickening EXAM: INTRAOPERATIVE CHOLANGIOGRAM TECHNIQUE: Cholangiographic images from the C-arm fluoroscopic device were submitted for interpretation post-operatively. Please see the procedural report for the amount of contrast and the fluoroscopy time utilized. COMPARISON:  CT 09/03/2019 FINDINGS: No persistent filling defects in the common duct. Intrahepatic ducts are incompletely visualized, appearing decompressed centrally. Contrast passes into the duodenum. : Negative for retained common duct stone. Electronically Signed   By: Corlis Leak  Hassell M.D.   On: 09/06/2019 05:45   Ct Angio Chest Pe W Or Wo Contrast  Result Date: 09/03/2019 CLINICAL DATA:  Abdominal pain, MRI suggesting omental caking. EXAM: CT CHEST CTA protocol of the chest and CT of the ABDOMEN, AND PELVIS WITH CONTRAST  TECHNIQUE: Multidetector CT imaging of the chest utilizing CTA, PE protocol, and abdomen and pelvis was performed following the standard protocol during bolus administration of intravenous contrast. Maximum intensity projection reconstructions were generated, submitted and were reviewed. CONTRAST:  4mL OMNIPAQUE IOHEXOL 350 MG/ML SOLN COMPARISON:  MRI 09/02/2019 prior CT assessments dating back to October of 2020 FINDINGS: CT CHEST FINDINGS Cardiovascular: Scattered aortic atherosclerosis. Mild dilation of ascending thoracic aorta 3.9 cm. Heart size is mildly enlarged without pericardial fluid. No signs of pulmonary embolism. Mediastinum/Nodes: No signs of mediastinal lymphadenopathy. Small amount of loculated fluid adjacent to a small hiatal hernia. Fluid is contiguous with hepatic gastric in left subdiaphragmatic fluid. No signs of adenopathy in the chest. Lungs/Pleura: Basilar collapse/consolidation bilaterally. Small bilateral pleural effusions. Airways are patent. Musculoskeletal: No signs of chest wall mass. No signs  of acute bone finding or evidence of destructive bone process. No signs of chest wall mass. CT ABDOMEN PELVIS FINDINGS Hepatobiliary: Interval development of extensive perihepatic fluid with scalloping of the a Paddock margin. Fluid extends into the hepatic gastric recess, left subdiaphragmatic space along the right hemi liver and in the right pericolic gutter. Also tracking into the pelvis density between 15 and 20 Hounsfield units, nonspecific. Diffuse irregular gallbladder wall thickening. Gallbladder is less distended than on the previous exam. No focal mass lesion in the gallbladder. Pancreas: No signs of acute interstitial pancreatitis, ductal dilation or lesion. Spleen: Normal in size without focal abnormality. Adrenals/Urinary Tract: Normal adrenal glands. Symmetric enhancement of the bilateral kidneys. Stomach/Bowel: Small hiatal hernia. Signs of interloop fluid along the jejunum. Developing bowel distension since the MRI of 09/02/2019. Gradual transition in the mid abdomen but with decompressed distal small bowel loops. No signs of pneumatosis. Diffuse mesenteric edema. Vascular/Lymphatic: Calcified and noncalcified atherosclerotic plaque in the abdominal aorta. No signs of aneurysm or acute abdominal vascular abnormality. The portal vein is patent. The splenic vein is patent. No signs of adenopathy in the upper abdomen or retroperitoneum. No signs of pelvic lymphadenopathy. Reproductive: Nonspecific appearance of the prostate by CT. Other: Perihepatic and upper abdominal fluid. Fluid along the left hepatic margin with scalloping of the left hemi liver measures 8 x 8 cm. Fluid in the left subdiaphragmatic area adjacent the spleen measures 14 by 4 cm. Right flank fluid 6.8 by 2.8 cm. Smaller pockets of fluid along the anterior surface of the liver. Diffuse omental stranding or nodularity. Interloop fluid adjacent to jejunal loops in the left abdomen 5.1 x 2.2 cm. No signs of free air. Musculoskeletal: No  signs of acute bone finding or evidence of destructive bone process. IMPRESSION: 1. Interval development of extensive perihepatic fluid with scalloping of the hepatic margin, associated with diffuse ascites, some areas with more loculated appearance and with interloop fluid in the left hemiabdomen. Also associated with irregular omental and peritoneal stranding along with bowel edema. Constellation of findings could be seen in the setting of peritonitis, rapid development favors this alternative. Secondarily malignant ascites could also have this appearance. Diagnostic paracentesis could be helpful. 2. Marked irregular gallbladder wall thickening, gallbladder is less distended than on the previous exam. Gallbladder compromise cannot be proven on the basis of this study though biliary peritonitis is certainly in the differential based on the appearance of the abdominal fluid. Acute on chronic cholecystitis with gallbladder compromise is considered. Based on gallbladder wall thickening neoplasm remains in the differential. 3. No signs of extraluminal contrast or other findings that would support bowel leak or perforated ulcer as a cause for  diffuse peritonitis. 4. No signs of pulmonary embolism. 5. Dense material in collapsed lung at the lung bases likely iodinated contrast, potentially aspirated by this patient with basilar airspace disease and small effusions. Aortic Atherosclerosis (ICD10-I70.0). Electronically Signed   By: Donzetta Kohut M.D.   On: 09/03/2019 18:05   Nm Hepatobiliary Liver Func  Result Date: 09/04/2019 CLINICAL DATA:  Right upper quadrant pain EXAM: NUCLEAR MEDICINE HEPATOBILIARY IMAGING TECHNIQUE: Sequential images of the abdomen were obtained out to 60 minutes following intravenous administration of radiopharmaceutical. RADIOPHARMACEUTICALS:  7.8 mCi Tc-59m  Choletec IV COMPARISON:  CT abdomen and pelvis September 03, 2019 FINDINGS: Prompt uptake and biliary excretion of activity by the liver  is seen. Gallbladder activity is visualized, consistent with patency of cystic duct. Biliary activity passes into small bowel, consistent with patent common bile duct. IMPRESSION: Study within normal limits. Electronically Signed   By: Bretta Bang III M.D.   On: 09/04/2019 14:05   Nm Pulmonary Perfusion  Result Date: 09/03/2019 CLINICAL DATA:  Inpatient.  Elevated D-dimer.  Abdominal pain. EXAM: NUCLEAR MEDICINE PERFUSION LUNG SCAN TECHNIQUE: Perfusion images were obtained in multiple projections after intravenous injection of radiopharmaceutical. Ventilation scans were attempted but not tolerated by the patient (patient vomited after breathing apparatus placed). RADIOPHARMACEUTICALS:  1.5 mCi Tc-54m MAA IV COMPARISON:  Chest radiograph from earlier today. FINDINGS: There are multiple small moderate perfusion defects at posterior lung bases bilaterally, correlating with areas of airspace disease on the chest radiograph from earlier today. IMPRESSION: Perfusion-only scan, see comments. Multiple small to moderate perfusion defects at the posterior lung bases bilaterally with chest radiograph correlates. Findings are considered intermediate probability for acute pulmonary embolism. Electronically Signed   By: Delbert Phenix M.D.   On: 09/03/2019 16:52   Ct Abdomen Pelvis W Contrast  Result Date: 09/11/2019 CLINICAL DATA:  Abdominal pain. EXAM: CT ABDOMEN AND PELVIS WITH CONTRAST TECHNIQUE: Multidetector CT imaging of the abdomen and pelvis was performed using the standard protocol following bolus administration of intravenous contrast. CONTRAST:  OMNIPAQUE IOHEXOL 300 MG/ML  SOLN COMPARISON:  September 03, 2019. FINDINGS: Lower chest: Minimal bibasilar subsegmental atelectasis is noted. Hepatobiliary: Status post cholecystectomy. There remains a minimal amount of perihepatic fluid. There is interval placement of surgical drain into the infrahepatic region. Pancreas: Unremarkable. No pancreatic ductal  dilatation or surrounding inflammatory changes. Spleen: Spleen appears normal, although there is continued presence of 13.6 x 7.4 cm fluid collection with enhancing margins concerning for possible abscess or seroma. It appears to be enlarged compared to prior exam. Adrenals/Urinary Tract: Adrenal glands are unremarkable. Kidneys are normal, without renal calculi, focal lesion, or hydronephrosis. Bladder is unremarkable. Stomach/Bowel: Small sliding-type hiatal hernia is noted. Stomach is unremarkable. There is no evidence of bowel obstruction or inflammation. Status post appendectomy. Vascular/Lymphatic: Aortic atherosclerosis. No enlarged abdominal or pelvic lymph nodes. Reproductive: Prostate is unremarkable. Other: Also noted is surgical drain entering the left side the abdomen extending into the pelvis. Another surgical drain is seen entering the left upper quadrant which extends through fluid collection in epigastric region adjacent to left hepatic lobe and above the stomach. This measures 10.9 x 7.8 cm and is slightly enlarged compared to prior exam. It also demonstrates enhancing margins suggesting possible developing abscess or seroma. There are multiple other smaller fluid collection seen in the epigastric region anteriorly with enhancing walls concerning for possible small abscesses. Musculoskeletal: No acute or significant osseous findings. IMPRESSION: Status post cholecystectomy. Interval placement of surgical drain and infrahepatic region with decreased  amount of perihepatic fluid compared to prior exam. There is also been interval placement of a no other surgical drain in the left upper quadrant with distal tip passing through fluid collection in epigastric region between left hepatic lobe and stomach. This fluid collection is slightly enlarged with enhancing margin concerning for possible abscess or seroma. Also noted is 13.6 x 7.4 cm fluid collection in the left subdiaphragmatic placed adjacent to  the spleen with enhancing margins which is in large compared to prior exam. This also is concerning for possible abscess or seroma. There is also the development of innumerable small fluid collections in the anterior portion of the epigastric region with enhancing walls concerning for multiple small abscesses or seromas. Aortic Atherosclerosis (ICD10-I70.0). Electronically Signed   By: Lupita Raider M.D.   On: 09/11/2019 17:10   Ct Abdomen Pelvis W Contrast  Result Date: 09/03/2019 CLINICAL DATA:  Abdominal pain, MRI suggesting omental caking. EXAM: CT CHEST CTA protocol of the chest and CT of the ABDOMEN, AND PELVIS WITH CONTRAST TECHNIQUE: Multidetector CT imaging of the chest utilizing CTA, PE protocol, and abdomen and pelvis was performed following the standard protocol during bolus administration of intravenous contrast. Maximum intensity projection reconstructions were generated, submitted and were reviewed. CONTRAST:  56mL OMNIPAQUE IOHEXOL 350 MG/ML SOLN COMPARISON:  MRI 09/02/2019 prior CT assessments dating back to October of 2020 FINDINGS: CT CHEST FINDINGS Cardiovascular: Scattered aortic atherosclerosis. Mild dilation of ascending thoracic aorta 3.9 cm. Heart size is mildly enlarged without pericardial fluid. No signs of pulmonary embolism. Mediastinum/Nodes: No signs of mediastinal lymphadenopathy. Small amount of loculated fluid adjacent to a small hiatal hernia. Fluid is contiguous with hepatic gastric in left subdiaphragmatic fluid. No signs of adenopathy in the chest. Lungs/Pleura: Basilar collapse/consolidation bilaterally. Small bilateral pleural effusions. Airways are patent. Musculoskeletal: No signs of chest wall mass. No signs of acute bone finding or evidence of destructive bone process. No signs of chest wall mass. CT ABDOMEN PELVIS FINDINGS Hepatobiliary: Interval development of extensive perihepatic fluid with scalloping of the a Paddock margin. Fluid extends into the hepatic  gastric recess, left subdiaphragmatic space along the right hemi liver and in the right pericolic gutter. Also tracking into the pelvis density between 15 and 20 Hounsfield units, nonspecific. Diffuse irregular gallbladder wall thickening. Gallbladder is less distended than on the previous exam. No focal mass lesion in the gallbladder. Pancreas: No signs of acute interstitial pancreatitis, ductal dilation or lesion. Spleen: Normal in size without focal abnormality. Adrenals/Urinary Tract: Normal adrenal glands. Symmetric enhancement of the bilateral kidneys. Stomach/Bowel: Small hiatal hernia. Signs of interloop fluid along the jejunum. Developing bowel distension since the MRI of 09/02/2019. Gradual transition in the mid abdomen but with decompressed distal small bowel loops. No signs of pneumatosis. Diffuse mesenteric edema. Vascular/Lymphatic: Calcified and noncalcified atherosclerotic plaque in the abdominal aorta. No signs of aneurysm or acute abdominal vascular abnormality. The portal vein is patent. The splenic vein is patent. No signs of adenopathy in the upper abdomen or retroperitoneum. No signs of pelvic lymphadenopathy. Reproductive: Nonspecific appearance of the prostate by CT. Other: Perihepatic and upper abdominal fluid. Fluid along the left hepatic margin with scalloping of the left hemi liver measures 8 x 8 cm. Fluid in the left subdiaphragmatic area adjacent the spleen measures 14 by 4 cm. Right flank fluid 6.8 by 2.8 cm. Smaller pockets of fluid along the anterior surface of the liver. Diffuse omental stranding or nodularity. Interloop fluid adjacent to jejunal loops in the left abdomen  5.1 x 2.2 cm. No signs of free air. Musculoskeletal: No signs of acute bone finding or evidence of destructive bone process. IMPRESSION: 1. Interval development of extensive perihepatic fluid with scalloping of the hepatic margin, associated with diffuse ascites, some areas with more loculated appearance and with  interloop fluid in the left hemiabdomen. Also associated with irregular omental and peritoneal stranding along with bowel edema. Constellation of findings could be seen in the setting of peritonitis, rapid development favors this alternative. Secondarily malignant ascites could also have this appearance. Diagnostic paracentesis could be helpful. 2. Marked irregular gallbladder wall thickening, gallbladder is less distended than on the previous exam. Gallbladder compromise cannot be proven on the basis of this study though biliary peritonitis is certainly in the differential based on the appearance of the abdominal fluid. Acute on chronic cholecystitis with gallbladder compromise is considered. Based on gallbladder wall thickening neoplasm remains in the differential. 3. No signs of extraluminal contrast or other findings that would support bowel leak or perforated ulcer as a cause for diffuse peritonitis. 4. No signs of pulmonary embolism. 5. Dense material in collapsed lung at the lung bases likely iodinated contrast, potentially aspirated by this patient with basilar airspace disease and small effusions. Aortic Atherosclerosis (ICD10-I70.0). Electronically Signed   By: Donzetta Kohut M.D.   On: 09/03/2019 18:05   Mr 3d Recon At Scanner  Result Date: 09/02/2019 CLINICAL DATA:  Patient with possible pancreatitis. Gallbladder wall thickening on prior right upper quadrant ultrasound. EXAM: MRI ABDOMEN WITHOUT AND WITH CONTRAST (INCLUDING MRCP) TECHNIQUE: Multiplanar multisequence MR imaging of the abdomen was performed both before and after the administration of intravenous contrast. Heavily T2-weighted images of the biliary and pancreatic ducts were obtained, and three-dimensional MRCP images were rendered by post processing. CONTRAST:  9mL GADAVIST GADOBUTROL 1 MMOL/ML IV SOLN COMPARISON:  Ultrasound abdomen 09/01/2009; CT abdomen pelvis 08/04/2019 FINDINGS: Lower chest: Small bilateral pleural effusions.  Consolidative opacities within the lower lungs bilaterally. Hepatobiliary: Liver is normal in size and contour. There is irregular wall thickening of the gallbladder. No cholelithiasis. Possible filling defect within the distal common bile duct on MRCP images (image 30; series 7). No intrahepatic or extrahepatic biliary ductal dilatation. Pancreas:  Unremarkable Spleen:  Unremarkable Adrenals/Urinary Tract: Normal adrenal glands. Kidneys enhance symmetrically with contrast. No hydronephrosis. Stomach/Bowel: Moderate sized hiatal hernia. Otherwise normal morphology of the stomach. No evidence for small bowel obstruction. Vascular/Lymphatic: Normal caliber abdominal aorta. No retroperitoneal lymphadenopathy. Other: There is ascites within the left upper quadrant. Additionally there is perihepatic fluid which appears loculated along the anterior hepatic margin (image 13; series 5). There is patchy enhancement scattered throughout the omentum and colonic mesentery within the central upper abdomen (image 57; series 1202). Musculoskeletal: No aggressive or acute appearing osseous lesions. IMPRESSION: 1. Persistent irregular wall thickening of the gallbladder which is nonspecific in etiology. Findings may be secondary to acute cholecystitis in the appropriate clinical setting. The possibility of gallbladder malignancy is not excluded. 2. There appears to be loculated ascites within the left upper quadrant and within the perihepatic locations, nonspecific in etiology. Additionally, there is patchy enhancement involving the omentum and upper abdominal colonic mesentery. While these findings may be secondary to an acute infectious/inflammatory process, the possibility of omental carcinomatosis and/or malignant ascites is not entirely excluded, potentially if the gallbladder findings are secondary to a malignancy. 3. Given the multitude of findings, many of which have changed or progressed from prior CT, recommend further  evaluation with contrast enhanced CT of the  abdomen and pelvis. 4. There is narrowing of the distal aspect of the common bile duct on MRCP images which is likely artifactual in etiology. The possibility of a small distal CBD stone is not entirely excluded. Electronically Signed   By: Annia Belt M.D.   On: 09/02/2019 20:58   Dg Chest Port 1 View  Result Date: 09/03/2019 CLINICAL DATA:  Hypoxia. EXAM: PORTABLE CHEST 1 VIEW COMPARISON:  One-view chest x-ray 08/08/2019 FINDINGS: Heart size is exaggerated by low lung volumes. Moderate pulmonary vascular congestion is present. Bilateral pleural effusions and basilar airspace opacities are noted. The visualized soft tissues and bony thorax are unremarkable. IMPRESSION: 1. Stable appearance of bilateral pleural effusions and basilar airspace disease. While this likely reflects atelectasis, infection is not excluded. 2. Moderate pulmonary vascular congestion. Electronically Signed   By: Marin Roberts M.D.   On: 09/03/2019 05:01   Mr Abdomen Mrcp Vivien Rossetti Contast  Result Date: 09/02/2019 CLINICAL DATA:  Patient with possible pancreatitis. Gallbladder wall thickening on prior right upper quadrant ultrasound. EXAM: MRI ABDOMEN WITHOUT AND WITH CONTRAST (INCLUDING MRCP) TECHNIQUE: Multiplanar multisequence MR imaging of the abdomen was performed both before and after the administration of intravenous contrast. Heavily T2-weighted images of the biliary and pancreatic ducts were obtained, and three-dimensional MRCP images were rendered by post processing. CONTRAST:  9mL GADAVIST GADOBUTROL 1 MMOL/ML IV SOLN COMPARISON:  Ultrasound abdomen 09/01/2009; CT abdomen pelvis 08/04/2019 FINDINGS: Lower chest: Small bilateral pleural effusions. Consolidative opacities within the lower lungs bilaterally. Hepatobiliary: Liver is normal in size and contour. There is irregular wall thickening of the gallbladder. No cholelithiasis. Possible filling defect within the distal common  bile duct on MRCP images (image 30; series 7). No intrahepatic or extrahepatic biliary ductal dilatation. Pancreas:  Unremarkable Spleen:  Unremarkable Adrenals/Urinary Tract: Normal adrenal glands. Kidneys enhance symmetrically with contrast. No hydronephrosis. Stomach/Bowel: Moderate sized hiatal hernia. Otherwise normal morphology of the stomach. No evidence for small bowel obstruction. Vascular/Lymphatic: Normal caliber abdominal aorta. No retroperitoneal lymphadenopathy. Other: There is ascites within the left upper quadrant. Additionally there is perihepatic fluid which appears loculated along the anterior hepatic margin (image 13; series 5). There is patchy enhancement scattered throughout the omentum and colonic mesentery within the central upper abdomen (image 57; series 1202). Musculoskeletal: No aggressive or acute appearing osseous lesions. IMPRESSION: 1. Persistent irregular wall thickening of the gallbladder which is nonspecific in etiology. Findings may be secondary to acute cholecystitis in the appropriate clinical setting. The possibility of gallbladder malignancy is not excluded. 2. There appears to be loculated ascites within the left upper quadrant and within the perihepatic locations, nonspecific in etiology. Additionally, there is patchy enhancement involving the omentum and upper abdominal colonic mesentery. While these findings may be secondary to an acute infectious/inflammatory process, the possibility of omental carcinomatosis and/or malignant ascites is not entirely excluded, potentially if the gallbladder findings are secondary to a malignancy. 3. Given the multitude of findings, many of which have changed or progressed from prior CT, recommend further evaluation with contrast enhanced CT of the abdomen and pelvis. 4. There is narrowing of the distal aspect of the common bile duct on MRCP images which is likely artifactual in etiology. The possibility of a small distal CBD stone is not  entirely excluded. Electronically Signed   By: Annia Belt M.D.   On: 09/02/2019 20:58   US Abdomen Limited Ruq  Result Date: 09/02/2019 CLINICAL DATA:  63 year old male with history of nausea, vomiting and abdominal pain. Under in both  to take deep breaths. EXAM: ULTRASOUND ABDOMEN LIMITED RIGHT UPPER QUADRANT COMPARISON:  None. FINDINGS: Gallbladder: No gallstones. Gallbladder wall appears mildly thickened ranging from 5-7 mm in thickness. No pericholecystic fluid. No sonographic Murphy sign noted by sonographer. Common bile duct: Diameter: 3 mm Liver: No focal lesion identified. Within normal limits in parenchymal echogenicity. Portal vein is patent on color Doppler imaging with normal direction of blood flow towards the liver. Other: None. IMPRESSION: 1. Gallbladder wall appears mildly thickened. This is of uncertain etiology and significance. No gallstones or findings to suggest an acute cholecystitis are noted at this time. Follow-up right upper quadrant abdominal ultrasound is suggested in 2 weeks to ensure resolution of this finding. Should this finding not resolve, further evaluation with abdominal MRI with and without IV gadolinium with MRCP would be suggested to exclude the possibility of gallbladder malignancy. Electronically Signed   By: Trudie Reed M.D.   On: 09/02/2019 13:13    Lab Data:  CBC: Recent Labs  Lab 09/06/19 0351 09/07/19 0341 09/09/19 0354 09/10/19 0249 09/11/19 0307 09/12/19 0317  WBC 10.7* 10.5 15.9* 15.7* 15.3* 13.6*  NEUTROABS 9.4*  --   --   --   --   --   HGB 11.9* 11.3* 12.2* 11.7* 12.1* 12.2*  HCT 38.0* 35.4* 37.7* 35.8* 37.0* 37.6*  MCV 96.9 95.4 93.8 93.2 93.2 93.3  PLT 318 281 316 314 374 366   Basic Metabolic Panel: Recent Labs  Lab 09/07/19 0341 09/09/19 0354 09/10/19 0249 09/11/19 0307 09/12/19 0317  NA 139 137 136 137 137  K 3.8 3.6 3.1* 3.5 3.6  CL 103 99 101 102 105  CO2 GLUCOSE 121* 102* 104* 122* 106*  BUN 7* 6*  CREATININE 0.77 0.86 0.80 0.82 0.78  CALCIUM 7.9* 8.2* 7.9* 8.1* 8.1*   GFR: Estimated Creatinine Clearance: 98.9 mL/min (by C-G formula based on SCr of 0.78 mg/dL). Liver Function Tests: Recent Labs  Lab 09/06/19 0351 09/12/19 0317  AST 36 25  ALT 26 33  ALKPHOS 38 53  BILITOT 1.0 0.7  PROT 5.1* 5.6*  ALBUMIN 2.1* 2.2*   No results for input(s): LIPASE, AMYLASE in the last 168 hours. No results for input(s): AMMONIA in the last 168 hours. Coagulation Profile: Recent Labs  Lab 09/12/19 0834  INR 1.0   Cardiac Enzymes: No results for input(s): CKTOTAL, CKMB, CKMBINDEX, TROPONINI in the last 168 hours. BNP (last 3 results) No results for input(s): PROBNP in the last 8760 hours. HbA1C: No results for input(s): HGBA1C in the last 72 hours. CBG: Recent Labs  Lab 09/07/19 1551 09/07/19 2028 09/08/19 0041 09/08/19 0431 09/08/19 0747  GLUCAP 105* 104* 97 96 94   Lipid Profile: No results for input(s): CHOL, HDL, LDLCALC, TRIG, CHOLHDL, LDLDIRECT in the last 72 hours. Thyroid Function Tests: No results for input(s): TSH, T4TOTAL, FREET4, T3FREE, THYROIDAB in the last 72 hours. Anemia Panel: No results for input(s): VITAMINB12, FOLATE, FERRITIN, TIBC, IRON, RETICCTPCT in the last 72 hours. Urine analysis:    Component Value Date/Time   COLORURINE AMBER (A) 09/02/2019 1631   APPEARANCEUR CLOUDY (A) 09/02/2019 1631   LABSPEC 1.029 09/02/2019 1631   PHURINE 5.0 09/02/2019 1631   GLUCOSEU 50 (A) 09/02/2019 1631   HGBUR NEGATIVE 09/02/2019 1631   BILIRUBINUR SMALL (A) 09/02/2019 1631   KETONESUR 5 (A) 09/02/2019 1631   PROTEINUR 30 (A) 09/02/2019 1631   UROBILINOGEN 0.2 04/01/2015 1155   NITRITE NEGATIVE 09/02/2019  1631   LEUKOCYTESUR NEGATIVE 09/02/2019 1631     Briant Cedar M.D. [facility] 09/12/2019, 3:22 PM

## 2019-09-12 NOTE — Progress Notes (Signed)
Patient ID: Corey Logan, male   DOB: September 28, 1956, 63 y.o.   MRN: [id number]    7 Days Post-Op  Subjective: Patient seems to be feeling better today.  No new complaints.  Discussed his CT scan findings with him and proposed plan  ROS: See above, otherwise other systems negative  Objective: Vital signs in last 24 hours: Temp:  [98.3 F (36.8 C)-98.8 F (37.1 C)] 98.8 F (37.1 C) (12/09 0502) Pulse Rate:  [84-91] 84 (12/09 0502) Resp:  [16-18] 17 (12/09 0502) BP: (123-144)/(73-86) 129/79 (12/09 0502) SpO2:  [98 %] 98 % (12/09 0502) Last BM Date: 09/08/19  Intake/Output from previous day: 12/08 0701 - 12/09 0700 In: 1066.2 [P.O.:1000; IV Piggyback:66.2] Out: 2330 [Urine:2300; Drains:30] Intake/Output this shift: No intake/output data recorded.  PE: Abd: soft, minimally tender, +BS, 2 left JP drains with minimal serous output.  RUQ drain still with thick tan output, but with less output, only 30cc documented.  All incisions are c/d/i  Lab Results:  Recent Labs    09/11/19 0307 09/12/19 0317  WBC 15.3* 13.6*  HGB 12.1* 12.2*  HCT 37.0* 37.6*  PLT 374 366   BMET Recent Labs    09/11/19 0307 09/12/19 0317  NA 137 137  K 3.5 3.6  CL 102 105  CO2 26 23  GLUCOSE 122* 106*  BUN 7* 6*  CREATININE 0.82 0.78  CALCIUM 8.1* 8.1*   PT/INR No results for input(s): LABPROT, INR in the last 72 hours. CMP     Component Value Date/Time   NA 137 09/12/2019 0317   K 3.6 09/12/2019 0317   CL 105 09/12/2019 0317   CO2 23 09/12/2019 0317   GLUCOSE 106 (H) 09/12/2019 0317   BUN 6 (L) 09/12/2019 0317   CREATININE 0.78 09/12/2019 0317   CALCIUM 8.1 (L) 09/12/2019 0317   PROT 5.6 (L) 09/12/2019 0317   ALBUMIN 2.2 (L) 09/12/2019 0317   AST 25 09/12/2019 0317   ALT 33 09/12/2019 0317   ALKPHOS 53 09/12/2019 0317   BILITOT 0.7 09/12/2019 0317   GFRNONAA >60 09/12/2019 0317   GFRAA >60 09/12/2019 0317   Lipase     Component Value Date/Time   LIPASE 55 (H) 09/02/2019 1127         Studies/Results: Ct Abdomen Pelvis W Contrast  Result Date: 09/11/2019 CLINICAL DATA:  Abdominal pain. EXAM: CT ABDOMEN AND PELVIS WITH CONTRAST TECHNIQUE: Multidetector CT imaging of the abdomen and pelvis was performed using the standard protocol following bolus administration of intravenous contrast. CONTRAST:  OMNIPAQUE IOHEXOL 300 MG/ML  SOLN COMPARISON:  September 03, 2019. FINDINGS: Lower chest: Minimal bibasilar subsegmental atelectasis is noted. Hepatobiliary: Status post cholecystectomy. There remains a minimal amount of perihepatic fluid. There is interval placement of surgical drain into the infrahepatic region. Pancreas: Unremarkable. No pancreatic ductal dilatation or surrounding inflammatory changes. Spleen: Spleen appears normal, although there is continued presence of 13.6 x 7.4 cm fluid collection with enhancing margins concerning for possible abscess or seroma. It appears to be enlarged compared to prior exam. Adrenals/Urinary Tract: Adrenal glands are unremarkable. Kidneys are normal, without renal calculi, focal lesion, or hydronephrosis. Bladder is unremarkable. Stomach/Bowel: Small sliding-type hiatal hernia is noted. Stomach is unremarkable. There is no evidence of bowel obstruction or inflammation. Status post appendectomy. Vascular/Lymphatic: Aortic atherosclerosis. No enlarged abdominal or pelvic lymph nodes. Reproductive: Prostate is unremarkable. Other: Also noted is surgical drain entering the left side the abdomen extending into the pelvis. Another surgical drain is seen entering the  left upper quadrant which extends through fluid collection in epigastric region adjacent to left hepatic lobe and above the stomach. This measures 10.9 x 7.8 cm and is slightly enlarged compared to prior exam. It also demonstrates enhancing margins suggesting possible developing abscess or seroma. There are multiple other smaller fluid collection seen in the epigastric region anteriorly  with enhancing walls concerning for possible small abscesses. Musculoskeletal: No acute or significant osseous findings. IMPRESSION: Status post cholecystectomy. Interval placement of surgical drain and infrahepatic region with decreased amount of perihepatic fluid compared to prior exam. There is also been interval placement of a no other surgical drain in the left upper quadrant with distal tip passing through fluid collection in epigastric region between left hepatic lobe and stomach. This fluid collection is slightly enlarged with enhancing margin concerning for possible abscess or seroma. Also noted is 13.6 x 7.4 cm fluid collection in the left subdiaphragmatic placed adjacent to the spleen with enhancing margins which is in large compared to prior exam. This also is concerning for possible abscess or seroma. There is also the development of innumerable small fluid collections in the anterior portion of the epigastric region with enhancing walls concerning for multiple small abscesses or seromas. Aortic Atherosclerosis (ICD10-I70.0). Electronically Signed   By: Marijo Conception M.D.   On: 09/11/2019 17:10    Anti-infectives: Anti-infectives (From admission, onward)   Start     Dose/Rate Route Frequency Ordered Stop   09/11/19 1800  piperacillin-tazobactam (ZOSYN) IVPB 3.375 g     3.375 g 12.5 mL/hr over 240 Minutes Intravenous Every 8 hours 09/11/19 1648 09/18/19 1759   09/04/19 0000  piperacillin-tazobactam (ZOSYN) IVPB 3.375 g     3.375 g 12.5 mL/hr over 240 Minutes Intravenous Every 8 hours 09/03/19 1828 09/10/19 2100   09/03/19 1830  piperacillin-tazobactam (ZOSYN) IVPB 3.375 g     3.375 g 100 mL/hr over 30 Minutes Intravenous STAT 09/03/19 1828 09/03/19 1916       Assessment/Plan H/o heavy ETOH abuse with recent DTs - states he has abstained since recent admission HTN   POD7, s/p dx lap with drainage of bilious ascites, cholecystectomy, biopsy of appendiceal epiploicum, Dr. Lucia Gaskins  12/2 -unclear etiology still of diffuse ascites, but did not appear to be secondary to the gallbladder- this was removed to take this off table as etiology -significant saponification was noted but this was expected secondary to bilious ascites. -small vascular nodule noted on an appendiceal epiploicum that was biopsied -ALL CX, PATH, ETC, ARE NEGATIVE FOR GROWTH AND NO EVIDENCE OF MALIGNANCY.  -WBC down to 13K, but restarted on IV zosyn yesterday -CT scan shows a large fluid collection over the spleen as well as one over the stomach, but appears the surgical RUQ drain is traversing this collection.  Will have IR eval today for perc drain of the perisplenic fluid collection. -I have attempted to call the patient's cell phone multiple times, but it is turned off and her voicemail is full.  I have tried to call her work this morning but they are not open yet.  FEN -NPO for IR procedure VTE -[facility] on hold this am for perc drain placement, SCDs ID -[email or web address] 12/1-->12/7, 12/8 -->   LOS: 9 days    Henreitta Cea , [facility] 09/12/2019, 8:09 AM Please see Amion for pager number during day hours 7:00am-4:30pm

## 2019-09-12 NOTE — Consult Note (Signed)
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 basilar airspace opacities are noted. The visualized soft tissues and bony thorax are unremarkable. IMPRESSION: 1. Stable appearance of bilateral pleural effusions and basilar airspace disease. While this likely reflects atelectasis, infection is not excluded. 2. Moderate pulmonary vascular congestion. Electronically Signed   By: Marin Robertshristopher  Mattern M.D.   On: 09/03/2019 05:01   Mr Abdomen Mrcp Vivien RossettiW Wo Contast  Result Date: 09/02/2019 CLINICAL DATA:  Patient with possible pancreatitis. Gallbladder wall thickening on prior right upper quadrant ultrasound. EXAM: MRI ABDOMEN WITHOUT AND WITH CONTRAST (INCLUDING MRCP) TECHNIQUE: Multiplanar multisequence MR imaging of the abdomen was performed both before and after the administration of intravenous contrast. Heavily T2-weighted images of the biliary and pancreatic ducts were obtained, and three-dimensional MRCP images were rendered by post processing. CONTRAST:  9mL GADAVIST GADOBUTROL 1 MMOL/ML IV SOLN COMPARISON:  Ultrasound abdomen 09/01/2009; CT abdomen pelvis 08/04/2019 FINDINGS: Lower chest: Small bilateral pleural effusions. Consolidative opacities within the lower lungs  bilaterally. Hepatobiliary: Liver is normal in size and contour. There is irregular wall thickening of the gallbladder. No cholelithiasis. Possible filling defect within the distal common bile duct on MRCP images (image 30; series 7). No intrahepatic or extrahepatic biliary ductal dilatation. Pancreas:  Unremarkable Spleen:  Unremarkable Adrenals/Urinary Tract: Normal adrenal glands. Kidneys enhance symmetrically with contrast. No hydronephrosis. Stomach/Bowel: Moderate sized hiatal hernia. Otherwise normal morphology of the stomach. No evidence for small bowel obstruction. Vascular/Lymphatic: Normal caliber abdominal aorta. No retroperitoneal lymphadenopathy. Other: There is ascites within the left upper quadrant. Additionally there is perihepatic fluid which appears loculated along the anterior hepatic margin (image 13; series 5). There is patchy enhancement scattered throughout the omentum and colonic mesentery within the central upper abdomen (image 57; series 1202). Musculoskeletal: No aggressive or acute appearing osseous lesions. IMPRESSION: 1. Persistent irregular wall thickening of the gallbladder which is nonspecific in etiology. Findings may be secondary to acute cholecystitis in the appropriate clinical setting. The possibility of gallbladder malignancy is not excluded. 2. There appears to be loculated ascites within the left upper quadrant and within the perihepatic locations, nonspecific in etiology. Additionally, there is patchy enhancement involving the omentum and upper abdominal colonic mesentery. While these findings may be secondary to an acute infectious/inflammatory process, the possibility of omental carcinomatosis and/or malignant ascites is not entirely excluded, potentially if the gallbladder findings are secondary to a malignancy. 3. Given the multitude of findings, many of which have changed or progressed from prior CT, recommend further evaluation with contrast enhanced CT of the abdomen  and pelvis. 4. There is narrowing of the distal aspect of the common bile duct on MRCP images which is likely artifactual in etiology. The possibility of a small distal CBD stone is not entirely excluded. Electronically Signed   By: Annia Beltrew  Davis M.D.   On: 09/02/2019 20:58   [place] Abdomen Limited Ruq  Result Date: 09/02/2019 CLINICAL DATA:  63 year old male with history of nausea, vomiting and abdominal pain. Under in both  to take deep breaths. EXAM: ULTRASOUND ABDOMEN LIMITED RIGHT UPPER QUADRANT COMPARISON:  None. FINDINGS: Gallbladder: No gallstones. Gallbladder wall appears mildly thickened ranging from 5-7 mm in thickness. No pericholecystic fluid. No sonographic Murphy sign noted by sonographer. Common bile duct: Diameter: 3 mm Liver: No focal lesion identified. Within normal limits in parenchymal echogenicity. Portal vein is patent on color Doppler imaging with normal direction of blood flow towards the liver. Other: None. IMPRESSION: 1. Gallbladder wall appears mildly thickened. This is of uncertain etiology and significance. No gallstones or findings to suggest an acute cholecystitis are noted at this time. Follow-up right upper quadrant abdominal ultrasound is suggested in 2 weeks to ensure resolution of this finding. Should this finding not resolve, further evaluation with abdominal MRI with and without IV gadolinium with MRCP would be suggested to exclude the possibility of gallbladder malignancy. Electronically Signed   By: Trudie Reed M.D.   On: 09/02/2019 13:13    Labs:  CBC: Recent Labs    09/09/19 0354 09/10/19 0249 09/11/19 0307 09/12/19 0317  WBC 15.9* 15.7* 15.3* 13.6*  HGB 12.2* 11.7* 12.1* 12.2*  HCT 37.7* 35.8* 37.0* 37.6*  PLT 316 314 374 366    COAGS: Recent Labs    09/12/19 0834  INR 1.0    BMP: Recent Labs    09/09/19 0354 09/10/19 0249 09/11/19 0307 09/12/19 0317  NA 137 136 137 137  K 3.6 3.1* 3.5 3.6  CL 99 101 102 105  CO2 GLUCOSE  102* 104* 122* 106*  BUN 12 9 7* 6*  CALCIUM 8.2* 7.9* 8.1* 8.1*  CREATININE 0.86 0.80 0.82 0.78  GFRNONAA >60 >60 >60 >60  GFRAA >60 >60 >60 >60    LIVER FUNCTION TESTS: Recent Labs    09/04/19 0127 09/05/19 0326 09/06/19 0351 09/12/19 0317  BILITOT 2.0* 1.4* 1.0 0.7  AST 18 12* 36 25  ALT 33  ALKPHOS 38 33* 38 53  PROT 5.6* 5.4* 5.1* 5.6*  ALBUMIN 2.7* 2.5* 2.1* 2.2*    TUMOR MARKERS: No results for input(s): AFPTM, CEA, CA199, CHROMGRNA in the last 8760 hours.  Assessment and Plan: 63 y.o. male with past medical history of arthritis, depression, GERD, hiatal hernia, hypertension, and alcohol abuse /recent alcohol withdrawal syndrome who was admitted to [facility] on 11/29 with nausea, vomiting and right upper quadrant pain.  Imaging at the time revealed extensive perihepatic fluid with scalloping of hepatic margin, diffuse ascites, irregular omental and peritoneal stranding, marked irregular gallbladder wall thickening. He subsequently underwent laparoscopy with evacuation of bilious ascites, cholecystectomy and biopsy of left colon appendices epiploicae on 12/2 with findings of chronic cholecystitis, saponification of omental fat ,benign colonic fibroadipose tissue and negative perit fluid cytology.  Peritoneal fluid cultures also negative.  Patient had 3 surgical drains placed at the time of surgery but currently only has right lower quadrant drain.  2 left-sided abd drains were  removed today.  Follow-up CT abdomen pelvis done yesterday revealed:  Status post cholecystectomy. Interval placement of surgical drain and infrahepatic region with decreased amount of perihepatic fluid compared to prior exam. There is also been interval placement of a no other surgical drain in the left upper quadrant with distal tip passing through fluid collection in epigastric region between left hepatic lobe and stomach. This fluid collection is slightly enlarged with enhancing  margin concerning for possible abscess or seroma.  Also noted is 13.6 x 7.4 cm fluid collection in the  left subdiaphragmatic placed adjacent to the spleen with enhancing margins which is in large compared to prior exam. This also is concerning for possible abscess or seroma.  There is also the development of innumerable small fluid collections in the anterior portion of the epigastric region with enhancing walls concerning for multiple small abscesses or seromas.  Aortic Atherosclerosis (ICD10-I70.0).  Patient is COVID-19 negative.  He is afebrile.  Current labs include WBC 13.6, hemoglobin 12.2, platelets 366k, normal creatinine, normal PT/INR.  Request now received from surgery for CT-guided aspiration/drainage of perisplenic fluid collection. Imaging studies were reviewed by Dr. Anselm Pancoast. Risks and benefits discussed with the patient including bleeding, infection, damage to adjacent structures, bowel perforation/fistula connection, and sepsis.  All of the patient's questions were answered, patient is agreeable to proceed. Consent signed and in chart.  Procedure scheduled for today(12/9)   Thank you for this interesting consult.  I greatly enjoyed meeting Corey Logan and look forward to participating in their care.  A copy of this report was sent to the requesting provider on this date.  Electronically Signed: D. Rowe Robert, PA-C 09/12/2019, 9:59 AM   I spent a total of  25 minutes   in face to face in clinical consultation, greater than 50% of which was counseling/coordinating care for CT guided aspiration/drainage of perisplenic fluid collection

## 2019-09-12 NOTE — Procedures (Signed)
Interventional Radiology Procedure:   Indications: Left abdominal fluid colleection  Procedure: CT guided drain placement  Findings: Placed 10 Fr drain in perisplenic collection and removed approximately 280 ml of yellow fluid.  Fluid has a small amount of sediment.  Complications: None     EBL: less than 10 ml  Plan: Follow output and send fluid for culture.    Corey Logan R. Anselm Pancoast, MD  Pager: [phone]

## 2019-09-13 LAB — BASIC METABOLIC PANEL
Anion gap: 9 (ref 5–15)
BUN: 11 mg/dL (ref 8–23)
CO2: 24 mmol/L (ref 22–32)
Calcium: 8.1 mg/dL — ABNORMAL LOW (ref 8.9–10.3)
Chloride: 104 mmol/L (ref 98–111)
Creatinine, Ser: 0.87 mg/dL (ref 0.61–1.24)
GFR calc Af Amer: >60 mL/min (ref >60)
GFR calc non Af Amer: >60 mL/min (ref >60)
Glucose, Bld: 107 mg/dL — ABNORMAL HIGH (ref 70–99)
Potassium: 3.6 mmol/L (ref 3.5–5.1)
Sodium: 137 mmol/L (ref 135–145)

## 2019-09-13 LAB — CBC WITH DIFFERENTIAL/PLATELET
Abs Immature Granulocytes: 0.17 10*3/uL — ABNORMAL HIGH (ref 0.00–0.07)
Basophils Absolute: 0 10*3/uL (ref 0.0–0.1)
Basophils Relative: 0 %
Eosinophils Absolute: 0.2 10*3/uL (ref 0.0–0.5)
Eosinophils Relative: 2 %
HCT: 36.3 % — ABNORMAL LOW (ref 39.0–52.0)
Hemoglobin: 11.5 g/dL — ABNORMAL LOW (ref 13.0–17.0)
Immature Granulocytes: 2 %
Lymphocytes Relative: 16 %
Lymphs Abs: 1.8 10*3/uL (ref 0.7–4.0)
MCH: 30.1 pg (ref 26.0–34.0)
MCHC: 31.7 g/dL (ref 30.0–36.0)
MCV: 95 fL (ref 80.0–100.0)
Monocytes Absolute: 0.9 10*3/uL (ref 0.1–1.0)
Monocytes Relative: 8 %
Neutro Abs: 8.4 10*3/uL — ABNORMAL HIGH (ref 1.7–7.7)
Neutrophils Relative %: 72 %
Platelets: 396 10*3/uL (ref 150–400)
RBC: 3.82 MIL/uL — ABNORMAL LOW (ref 4.22–5.81)
RDW: 12.2 % (ref 11.5–15.5)
WBC: 11.5 10*3/uL — ABNORMAL HIGH (ref 4.0–10.5)
nRBC: 0 % (ref 0.0–0.2)

## 2019-09-13 MED ORDER — AMOXICILLIN-POT CLAVULANATE 875-125 MG PO TABS: 1.0000 | ORAL_TABLET | Freq: Two times a day (BID) | ORAL | 0 refills | Status: AC

## 2019-09-13 MED ORDER — ACETAMINOPHEN 325 MG PO TABS: 650.0000 mg | ORAL_TABLET | Freq: Four times a day (QID) | ORAL | Status: AC | PRN

## 2019-09-13 MED ORDER — AMOXICILLIN-POT CLAVULANATE 875-125 MG PO TABS
1.0000 | ORAL_TABLET | Freq: Two times a day (BID) | ORAL | Status: DC
  Administered 2019-09-13: 1 via ORAL
  Filled 2019-09-13: qty 1

## 2019-09-13 NOTE — Progress Notes (Signed)
Pt educated on how to flush his RUQ drain. Pt verbalized understanding. All questions answered.

## 2019-09-13 NOTE — Discharge Summary (Addendum)
Discharge Summary  Corey Logan OIT:[id number] DOB: Mar 09, 1956  PCP: [name], MD  Admit date: 09/02/2019 Discharge date: 09/13/2019  Time spent: 40 minutes  Recommendations for Outpatient Follow-up:  1. Follow-up with PCP in 1 week 2. Follow-up with IR in 2 weeks 3. Follow-up with general surgery in 2 weeks  Discharge Diagnoses:  Active Hospital Problems   Diagnosis Date Noted   RUQ pain 09/03/2019   Hyperglycemia 09/03/2019   Abnormal MRI of abdomen 09/03/2019   Cholecystitis 09/03/2019   Right upper quadrant abdominal pain 08/04/2019   HTN (hypertension) 08/04/2019    Resolved Hospital Problems  No resolved problems to display.    Discharge Condition: Stable  Diet recommendation: Regular  Vitals:   09/12/19 2027 09/13/19 0538  BP: 135/80 133/82  Pulse: 86 79  Resp: 20 18  Temp: 98.7 F (37.1 C) 98.2 F (36.8 C)  SpO2: 96% 96%    History of present illness:  63 y.o.malewith medical history significant of arthritis, depression, GERD, hiatal hernia, hypertensionalcohol dependence presented to ED on 11/29 with nausea, vomiting, right upper quadrant pain. MRCP reports distal CBD stone vs artifact, GB wall Irregularity,loculated ascites within the left upper quadrant and within the perihepatic locations, nonspecific in etiology. Additionally, patchy enhancement involving the omentum and upper abdominal colonic mesentery-concerning for malignancy-Repeat CT A/P per GS along with CA 19-9, AFP, CA 125 sent, all negative. RepeatHIDA wnlon 12/1.Given persistent symptoms, taken to OR on 12/2.Intraoperative findings consistent with biliary ascites possibly gallbladder source-given gallbladder thickening and possibility of chronic cholecystitis, cholecystectomy was performed as well. After abdominal irrigation, 3 drains placed and subsequently removed by GS. Due to persistent WBC, repeat CT abdomen showed slightly enlarged fluid collection concerning for  possible abscess or seroma, 13.6 x 7.4 cm fluid collection in the left subdiaphragmatic area adjacent to the spleen, with innumerable small fluid collections concerning for multiple small abscesses or seromas.  IR placed another left abdominal drain on 09/12/2019    Today, patient denies any new complaints, met patient sitting up in bed having breakfast.  Denies any worsening abdominal pain, chest pain, nausea/vomiting/diarrhea, fever/chills, cough.  Hospital Course:  Active Problems:   Right upper quadrant abdominal pain   HTN (hypertension)   RUQ pain   Hyperglycemia   Abnormal MRI of abdomen   Cholecystitis  Chronic cholecystitis, bilious ascites with saponification of the omental fat s/p cholecystectomy, complicated by abdominal abscess Currently afebrile, with resolving leukocytosis General surgery was consulted, underwent laparoscopic evaluation of bilious ascites, intraoperative cholangiogram (unremarkable), cholecystectomy.  Unclear etiology of diffuse biliary ascites, significant saponification was noted. Cytology showed reactive mesothelial cells.  Pathology consistent with chronic cholecystitis, left colon epiploicae appendices pathology consistent with benign fibroadipose tissue.  No malignancy EGD was unremarkable. Tumor markers unremarkable CA 19-9 8.0, CA-125 61, AFP 1.1. Repeat CT abdomen done on 09/11/19 showed slightly enlarged fluid collection concerning for possible abscess or seroma, 13.6 x 7.4 cm fluid collection in the left subdiaphragmatic area adjacent to the spleen, with innumerable small fluid collections concerning for multiple small abscesses or seromas IR placed another left sided drain on 09/12/2019 Drain care discussed extensively with patient, in addition to nursing education.  Patient verbalized understanding Status post IV Zosyn, general surgery switching to p.o. Augmentin for another 5 days Follow-up with IR in 2 weeks for drain removal and  reassessment Follow-up with general surgery in 2 weeks  History of alcohol dependence with recent episode of alcohol withdrawal Patient reports abstinence for at least a month  now, not in any withdrawals Continue multivitamin, thiamine  GERD/hiatal hernia with esophageal dysmotility Underwent EGD last admission, continue PPI            Malnutrition Type:      Malnutrition Characteristics:      Nutrition Interventions:      Estimated body mass index is 25.91 kg/m as calculated from the following:   Height as of this encounter: 5' 10" (1.778 m).   Weight as of this encounter: 81.9 kg.    Procedures: 09/05/2019 DiagnosticLAPAROSCOPY, evacuation of bilious ascites(90 minutes for diagnostic lap and evacuation of bilious ascites),UPPEREndoscopy,LAPAROSCOPIC CHOLECYSTECTOMY WITH INTRAOPERATIVE CHOLANGIOGRAM, biopsy of left colon appendices epiploicae Drain placement by IR  Consultations:  General surgery  IR  GI  Discharge Exam: BP 133/82 (BP Location: Right Arm)    Pulse 79    Temp 98.2 F (36.8 C) (Oral)    Resp 18    Ht 5' 10" (1.778 m)    Wt 81.9 kg    SpO2 96%    BMI 25.91 kg/m   General: NAD Cardiovascular: S1, S2 present Respiratory: CTA B Abdomen: Soft, nontender, nondistended, bowel sounds present.  LUQ drain with greenish tinge output, incisions C/D/I  Discharge Instructions You were cared for by a hospitalist during your hospital stay. If you have any questions about your discharge medications or the care you received while you were in the hospital after you are discharged, you can call the unit and asked to speak with the hospitalist on call if the hospitalist that took care of you is not available. Once you are discharged, your primary care physician will handle any further medical issues. Please note that NO REFILLS for any discharge medications will be authorized once you are discharged, as it is imperative that you return to your primary care  physician (or establish a relationship with a primary care physician if you do not have one) for your aftercare needs so that they can reassess your need for medications and monitor your lab values.  Discharge Instructions    Diet - low sodium heart healthy   Complete by: As directed    Increase activity slowly   Complete by: As directed      Allergies as of 09/13/2019   No Known Allergies     Medication List    TAKE these medications   acetaminophen 325 MG tablet Commonly known as: TYLENOL Take 2 tablets (650 mg total) by mouth every 6 (six) hours as needed for mild pain (or Fever >/= 101).   amoxicillin-clavulanate 875-125 MG tablet Commonly known as: AUGMENTIN Take 1 tablet by mouth every 12 (twelve) hours for 5 days.   folic acid 1 MG tablet Commonly known as: FOLVITE Take 1 tablet (1 mg total) by mouth daily.   Melatonin 5 MG Tabs Take 5 mg by mouth at bedtime.   multivitamin with minerals Tabs tablet Take 1 tablet by mouth daily.   pantoprazole 40 MG tablet Commonly known as: PROTONIX Take 1 tablet (40 mg total) by mouth daily.   thiamine 100 MG tablet Take 1 tablet (100 mg total) by mouth daily.      No Known Allergies Follow-up Information    Alphonsa Overall, MD Follow up in 2 week(s).   Specialty: General Surgery Why: our office will call you with an appointment date and time. Contact information: [address] [place] [address] [phone]        Markus Daft, MD Follow up in 2 week(s).  Specialties: Interventional Radiology, Radiology Why: Please follow-up with Dr. Anselm Pancoast in clinic 10-14 days after discharge. Our office will call you to set up this appointment. Contact information: [address] [place] [address] [phone]        [name], MD. Schedule an appointment as soon as possible for a visit in 1 week(s).   Specialty: Family Medicine Contact information: [address]  [place] [address] [phone]            The results of significant diagnostics from this hospitalization (including imaging, microbiology, ancillary and laboratory) are listed below for reference.    Significant Diagnostic Studies: DG Cholangiogram Operative  Result Date: 09/06/2019 CLINICAL DATA:  Gallbladder wall thickening EXAM: INTRAOPERATIVE CHOLANGIOGRAM TECHNIQUE: Cholangiographic images from the C-arm fluoroscopic device were submitted for interpretation post-operatively. Please see the procedural report for the amount of contrast and the fluoroscopy time utilized. COMPARISON:  CT 09/03/2019 FINDINGS: No persistent filling defects in the common duct. Intrahepatic ducts are incompletely visualized, appearing decompressed centrally. Contrast passes into the duodenum. : Negative for retained common duct stone. Electronically Signed   By: Lucrezia Europe M.D.   On: 09/06/2019 05:45   CT ANGIO CHEST PE W OR WO CONTRAST  Result Date: 09/03/2019 CLINICAL DATA:  Abdominal pain, MRI suggesting omental caking. EXAM: CT CHEST CTA protocol of the chest and CT of the ABDOMEN, AND PELVIS WITH CONTRAST TECHNIQUE: Multidetector CT imaging of the chest utilizing CTA, PE protocol, and abdomen and pelvis was performed following the standard protocol during bolus administration of intravenous contrast. Maximum intensity projection reconstructions were generated, submitted and were reviewed. CONTRAST:  55m OMNIPAQUE IOHEXOL 350 MG/ML SOLN COMPARISON:  MRI 09/02/2019 prior CT assessments dating back to October of 2020 FINDINGS: CT CHEST FINDINGS Cardiovascular: Scattered aortic atherosclerosis. Mild dilation of ascending thoracic aorta 3.9 cm. Heart size is mildly enlarged without pericardial fluid. No signs of pulmonary embolism. Mediastinum/Nodes: No signs of mediastinal lymphadenopathy. Small amount of loculated fluid adjacent to a small hiatal hernia. Fluid is contiguous with hepatic gastric in left subdiaphragmatic  fluid. No signs of adenopathy in the chest. Lungs/Pleura: Basilar collapse/consolidation bilaterally. Small bilateral pleural effusions. Airways are patent. Musculoskeletal: No signs of chest wall mass. No signs of acute bone finding or evidence of destructive bone process. No signs of chest wall mass. CT ABDOMEN PELVIS FINDINGS Hepatobiliary: Interval development of extensive perihepatic fluid with scalloping of the a Paddock margin. Fluid extends into the hepatic gastric recess, left subdiaphragmatic space along the right hemi liver and in the right pericolic gutter. Also tracking into the pelvis density between 15 and 20 Hounsfield units, nonspecific. Diffuse irregular gallbladder wall thickening. Gallbladder is less distended than on the previous exam. No focal mass lesion in the gallbladder. Pancreas: No signs of acute interstitial pancreatitis, ductal dilation or lesion. Spleen: Normal in size without focal abnormality. Adrenals/Urinary Tract: Normal adrenal glands. Symmetric enhancement of the bilateral kidneys. Stomach/Bowel: Small hiatal hernia. Signs of interloop fluid along the jejunum. Developing bowel distension since the MRI of 09/02/2019. Gradual transition in the mid abdomen but with decompressed distal small bowel loops. No signs of pneumatosis. Diffuse mesenteric edema. Vascular/Lymphatic: Calcified and noncalcified atherosclerotic plaque in the abdominal aorta. No signs of aneurysm or acute abdominal vascular abnormality. The portal vein is patent. The splenic vein is patent. No signs of adenopathy in the upper abdomen or retroperitoneum. No signs of pelvic lymphadenopathy. Reproductive: Nonspecific appearance of the prostate by CT. Other: Perihepatic and  upper abdominal fluid. Fluid along the left hepatic margin with scalloping of the left hemi liver measures 8 x 8 cm. Fluid in the left subdiaphragmatic area adjacent the spleen measures 14 by 4 cm. Right flank fluid 6.8 by 2.8 cm. Smaller pockets  of fluid along the anterior surface of the liver. Diffuse omental stranding or nodularity. Interloop fluid adjacent to jejunal loops in the left abdomen 5.1 x 2.2 cm. No signs of free air. Musculoskeletal: No signs of acute bone finding or evidence of destructive bone process. IMPRESSION: 1. Interval development of extensive perihepatic fluid with scalloping of the hepatic margin, associated with diffuse ascites, some areas with more loculated appearance and with interloop fluid in the left hemiabdomen. Also associated with irregular omental and peritoneal stranding along with bowel edema. Constellation of findings could be seen in the setting of peritonitis, rapid development favors this alternative. Secondarily malignant ascites could also have this appearance. Diagnostic paracentesis could be helpful. 2. Marked irregular gallbladder wall thickening, gallbladder is less distended than on the previous exam. Gallbladder compromise cannot be proven on the basis of this study though biliary peritonitis is certainly in the differential based on the appearance of the abdominal fluid. Acute on chronic cholecystitis with gallbladder compromise is considered. Based on gallbladder wall thickening neoplasm remains in the differential. 3. No signs of extraluminal contrast or other findings that would support bowel leak or perforated ulcer as a cause for diffuse peritonitis. 4. No signs of pulmonary embolism. 5. Dense material in collapsed lung at the lung bases likely iodinated contrast, potentially aspirated by this patient with basilar airspace disease and small effusions. Aortic Atherosclerosis (ICD10-I70.0). Electronically Signed   By: Zetta Bills M.D.   On: 09/03/2019 18:05   NM Hepatobiliary Liver Func  Result Date: 09/04/2019 CLINICAL DATA:  Right upper quadrant pain EXAM: NUCLEAR MEDICINE HEPATOBILIARY IMAGING TECHNIQUE: Sequential images of the abdomen were obtained out to 60 minutes following intravenous  administration of radiopharmaceutical. RADIOPHARMACEUTICALS:  7.8 mCi Tc-14m Choletec IV COMPARISON:  CT abdomen and pelvis September 03, 2019 FINDINGS: Prompt uptake and biliary excretion of activity by the liver is seen. Gallbladder activity is visualized, consistent with patency of cystic duct. Biliary activity passes into small bowel, consistent with patent common bile duct. IMPRESSION: Study within normal limits. Electronically Signed   By: WLowella GripIII M.D.   On: 09/04/2019 14:05   NM Pulmonary Perfusion  Result Date: 09/03/2019 CLINICAL DATA:  Inpatient.  Elevated D-dimer.  Abdominal pain. EXAM: NUCLEAR MEDICINE PERFUSION LUNG SCAN TECHNIQUE: Perfusion images were obtained in multiple projections after intravenous injection of radiopharmaceutical. Ventilation scans were attempted but not tolerated by the patient (patient vomited after breathing apparatus placed). RADIOPHARMACEUTICALS:  1.5 mCi Tc-959mAA IV COMPARISON:  Chest radiograph from earlier today. FINDINGS: There are multiple small moderate perfusion defects at posterior lung bases bilaterally, correlating with areas of airspace disease on the chest radiograph from earlier today. IMPRESSION: Perfusion-only scan, see comments. Multiple small to moderate perfusion defects at the posterior lung bases bilaterally with chest radiograph correlates. Findings are considered intermediate probability for acute pulmonary embolism. Electronically Signed   By: JaIlona Sorrel.D.   On: 09/03/2019 16:52   CT ABDOMEN PELVIS W CONTRAST  Result Date: 09/11/2019 CLINICAL DATA:  Abdominal pain. EXAM: CT ABDOMEN AND PELVIS WITH CONTRAST TECHNIQUE: Multidetector CT imaging of the abdomen and pelvis was performed using the standard protocol following bolus administration of intravenous contrast. CONTRAST:  10046mMNIPAQUE IOHEXOL 300 MG/ML  SOLN COMPARISON:  September 03, 2019. FINDINGS: Lower chest: Minimal bibasilar subsegmental atelectasis is noted.  Hepatobiliary: Status post cholecystectomy. There remains a minimal amount of perihepatic fluid. There is interval placement of surgical drain into the infrahepatic region. Pancreas: Unremarkable. No pancreatic ductal dilatation or surrounding inflammatory changes. Spleen: Spleen appears normal, although there is continued presence of 13.6 x 7.4 cm fluid collection with enhancing margins concerning for possible abscess or seroma. It appears to be enlarged compared to prior exam. Adrenals/Urinary Tract: Adrenal glands are unremarkable. Kidneys are normal, without renal calculi, focal lesion, or hydronephrosis. Bladder is unremarkable. Stomach/Bowel: Small sliding-type hiatal hernia is noted. Stomach is unremarkable. There is no evidence of bowel obstruction or inflammation. Status post appendectomy. Vascular/Lymphatic: Aortic atherosclerosis. No enlarged abdominal or pelvic lymph nodes. Reproductive: Prostate is unremarkable. Other: Also noted is surgical drain entering the left side the abdomen extending into the pelvis. Another surgical drain is seen entering the left upper quadrant which extends through fluid collection in epigastric region adjacent to left hepatic lobe and above the stomach. This measures 10.9 x 7.8 cm and is slightly enlarged compared to prior exam. It also demonstrates enhancing margins suggesting possible developing abscess or seroma. There are multiple other smaller fluid collection seen in the epigastric region anteriorly with enhancing walls concerning for possible small abscesses. Musculoskeletal: No acute or significant osseous findings. IMPRESSION: Status post cholecystectomy. Interval placement of surgical drain and infrahepatic region with decreased amount of perihepatic fluid compared to prior exam. There is also been interval placement of a no other surgical drain in the left upper quadrant with distal tip passing through fluid collection in epigastric region between left hepatic  lobe and stomach. This fluid collection is slightly enlarged with enhancing margin concerning for possible abscess or seroma. Also noted is 13.6 x 7.4 cm fluid collection in the left subdiaphragmatic placed adjacent to the spleen with enhancing margins which is in large compared to prior exam. This also is concerning for possible abscess or seroma. There is also the development of innumerable small fluid collections in the anterior portion of the epigastric region with enhancing walls concerning for multiple small abscesses or seromas. Aortic Atherosclerosis (ICD10-I70.0). Electronically Signed   By: Marijo Conception M.D.   On: 09/11/2019 17:10   CT ABDOMEN PELVIS W CONTRAST  Result Date: 09/03/2019 CLINICAL DATA:  Abdominal pain, MRI suggesting omental caking. EXAM: CT CHEST CTA protocol of the chest and CT of the ABDOMEN, AND PELVIS WITH CONTRAST TECHNIQUE: Multidetector CT imaging of the chest utilizing CTA, PE protocol, and abdomen and pelvis was performed following the standard protocol during bolus administration of intravenous contrast. Maximum intensity projection reconstructions were generated, submitted and were reviewed. CONTRAST:  41m OMNIPAQUE IOHEXOL 350 MG/ML SOLN COMPARISON:  MRI 09/02/2019 prior CT assessments dating back to October of 2020 FINDINGS: CT CHEST FINDINGS Cardiovascular: Scattered aortic atherosclerosis. Mild dilation of ascending thoracic aorta 3.9 cm. Heart size is mildly enlarged without pericardial fluid. No signs of pulmonary embolism. Mediastinum/Nodes: No signs of mediastinal lymphadenopathy. Small amount of loculated fluid adjacent to a small hiatal hernia. Fluid is contiguous with hepatic gastric in left subdiaphragmatic fluid. No signs of adenopathy in the chest. Lungs/Pleura: Basilar collapse/consolidation bilaterally. Small bilateral pleural effusions. Airways are patent. Musculoskeletal: No signs of chest wall mass. No signs of acute bone finding or evidence of  destructive bone process. No signs of chest wall mass. CT ABDOMEN PELVIS FINDINGS Hepatobiliary: Interval development of extensive perihepatic fluid with scalloping of the a Paddock margin. Fluid  extends into the hepatic gastric recess, left subdiaphragmatic space along the right hemi liver and in the right pericolic gutter. Also tracking into the pelvis density between 15 and 20 Hounsfield units, nonspecific. Diffuse irregular gallbladder wall thickening. Gallbladder is less distended than on the previous exam. No focal mass lesion in the gallbladder. Pancreas: No signs of acute interstitial pancreatitis, ductal dilation or lesion. Spleen: Normal in size without focal abnormality. Adrenals/Urinary Tract: Normal adrenal glands. Symmetric enhancement of the bilateral kidneys. Stomach/Bowel: Small hiatal hernia. Signs of interloop fluid along the jejunum. Developing bowel distension since the MRI of 09/02/2019. Gradual transition in the mid abdomen but with decompressed distal small bowel loops. No signs of pneumatosis. Diffuse mesenteric edema. Vascular/Lymphatic: Calcified and noncalcified atherosclerotic plaque in the abdominal aorta. No signs of aneurysm or acute abdominal vascular abnormality. The portal vein is patent. The splenic vein is patent. No signs of adenopathy in the upper abdomen or retroperitoneum. No signs of pelvic lymphadenopathy. Reproductive: Nonspecific appearance of the prostate by CT. Other: Perihepatic and upper abdominal fluid. Fluid along the left hepatic margin with scalloping of the left hemi liver measures 8 x 8 cm. Fluid in the left subdiaphragmatic area adjacent the spleen measures 14 by 4 cm. Right flank fluid 6.8 by 2.8 cm. Smaller pockets of fluid along the anterior surface of the liver. Diffuse omental stranding or nodularity. Interloop fluid adjacent to jejunal loops in the left abdomen 5.1 x 2.2 cm. No signs of free air. Musculoskeletal: No signs of acute bone finding or evidence  of destructive bone process. IMPRESSION: 1. Interval development of extensive perihepatic fluid with scalloping of the hepatic margin, associated with diffuse ascites, some areas with more loculated appearance and with interloop fluid in the left hemiabdomen. Also associated with irregular omental and peritoneal stranding along with bowel edema. Constellation of findings could be seen in the setting of peritonitis, rapid development favors this alternative. Secondarily malignant ascites could also have this appearance. Diagnostic paracentesis could be helpful. 2. Marked irregular gallbladder wall thickening, gallbladder is less distended than on the previous exam. Gallbladder compromise cannot be proven on the basis of this study though biliary peritonitis is certainly in the differential based on the appearance of the abdominal fluid. Acute on chronic cholecystitis with gallbladder compromise is considered. Based on gallbladder wall thickening neoplasm remains in the differential. 3. No signs of extraluminal contrast or other findings that would support bowel leak or perforated ulcer as a cause for diffuse peritonitis. 4. No signs of pulmonary embolism. 5. Dense material in collapsed lung at the lung bases likely iodinated contrast, potentially aspirated by this patient with basilar airspace disease and small effusions. Aortic Atherosclerosis (ICD10-I70.0). Electronically Signed   By: Zetta Bills M.D.   On: 09/03/2019 18:05   MR 3D Recon At Scanner  Result Date: 09/02/2019 CLINICAL DATA:  Patient with possible pancreatitis. Gallbladder wall thickening on prior right upper quadrant ultrasound. EXAM: MRI ABDOMEN WITHOUT AND WITH CONTRAST (INCLUDING MRCP) TECHNIQUE: Multiplanar multisequence MR imaging of the abdomen was performed both before and after the administration of intravenous contrast. Heavily T2-weighted images of the biliary and pancreatic ducts were obtained, and three-dimensional MRCP images were  rendered by post processing. CONTRAST:  6m GADAVIST GADOBUTROL 1 MMOL/ML IV SOLN COMPARISON:  Ultrasound abdomen 09/01/2009; CT abdomen pelvis 08/04/2019 FINDINGS: Lower chest: Small bilateral pleural effusions. Consolidative opacities within the lower lungs bilaterally. Hepatobiliary: Liver is normal in size and contour. There is irregular wall thickening of the gallbladder. No cholelithiasis. Possible  filling defect within the distal common bile duct on MRCP images (image 30; series 7). No intrahepatic or extrahepatic biliary ductal dilatation. Pancreas:  Unremarkable Spleen:  Unremarkable Adrenals/Urinary Tract: Normal adrenal glands. Kidneys enhance symmetrically with contrast. No hydronephrosis. Stomach/Bowel: Moderate sized hiatal hernia. Otherwise normal morphology of the stomach. No evidence for small bowel obstruction. Vascular/Lymphatic: Normal caliber abdominal aorta. No retroperitoneal lymphadenopathy. Other: There is ascites within the left upper quadrant. Additionally there is perihepatic fluid which appears loculated along the anterior hepatic margin (image 13; series 5). There is patchy enhancement scattered throughout the omentum and colonic mesentery within the central upper abdomen (image 57; series 1202). Musculoskeletal: No aggressive or acute appearing osseous lesions. IMPRESSION: 1. Persistent irregular wall thickening of the gallbladder which is nonspecific in etiology. Findings may be secondary to acute cholecystitis in the appropriate clinical setting. The possibility of gallbladder malignancy is not excluded. 2. There appears to be loculated ascites within the left upper quadrant and within the perihepatic locations, nonspecific in etiology. Additionally, there is patchy enhancement involving the omentum and upper abdominal colonic mesentery. While these findings may be secondary to an acute infectious/inflammatory process, the possibility of omental carcinomatosis and/or malignant  ascites is not entirely excluded, potentially if the gallbladder findings are secondary to a malignancy. 3. Given the multitude of findings, many of which have changed or progressed from prior CT, recommend further evaluation with contrast enhanced CT of the abdomen and pelvis. 4. There is narrowing of the distal aspect of the common bile duct on MRCP images which is likely artifactual in etiology. The possibility of a small distal CBD stone is not entirely excluded. Electronically Signed   By: Lovey Newcomer M.D.   On: 09/02/2019 20:58   DG CHEST PORT 1 VIEW  Result Date: 09/03/2019 CLINICAL DATA:  Hypoxia. EXAM: PORTABLE CHEST 1 VIEW COMPARISON:  One-view chest x-ray 08/08/2019 FINDINGS: Heart size is exaggerated by low lung volumes. Moderate pulmonary vascular congestion is present. Bilateral pleural effusions and basilar airspace opacities are noted. The visualized soft tissues and bony thorax are unremarkable. IMPRESSION: 1. Stable appearance of bilateral pleural effusions and basilar airspace disease. While this likely reflects atelectasis, infection is not excluded. 2. Moderate pulmonary vascular congestion. Electronically Signed   By: San Morelle M.D.   On: 09/03/2019 05:01   MR ABDOMEN MRCP W WO CONTAST  Result Date: 09/02/2019 CLINICAL DATA:  Patient with possible pancreatitis. Gallbladder wall thickening on prior right upper quadrant ultrasound. EXAM: MRI ABDOMEN WITHOUT AND WITH CONTRAST (INCLUDING MRCP) TECHNIQUE: Multiplanar multisequence MR imaging of the abdomen was performed both before and after the administration of intravenous contrast. Heavily T2-weighted images of the biliary and pancreatic ducts were obtained, and three-dimensional MRCP images were rendered by post processing. CONTRAST:  76m GADAVIST GADOBUTROL 1 MMOL/ML IV SOLN COMPARISON:  Ultrasound abdomen 09/01/2009; CT abdomen pelvis 08/04/2019 FINDINGS: Lower chest: Small bilateral pleural effusions. Consolidative  opacities within the lower lungs bilaterally. Hepatobiliary: Liver is normal in size and contour. There is irregular wall thickening of the gallbladder. No cholelithiasis. Possible filling defect within the distal common bile duct on MRCP images (image 30; series 7). No intrahepatic or extrahepatic biliary ductal dilatation. Pancreas:  Unremarkable Spleen:  Unremarkable Adrenals/Urinary Tract: Normal adrenal glands. Kidneys enhance symmetrically with contrast. No hydronephrosis. Stomach/Bowel: Moderate sized hiatal hernia. Otherwise normal morphology of the stomach. No evidence for small bowel obstruction. Vascular/Lymphatic: Normal caliber abdominal aorta. No retroperitoneal lymphadenopathy. Other: There is ascites within the left upper quadrant. Additionally there is  perihepatic fluid which appears loculated along the anterior hepatic margin (image 13; series 5). There is patchy enhancement scattered throughout the omentum and colonic mesentery within the central upper abdomen (image 57; series 1202). Musculoskeletal: No aggressive or acute appearing osseous lesions. IMPRESSION: 1. Persistent irregular wall thickening of the gallbladder which is nonspecific in etiology. Findings may be secondary to acute cholecystitis in the appropriate clinical setting. The possibility of gallbladder malignancy is not excluded. 2. There appears to be loculated ascites within the left upper quadrant and within the perihepatic locations, nonspecific in etiology. Additionally, there is patchy enhancement involving the omentum and upper abdominal colonic mesentery. While these findings may be secondary to an acute infectious/inflammatory process, the possibility of omental carcinomatosis and/or malignant ascites is not entirely excluded, potentially if the gallbladder findings are secondary to a malignancy. 3. Given the multitude of findings, many of which have changed or progressed from prior CT, recommend further evaluation with  contrast enhanced CT of the abdomen and pelvis. 4. There is narrowing of the distal aspect of the common bile duct on MRCP images which is likely artifactual in etiology. The possibility of a small distal CBD stone is not entirely excluded. Electronically Signed   By: Lovey Newcomer M.D.   On: 09/02/2019 20:58   CT IMAGE GUIDED DRAINAGE BY PERCUTANEOUS CATHETER  Result Date: 09/12/2019 INDICATION: 63 year old with intra-abdominal fluid collections. Left abdominal/peri-splenic collection is amenable for percutaneous drainage. EXAM: CT-GUIDED DRAINAGE OF LEFT ABDOMINAL FLUID COLLECTION MEDICATIONS: Moderate sedation ANESTHESIA/SEDATION: 1.0 mg IV Versed 50 mcg IV Fentanyl Moderate Sedation Time:  30 minutes The patient was continuously monitored during the procedure by the interventional radiology nurse under my direct supervision. COMPLICATIONS: None immediate. TECHNIQUE: Informed written consent was obtained from the patient after a thorough discussion of the procedural risks, benefits and alternatives. All questions were addressed. Maximal Sterile Barrier Technique was utilized including caps, mask, sterile gowns, sterile gloves, sterile drape, hand hygiene and skin antiseptic. A timeout was performed prior to the initiation of the procedure. PROCEDURE: Patient was placed supine on the CT scanner. Images through the abdomen were obtained. The left side of the abdomen was prepped with chlorhexidine and sterile field was created. Skin and soft tissues were anesthetized with 1% lidocaine. Using CT guidance, an 18 gauge trocar needle was directed into the perisplenic fluid collection and yellow fluid was aspirated. A stiff Amplatz wire was advanced into the collection. Tract was dilated to accommodate a 10.2 [place] drain. Fluid was aspirated. Catheter was attached to a suction bulb. Catheter was sutured to skin and secured with a StatLock. Dressing was placed. FINDINGS: Large fluid collection in the left abdomen and  peri-splenic region. 10.2 French drain was placed in the collection. Approximately 280 mL of yellow fluid was removed. Small amount of soft tissue or sediment-like material within the fluid. IMPRESSION: CT-guided placement of a drain in the large left abdominal fluid collection. Electronically Signed   By: Markus Daft M.D.   On: 09/12/2019 18:09   US Abdomen Limited RUQ  Result Date: 09/02/2019 CLINICAL DATA:  63 year old male with history of nausea, vomiting and abdominal pain. Under in both to take deep breaths. EXAM: ULTRASOUND ABDOMEN LIMITED RIGHT UPPER QUADRANT COMPARISON:  None. FINDINGS: Gallbladder: No gallstones. Gallbladder wall appears mildly thickened ranging from 5-7 mm in thickness. No pericholecystic fluid. No sonographic Murphy sign noted by sonographer. Common bile duct: Diameter: 3 mm Liver: No focal lesion identified. Within normal limits in parenchymal echogenicity. Portal vein is patent on  color Doppler imaging with normal direction of blood flow towards the liver. Other: None. IMPRESSION: 1. Gallbladder wall appears mildly thickened. This is of uncertain etiology and significance. No gallstones or findings to suggest an acute cholecystitis are noted at this time. Follow-up right upper quadrant abdominal ultrasound is suggested in 2 weeks to ensure resolution of this finding. Should this finding not resolve, further evaluation with abdominal MRI with and without IV gadolinium with MRCP would be suggested to exclude the possibility of gallbladder malignancy. Electronically Signed   By: Vinnie Langton M.D.   On: 09/02/2019 13:13    Microbiology: Recent Results (from the past 240 hour(s))  Surgical PCR screen     Status: None   Collection Time: 09/04/19  6:04 PM   Specimen: Nasal Mucosa; Nasal Swab  Result Value Ref Range Status   MRSA, PCR NEGATIVE NEGATIVE Final   Staphylococcus aureus NEGATIVE NEGATIVE Final    Comment: (NOTE) The Xpert SA Assay (FDA approved for NASAL  specimens in patients 26 years of age and older), is one component of a comprehensive surveillance program. It is not intended to diagnose infection nor to guide or monitor treatment. Performed at [facility], [place] [address] [place], [place] [address]   Fungus Stain     Status: None   Collection Time: 09/05/19  4:56 PM   Specimen: PATH Cytology Peritoneal fluid; Body Fluid  Result Value Ref Range Status   FUNGUS STAIN Final report  Final    Comment: (NOTE) Performed At: [facility] [place], [place] [id number] Rush Farmer MD [id number]    Fungal Source PERITONEAL  Final    Comment: Performed at [facility] [address], [place], [place] [address]  Culture, body fluid-bottle     Status: None   Collection Time: 09/05/19  4:56 PM   Specimen: Peritoneal Washings  Result Value Ref Range Status   Specimen Description PERITONEAL  Final   Special Requests NONE  Final   Culture   Final    NO GROWTH 5 DAYS Performed at [facility], [address], [place], [place] [address]    Report Status 09/11/2019 FINAL  Final  Gram stain     Status: None   Collection Time: 09/05/19  4:56 PM   Specimen: Peritoneal Washings  Result Value Ref Range Status   Specimen Description PERITONEAL  Final   Special Requests NONE  Final   Gram Stain   Final    FEW WBC PRESENT,BOTH PMN AND MONONUCLEAR NO ORGANISMS SEEN Performed at [facility], [address], [place], [place] [address]    Report Status 09/06/2019 FINAL  Final  Fungal Stain reflex     Status: None   Collection Time: 09/05/19  4:56 PM  Result Value Ref Range Status   Fungal stain result 1 Comment  Final    Comment: (NOTE) KOH/Calcofluor preparation:  no fungus observed. Performed At: [facility] [place], [place] [id number] Rush Farmer MD XB:[id number]   Aerobic/Anaerobic Culture (surgical/deep wound)     Status: None  (Preliminary result)   Collection Time: 09/12/19  5:42 PM   Specimen: Abscess  Result Value Ref Range Status   Specimen Description   Final    ABSCESS ABDOMEN LUQ Performed at [facility] [address], [place], [place] [address]    Special Requests   Final    NONE Performed at [facility], [address]  [place], [place] [address]    Gram Stain   Final    ABUNDANT WBC PRESENT,BOTH PMN AND MONONUCLEAR NO ORGANISMS SEEN    Culture   Final    NO GROWTH < 24 HOURS Performed at [facility] [address] [place], [place] [address]    Report Status PENDING  Incomplete     Labs: Basic Metabolic Panel: Recent Labs  Lab 09/09/19 0354 09/10/19 0249 09/11/19 0307 09/12/19 0317 09/13/19 0343  NA 137 136 137 137 137  K 3.6 3.1* 3.5 3.6 3.6  CL 99 101 102 105 104  CO2 _0 GLUCOSE 102* 104* 122* 106* 107*  BUN 12 9 7* 6* 11  CREATININE 0.86 0.80 0.82 0.78 0.87  CALCIUM 8.2* 7.9* 8.1* 8.1* 8.1*   Liver Function Tests: Recent Labs  Lab 09/12/19 0317  AST 25  ALT 33  ALKPHOS 53  BILITOT 0.7  PROT 5.6*  ALBUMIN 2.2*   No results for input(s): LIPASE, AMYLASE in the last 168 hours. No results for input(s): AMMONIA in the last 168 hours. CBC: Recent Labs  Lab 09/09/19 0354 09/10/19 0249 09/11/19 0307 09/12/19 0317 09/13/19 0343  WBC 15.9* 15.7* 15.3* 13.6* 11.5*  NEUTROABS  --   --   --   --  8.4*  HGB 12.2* 11.7* 12.1* 12.2* 11.5*  HCT 37.7* 35.8* 37.0* 37.6* 36.3*  MCV 93.8 93.2 93.2 93.3 95.0  PLT 316 314 374 366 396   Cardiac Enzymes: No results for input(s): CKTOTAL, CKMB, CKMBINDEX, TROPONINI in the last 168 hours. BNP: BNP (last 3 results) Recent Labs    09/04/19 1701  BNP 41.0    ProBNP (last 3 results) No results for input(s): PROBNP in the last 8760 hours.  CBG: Recent Labs  Lab 09/07/19 1551 09/07/19 2028 09/08/19 0041 09/08/19 0431 09/08/19 0747  GLUCAP 105* 104* 97 96 94         Signed:  Alma Friendly, MD [facility] 09/13/2019, 11:45 AM

## 2019-09-13 NOTE — Progress Notes (Signed)
Pt to be discharged to home this evening. Pt and Pt's Wife given discharge teaching including all Medications and schedules of these Medications. Pt and WIfe given teaching regarding flushing of left abd drain and emptying and recharging of JP drain. Understanding verbalized by both Pt and Wife. Discharge packet with pt at time of discharge

## 2019-09-13 NOTE — Care Management Important Message (Signed)
Important Message  Patient Details IM Letter given to Gabriel Earing RN Case Manager to present to the Patient Name: Corey Logan MRN: [id number] Date of Birth: [birth year]   Medicare Important Message Given:  Yes     Kerin Salen 09/13/2019, 3:27 PM

## 2019-09-13 NOTE — Progress Notes (Signed)
Referring Physician(s): Barnetta Chapelsborne, Kelly (CCS)  Supervising Physician: Gilmer MorWagner, Jaime  [facility]  Chief Complaint: None  Subjective:  Left abdominal fluid collection s/p left abdominal drain placement in IR 09/12/2019 by Dr. Lowella DandyHenn. Patient awake and alert sitting in bed watching TV with no complaints at this time. Left abdominal drain site c/d/i.   Allergies: Patient has no known allergies.  Medications: Prior to Admission medications   Medication Sig Start Date End Date Taking? Authorizing Provider  folic acid (FOLVITE) 1 MG tablet Take 1 tablet (1 mg total) by mouth daily. 08/12/19  Yes Sheikh, Omair Latif, DO  Melatonin 5 MG TABS Take 5 mg by mouth at bedtime.   Yes [provider]  Multiple Vitamin (MULTIVITAMIN WITH MINERALS) TABS tablet Take 1 tablet by mouth daily. 08/12/19  Yes Sheikh, Omair Latif, DO  pantoprazole (PROTONIX) 40 MG tablet Take 1 tablet (40 mg total) by mouth daily. 08/12/19  Yes Sheikh, Omair Latif, DO  thiamine 100 MG tablet Take 1 tablet (100 mg total) by mouth daily. 08/12/19  Yes Sheikh, Omair Latif, DO  acetaminophen (TYLENOL) 325 MG tablet Take 2 tablets (650 mg total) by mouth every 6 (six) hours as needed for mild pain (or Fever >/= 101). 09/13/19   Barnetta Chapelsborne, Kelly, PA-C  amoxicillin-clavulanate (AUGMENTIN) 875-125 MG tablet Take 1 tablet by mouth every 12 (twelve) hours for 5 days. 09/13/19 09/18/19  Barnetta Chapelsborne, Kelly, PA-C     Vital Signs: BP 133/82 (BP Location: Right Arm)    Pulse 79    Temp 98.2 F (36.8 C) (Oral)    Resp 18    Ht 5\' 10"  (1.778 m)    Wt 180 lb 8.9 oz (81.9 kg)    SpO2 96%    BMI 25.91 kg/m   Physical Exam Vitals and nursing note reviewed.  Constitutional:      General: He is not in acute distress.    Appearance: Normal appearance.  Pulmonary:     Effort: Pulmonary effort is normal. No respiratory distress.  Abdominal:     Comments: Left abdominal drain site without tenderness, erythema, drainage,  or active bleeding; approximately 50 cc of thick yellow fluid in suction bulb; drain flushes/aspirates without resistance.  Skin:    General: Skin is warm and dry.  Neurological:     Mental Status: He is alert and oriented to person, place, and time.  Psychiatric:        Mood and Affect: Mood normal.        Behavior: Behavior normal.     Imaging: CT ABDOMEN PELVIS W CONTRAST  Result Date: 09/11/2019 CLINICAL DATA:  Abdominal pain. EXAM: CT ABDOMEN AND PELVIS WITH CONTRAST TECHNIQUE: Multidetector CT imaging of the abdomen and pelvis was performed using the standard protocol following bolus administration of intravenous contrast. CONTRAST:  100mL OMNIPAQUE IOHEXOL 300 MG/ML  SOLN COMPARISON:  September 03, 2019. FINDINGS: Lower chest: Minimal bibasilar subsegmental atelectasis is noted. Hepatobiliary: Status post cholecystectomy. There remains a minimal amount of perihepatic fluid. There is interval placement of surgical drain into the infrahepatic region. Pancreas: Unremarkable. No pancreatic ductal dilatation or surrounding inflammatory changes. Spleen: Spleen appears normal, although there is continued presence of 13.6 x 7.4 cm fluid collection with enhancing margins concerning for possible abscess or seroma. It appears to be enlarged compared to prior exam. Adrenals/Urinary Tract: Adrenal glands are unremarkable. Kidneys are normal, without renal calculi, focal lesion, or hydronephrosis. Bladder is unremarkable. Stomach/Bowel: Small sliding-type hiatal hernia is noted.  Stomach is unremarkable. There is no evidence of bowel obstruction or inflammation. Status post appendectomy. Vascular/Lymphatic: Aortic atherosclerosis. No enlarged abdominal or pelvic lymph nodes. Reproductive: Prostate is unremarkable. Other: Also noted is surgical drain entering the left side the abdomen extending into the pelvis. Another surgical drain is seen entering the left upper quadrant which extends through fluid collection  in epigastric region adjacent to left hepatic lobe and above the stomach. This measures 10.9 x 7.8 cm and is slightly enlarged compared to prior exam. It also demonstrates enhancing margins suggesting possible developing abscess or seroma. There are multiple other smaller fluid collection seen in the epigastric region anteriorly with enhancing walls concerning for possible small abscesses. Musculoskeletal: No acute or significant osseous findings. IMPRESSION: Status post cholecystectomy. Interval placement of surgical drain and infrahepatic region with decreased amount of perihepatic fluid compared to prior exam. There is also been interval placement of a no other surgical drain in the left upper quadrant with distal tip passing through fluid collection in epigastric region between left hepatic lobe and stomach. This fluid collection is slightly enlarged with enhancing margin concerning for possible abscess or seroma. Also noted is 13.6 x 7.4 cm fluid collection in the left subdiaphragmatic placed adjacent to the spleen with enhancing margins which is in large compared to prior exam. This also is concerning for possible abscess or seroma. There is also the development of innumerable small fluid collections in the anterior portion of the epigastric region with enhancing walls concerning for multiple small abscesses or seromas. Aortic Atherosclerosis (ICD10-I70.0). Electronically Signed   By: Lupita Raider M.D.   On: 09/11/2019 17:10   CT IMAGE GUIDED DRAINAGE BY PERCUTANEOUS CATHETER  Result Date: 09/12/2019 INDICATION: 63 year old with intra-abdominal fluid collections. Left abdominal/peri-splenic collection is amenable for percutaneous drainage. EXAM: CT-GUIDED DRAINAGE OF LEFT ABDOMINAL FLUID COLLECTION MEDICATIONS: Moderate sedation ANESTHESIA/SEDATION: 1.0 mg IV Versed 50 mcg IV Fentanyl Moderate Sedation Time:  30 minutes The patient was continuously monitored during the procedure by the interventional  radiology nurse under my direct supervision. COMPLICATIONS: None immediate. TECHNIQUE: Informed written consent was obtained from the patient after a thorough discussion of the procedural risks, benefits and alternatives. All questions were addressed. Maximal Sterile Barrier Technique was utilized including caps, mask, sterile gowns, sterile gloves, sterile drape, hand hygiene and skin antiseptic. A timeout was performed prior to the initiation of the procedure. PROCEDURE: Patient was placed supine on the CT scanner. Images through the abdomen were obtained. The left side of the abdomen was prepped with chlorhexidine and sterile field was created. Skin and soft tissues were anesthetized with 1% lidocaine. Using CT guidance, an 18 gauge trocar needle was directed into the perisplenic fluid collection and yellow fluid was aspirated. A stiff Amplatz wire was advanced into the collection. Tract was dilated to accommodate a 10.2 [place] drain. Fluid was aspirated. Catheter was attached to a suction bulb. Catheter was sutured to skin and secured with a StatLock. Dressing was placed. FINDINGS: Large fluid collection in the left abdomen and peri-splenic region. 10.2 French drain was placed in the collection. Approximately 280 mL of yellow fluid was removed. Small amount of soft tissue or sediment-like material within the fluid. IMPRESSION: CT-guided placement of a drain in the large left abdominal fluid collection. Electronically Signed   By: Richarda Overlie M.D.   On: 09/12/2019 18:09    Labs:  CBC: Recent Labs    09/10/19 0249 09/11/19 0307 09/12/19 0317 09/13/19 0343  WBC 15.7* 15.3* 13.6* 11.5*  HGB 11.7* 12.1* 12.2* 11.5*  HCT 35.8* 37.0* 37.6* 36.3*  PLT 314 374 366 396    COAGS: Recent Labs    09/12/19 0834  INR 1.0    BMP: Recent Labs    09/10/19 0249 09/11/19 0307 09/12/19 0317 09/13/19 0343  NA 136 137 137 137  K 3.1* 3.5 3.6 3.6  CL 101 102 105 104  CO2 24 26 23 24   GLUCOSE 104* 122*  106* 107*  BUN 9 7* 6* 11  CALCIUM 7.9* 8.1* 8.1* 8.1*  CREATININE 0.80 0.82 0.78 0.87  GFRNONAA >60 >60 >60 >60  GFRAA >60 >60 >60 >60    LIVER FUNCTION TESTS: Recent Labs    09/04/19 0127 09/05/19 0326 09/06/19 0351 09/12/19 0317  BILITOT 2.0* 1.4* 1.0 0.7  AST 18 12* 36 25  ALT 8 8 26  33  ALKPHOS 38 33* 38 53  PROT 5.6* 5.4* 5.1* 5.6*  ALBUMIN 2.7* 2.5* 2.1* 2.2*    Assessment and Plan:  Left abdominal fluid collection s/p left abdominal drain placement in IR 09/12/2019 by Dr. Anselm Pancoast. Left abdominal drain stable with approximately 50 cc thick yellow fluid in suction bulb (additional 490 cc output in past 24 hours per chart). Anderson Malta, RN aware to teach patient how to flush drain. Continue current drain management- continue with Qshift flushes/monitor of output. Further plans per [facility]- appreciate and agree with management.  If patient is to be discharged, below are discharge instructions: - Flush each drain once daily with 5-10 cc NS flush. - Record output from each drain once daily. - Follow-up at drain clinic 10-14 days after discharge for CT/possible drain injection (assess for possible drain removal)- order placed to facilitate this.  Please call IR with questions/concerns.   Electronically Signed: Earley Abide, PA-C 09/13/2019, 10:01 AM   I spent a total of 25 Minutes at the the patient's bedside AND on the patient's hospital floor or unit, greater than 50% of which was counseling/coordinating care for left abdominal fluid collection s/p drain placement.

## 2019-09-13 NOTE — Discharge Instructions (Signed)
[facility] ° °Please arrive at least 30 min before your appointment to complete your check in paperwork.  If you are unable to arrive 30 min prior to your appointment time we may have to cancel or reschedule you. °LAPAROSCOPIC SURGERY: POST OP INSTRUCTIONS °Always review your discharge instruction sheet given to you by the facility where your surgery was performed. °IF YOU HAVE DISABILITY OR FAMILY LEAVE FORMS, YOU MUST BRING THEM TO THE OFFICE FOR PROCESSING.   °DO NOT GIVE THEM TO YOUR DOCTOR. ° °PAIN CONTROL ° °1. First take acetaminophen (Tylenol) AND/or ibuprofen (Advil) to control your pain after surgery.  Follow directions on package.  Taking acetaminophen (Tylenol) and/or ibuprofen (Advil) regularly after surgery will help to control your pain and lower the amount of prescription pain medication you may need.  You should not take more than 4,000 mg (4 grams) of acetaminophen (Tylenol) in 24 hours.  You should not take ibuprofen (Advil), aleve, motrin, naprosyn or other NSAIDS if you have a history of stomach ulcers or chronic kidney disease.  °2. A prescription for pain medication may be given to you upon discharge.  Take your pain medication as prescribed, if you still have uncontrolled pain after taking acetaminophen (Tylenol) or ibuprofen (Advil). °3. Use ice packs to help control pain. °4. If you need a refill on your pain medication, please contact your pharmacy.  They will contact our office to request authorization. Prescriptions will not be filled after 5pm or on week-ends. ° °HOME MEDICATIONS °5. Take your usually prescribed medications unless otherwise directed. ° °DIET °6. You should follow a light diet the first few days after arrival home.  Be sure to include lots of fluids daily. Avoid fatty, fried foods.  ° °CONSTIPATION °7. It is common to experience some constipation after surgery and if you are taking pain medication.  Increasing fluid intake and taking a stool  softener (such as Colace) will usually help or prevent this problem from occurring.  A mild laxative (Milk of Magnesia or Miralax) should be taken according to package instructions if there are no bowel movements after 48 hours. ° °WOUND/INCISION CARE °8. Most patients will experience some swelling and bruising in the area of the incisions.  Ice packs will help.  Swelling and bruising can take several days to resolve.  °9. Unless discharge instructions indicate otherwise, follow guidelines below  °a. STERI-STRIPS - you may remove your outer bandages 48 hours after surgery, and you may shower at that time.  You have steri-strips (small skin tapes) in place directly over the incision.  These strips should be left on the skin for 7-10 days.   °b. DERMABOND/SKIN GLUE - you may shower in 24 hours.  The glue will flake off over the next 2-3 weeks. °10. Any sutures or staples will be removed at the office during your follow-up visit. ° °ACTIVITIES °11. You may resume regular (light) daily activities beginning the next day--such as daily self-care, walking, climbing stairs--gradually increasing activities as tolerated.  You may have sexual intercourse when it is comfortable.  Refrain from any heavy lifting or straining until approved by your doctor. °a. You may drive when you are no longer taking prescription pain medication, you can comfortably wear a seatbelt, and you can safely maneuver your car and apply brakes. ° °FOLLOW-UP °12. You should see your doctor in the office for a follow-up appointment approximately 2-3 weeks after your surgery.  You should have been given your post-op/follow-up appointment when   your surgery was scheduled.  If you did not receive a post-op/follow-up appointment, make sure that you call for this appointment within a day or two after you arrive home to insure a convenient appointment time. ° ° °WHEN TO CALL YOUR DOCTOR: °1. Fever over 101.0 °2. Inability to urinate °3. Continued bleeding from  incision. °4. Increased pain, redness, or drainage from the incision. °5. Increasing abdominal pain ° °The clinic staff is available to answer your questions during regular business hours.  Please don’t hesitate to call and ask to speak to one of the nurses for clinical concerns.  If you have a medical emergency, go to the nearest emergency room or call 911.  A surgeon from [facility] is always on call at the hospital. °[address], [address], [place], [place]  [address] ? [address] [address], , [place]   [address] °[phone] ? [phone] ? FAX [phone] ° °

## 2019-09-13 NOTE — TOC Progression Note (Signed)
Transition of Care [facility]) - Progression Note    Patient Details  Name: Corey Logan MRN: [id number] Date of Birth: 1955/12/14  Transition of Care [facility]) CM/SW Contact  Purcell Mouton, RN Phone Number: 09/13/2019, 3:04 PM  Clinical Narrative:    Pt will discharge with no needs at present time.         Expected Discharge Plan and Services           Expected Discharge Date: 09/13/19                                     Social Determinants of Health (SDOH) Interventions    Readmission Risk Interventions No flowsheet data found.

## 2019-09-13 NOTE — Progress Notes (Signed)
Patient ID: Corey Logan, male   DOB: [birth year], 63 y.o.   MRN: [id number]    8 Days Post-Op  Subjective: Patient feels well today.  Tolerating a regular diet.  No pain.  Got IR drain yesterday.  ROS: See above, otherwise other systems negative  Objective: Vital signs in last 24 hours: Temp:  [98.2 F (36.8 C)-98.7 F (37.1 C)] 98.2 F (36.8 C) (12/10 0538) Pulse Rate:  [79-92] 79 (12/10 0538) Resp:  [15-20] 18 (12/10 0538) BP: (111-140)/(59-85) 133/82 (12/10 0538) SpO2:  [96 %-100 %] 96 % (12/10 0538) Last BM Date: 09/11/19  Intake/Output from previous day: 12/09 0701 - 12/10 0700 In: 191.5 [I.V.:58.2; IV Piggyback:133.4] Out: 1310 [Urine:775; Drains:535] Intake/Output this shift: No intake/output data recorded.  PE: Abd: soft, RUQ drain with thick, tan output, still "snot-like", LUQ drain with bile stained thin serous output (not surprised by green tinge given prior copious bilious ascites)  Incisions are c/d/i  Lab Results:  Recent Labs    09/12/19 0317 09/13/19 0343  WBC 13.6* 11.5*  HGB 12.2* 11.5*  HCT 37.6* 36.3*  PLT 366 396   BMET Recent Labs    09/12/19 0317 09/13/19 0343  NA 137 137  K 3.6 3.6  CL 105 104  CO2 23 24  GLUCOSE 106* 107*  BUN 6* 11  CREATININE 0.78 0.87  CALCIUM 8.1* 8.1*   PT/INR Recent Labs    09/12/19 0834  LABPROT 13.1  INR 1.0   CMP     Component Value Date/Time   NA 137 09/13/2019 0343   K 3.6 09/13/2019 0343   CL 104 09/13/2019 0343   CO2 24 09/13/2019 0343   GLUCOSE 107 (H) 09/13/2019 0343   BUN 11 09/13/2019 0343   CREATININE 0.87 09/13/2019 0343   CALCIUM 8.1 (L) 09/13/2019 0343   PROT 5.6 (L) 09/12/2019 0317   ALBUMIN 2.2 (L) 09/12/2019 0317   AST 25 09/12/2019 0317   ALT 33 09/12/2019 0317   ALKPHOS 53 09/12/2019 0317   BILITOT 0.7 09/12/2019 0317   GFRNONAA >60 09/13/2019 0343   GFRAA >60 09/13/2019 0343   Lipase     Component Value Date/Time   LIPASE 55 (H) 09/02/2019 1127        Studies/Results: CT ABDOMEN PELVIS W CONTRAST  Result Date: 09/11/2019 CLINICAL DATA:  Abdominal pain. EXAM: CT ABDOMEN AND PELVIS WITH CONTRAST TECHNIQUE: Multidetector CT imaging of the abdomen and pelvis was performed using the standard protocol following bolus administration of intravenous contrast. CONTRAST:  100mL OMNIPAQUE IOHEXOL 300 MG/ML  SOLN COMPARISON:  September 03, 2019. FINDINGS: Lower chest: Minimal bibasilar subsegmental atelectasis is noted. Hepatobiliary: Status post cholecystectomy. There remains a minimal amount of perihepatic fluid. There is interval placement of surgical drain into the infrahepatic region. Pancreas: Unremarkable. No pancreatic ductal dilatation or surrounding inflammatory changes. Spleen: Spleen appears normal, although there is continued presence of 13.6 x 7.4 cm fluid collection with enhancing margins concerning for possible abscess or seroma. It appears to be enlarged compared to prior exam. Adrenals/Urinary Tract: Adrenal glands are unremarkable. Kidneys are normal, without renal calculi, focal lesion, or hydronephrosis. Bladder is unremarkable. Stomach/Bowel: Small sliding-type hiatal hernia is noted. Stomach is unremarkable. There is no evidence of bowel obstruction or inflammation. Status post appendectomy. Vascular/Lymphatic: Aortic atherosclerosis. No enlarged abdominal or pelvic lymph nodes. Reproductive: Prostate is unremarkable. Other: Also noted is surgical drain entering the left side the abdomen extending into the pelvis. Another surgical drain is seen entering the left upper quadrant  which extends through fluid collection in epigastric region adjacent to left hepatic lobe and above the stomach. This measures 10.9 x 7.8 cm and is slightly enlarged compared to prior exam. It also demonstrates enhancing margins suggesting possible developing abscess or seroma. There are multiple other smaller fluid collection seen in the epigastric region anteriorly  with enhancing walls concerning for possible small abscesses. Musculoskeletal: No acute or significant osseous findings. IMPRESSION: Status post cholecystectomy. Interval placement of surgical drain and infrahepatic region with decreased amount of perihepatic fluid compared to prior exam. There is also been interval placement of a no other surgical drain in the left upper quadrant with distal tip passing through fluid collection in epigastric region between left hepatic lobe and stomach. This fluid collection is slightly enlarged with enhancing margin concerning for possible abscess or seroma. Also noted is 13.6 x 7.4 cm fluid collection in the left subdiaphragmatic placed adjacent to the spleen with enhancing margins which is in large compared to prior exam. This also is concerning for possible abscess or seroma. There is also the development of innumerable small fluid collections in the anterior portion of the epigastric region with enhancing walls concerning for multiple small abscesses or seromas. Aortic Atherosclerosis (ICD10-I70.0). Electronically Signed   By: Marijo Conception M.D.   On: 09/11/2019 17:10   CT IMAGE GUIDED DRAINAGE BY PERCUTANEOUS CATHETER  Result Date: 09/12/2019 INDICATION: 63 year old with intra-abdominal fluid collections. Left abdominal/peri-splenic collection is amenable for percutaneous drainage. EXAM: CT-GUIDED DRAINAGE OF LEFT ABDOMINAL FLUID COLLECTION MEDICATIONS: Moderate sedation ANESTHESIA/SEDATION: 1.0 mg IV Versed 50 mcg IV Fentanyl Moderate Sedation Time:  30 minutes The patient was continuously monitored during the procedure by the interventional radiology nurse under my direct supervision. COMPLICATIONS: None immediate. TECHNIQUE: Informed written consent was obtained from the patient after a thorough discussion of the procedural risks, benefits and alternatives. All questions were addressed. Maximal Sterile Barrier Technique was utilized including caps, mask, sterile  gowns, sterile gloves, sterile drape, hand hygiene and skin antiseptic. A timeout was performed prior to the initiation of the procedure. PROCEDURE: Patient was placed supine on the CT scanner. Images through the abdomen were obtained. The left side of the abdomen was prepped with chlorhexidine and sterile field was created. Skin and soft tissues were anesthetized with 1% lidocaine. Using CT guidance, an 18 gauge trocar needle was directed into the perisplenic fluid collection and yellow fluid was aspirated. A stiff Amplatz wire was advanced into the collection. Tract was dilated to accommodate a 10.2 [place] drain. Fluid was aspirated. Catheter was attached to a suction bulb. Catheter was sutured to skin and secured with a StatLock. Dressing was placed. FINDINGS: Large fluid collection in the left abdomen and peri-splenic region. 10.2 French drain was placed in the collection. Approximately 280 mL of yellow fluid was removed. Small amount of soft tissue or sediment-like material within the fluid. IMPRESSION: CT-guided placement of a drain in the large left abdominal fluid collection. Electronically Signed   By: Markus Daft M.D.   On: 09/12/2019 18:09    Anti-infectives: Anti-infectives (From admission, onward)   Start     Dose/Rate Route Frequency Ordered Stop   09/13/19 1000  amoxicillin-clavulanate (AUGMENTIN) 875-125 MG per tablet 1 tablet     1 tablet Oral Every 12 hours 09/13/19 0933     09/13/19 0000  amoxicillin-clavulanate (AUGMENTIN) 875-125 MG tablet     1 tablet Oral Every 12 hours 09/13/19 0933 09/18/19 2359   09/11/19 1800  piperacillin-tazobactam (ZOSYN) IVPB 3.375 g  Status:  Discontinued     3.375 g 12.5 mL/hr over 240 Minutes Intravenous Every 8 hours 09/11/19 1648 09/13/19 0933   09/04/19 0000  piperacillin-tazobactam (ZOSYN) IVPB 3.375 g     3.375 g 12.5 mL/hr over 240 Minutes Intravenous Every 8 hours 09/03/19 1828 09/10/19 2100   09/03/19 1830  piperacillin-tazobactam (ZOSYN) IVPB  3.375 g     3.375 g 100 mL/hr over 30 Minutes Intravenous STAT 09/03/19 1828 09/03/19 1916       Assessment/Plan H/o heavy ETOH abuse with recent DTs - states he has abstained since recent admission HTN   POD8, s/p dx lap with drainage of bilious ascites, cholecystectomy, biopsy of appendiceal epiploicum, Dr. Ezzard Standing 12/2 -unclear etiology still of diffuse ascites, but did not appear to be secondary to the gallbladder- this was removed to take this off table as etiology -significant saponification was noted but this was expected secondary to bilious ascites. -small vascular nodule noted on an appendiceal epiploicum that was biopsied -ALL CX, PATH, ETC, ARE NEGATIVE FOR GROWTH AND NO EVIDENCE OF MALIGNANCY.  -WBC down to 11K today.  Stop zosyn and give 5 more days of augmentin at home. -regular diet -IR to discuss drain flushes and care with patient and arrange for repeat CT scan as outpatient. -patient will follow up with Dr. Ezzard Standing in about 2 weeks for possible surgical drain removal. -pt is stable for DC home at this time.  FEN -regular diet VTE -SQH, SCDs ID -zosyn 12/1-->12/7, 12/8 -->12/10, Augmentin 12/10-->12/15   LOS: 10 days    Letha Cape , [facility] 09/13/2019, 9:34 AM Please see Amion for pager number during day hours 7:00am-4:30pm

## 2019-09-17 ENCOUNTER — Other Ambulatory Visit (HOSPITAL_COMMUNITY): Payer: Self-pay | Admitting: Student

## 2019-09-17 ENCOUNTER — Other Ambulatory Visit: Payer: Self-pay | Admitting: Surgery

## 2019-09-17 ENCOUNTER — Other Ambulatory Visit (HOSPITAL_COMMUNITY): Payer: Self-pay | Admitting: Diagnostic Radiology

## 2019-09-17 DIAGNOSIS — K651 Peritoneal abscess: Secondary | ICD-10-CM

## 2019-09-18 LAB — AEROBIC/ANAEROBIC CULTURE W GRAM STAIN (SURGICAL/DEEP WOUND): Culture: NO GROWTH

## 2019-09-20 ENCOUNTER — Ambulatory Visit
Admission: RE | Admit: 2019-09-20 | Discharge: 2019-09-20 | Disposition: A | Discharge status: Home / Self Care | Payer: PPO | Source: Ambulatory Visit | Attending: Student | Admitting: Student

## 2019-09-20 ENCOUNTER — Ambulatory Visit
Admission: RE | Admit: 2019-09-20 | Discharge: 2019-09-20 | Disposition: A | Discharge status: Home / Self Care | Payer: PPO | Source: Ambulatory Visit | Attending: Surgery | Admitting: Surgery

## 2019-09-20 ENCOUNTER — Encounter: Payer: Self-pay | Admitting: Radiology

## 2019-09-20 DIAGNOSIS — K811 Chronic cholecystitis: Secondary | ICD-10-CM | POA: Diagnosis not present

## 2019-09-20 DIAGNOSIS — K651 Peritoneal abscess: Secondary | ICD-10-CM | POA: Diagnosis not present

## 2019-09-20 DIAGNOSIS — R188 Other ascites: Secondary | ICD-10-CM | POA: Diagnosis not present

## 2019-09-20 HISTORY — PX: IR RADIOLOGIST EVAL & MGMT: IMG5224

## 2019-09-20 MED ORDER — IOPAMIDOL (ISOVUE-300) INJECTION 61%
100.0000 mL | Freq: Once | INTRAVENOUS | Status: AC | PRN
  Administered 2019-09-20: 100 mL via INTRAVENOUS

## 2019-09-20 NOTE — Progress Notes (Signed)
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 multisequence MR imaging of the abdomen was performed both before and after the administration of intravenous contrast. Heavily T2-weighted images of the biliary and pancreatic ducts were obtained, and three-dimensional MRCP images were rendered by post processing. CONTRAST:  31mL GADAVIST GADOBUTROL 1 MMOL/ML IV SOLN COMPARISON:  Ultrasound abdomen 09/01/2009; CT abdomen pelvis 08/04/2019 FINDINGS: Lower chest: Small bilateral pleural effusions. Consolidative opacities within the lower lungs bilaterally. Hepatobiliary: Liver is normal in size and contour. There is irregular wall thickening of the gallbladder. No cholelithiasis. Possible filling defect within the distal common bile duct on MRCP images (image 30; series 7). No intrahepatic or extrahepatic biliary ductal dilatation. Pancreas:  Unremarkable Spleen:  Unremarkable Adrenals/Urinary Tract: Normal adrenal glands. Kidneys enhance symmetrically with contrast. No hydronephrosis. Stomach/Bowel: Moderate sized hiatal hernia. Otherwise normal morphology of the stomach. No evidence for small bowel obstruction. Vascular/Lymphatic: Normal caliber abdominal aorta. No retroperitoneal lymphadenopathy.  Other: There is ascites within the left upper quadrant. Additionally there is perihepatic fluid which appears loculated along the anterior hepatic margin (image 13; series 5). There is patchy enhancement scattered throughout the omentum and colonic mesentery within the central upper abdomen (image 57; series 1202). Musculoskeletal: No aggressive or acute appearing osseous lesions. IMPRESSION: 1. Persistent irregular wall thickening of the gallbladder which is nonspecific in etiology. Findings may be secondary to acute cholecystitis in the appropriate clinical setting. The possibility of gallbladder malignancy is not excluded. 2. There appears to be loculated ascites within the left upper quadrant and within the perihepatic locations, nonspecific in etiology. Additionally, there is patchy enhancement involving the omentum and upper abdominal colonic mesentery. While these findings may be secondary to an acute infectious/inflammatory process, the possibility of omental carcinomatosis and/or malignant ascites is not entirely excluded, potentially if the gallbladder findings are secondary to a malignancy. 3. Given the multitude of findings, many of which have changed or progressed from prior CT, recommend further evaluation with contrast enhanced CT of the abdomen and pelvis. 4. There is narrowing of the distal aspect of the common bile duct on MRCP images which is likely artifactual in etiology. The possibility of a small distal CBD stone is not entirely excluded. Electronically Signed   By: Lovey Newcomer M.D.   On: 09/02/2019 20:58   DG CHEST PORT 1 VIEW  Result Date: 09/03/2019 CLINICAL DATA:  Hypoxia. EXAM: PORTABLE CHEST 1 VIEW COMPARISON:  One-view chest x-ray 08/08/2019 FINDINGS: Heart size is exaggerated by low lung volumes. Moderate pulmonary vascular congestion is present. Bilateral pleural effusions and basilar airspace opacities are noted. The visualized soft tissues and bony thorax are unremarkable.  IMPRESSION: 1. Stable appearance of bilateral pleural effusions and basilar airspace disease. While this likely reflects atelectasis, infection is not excluded. 2. Moderate pulmonary vascular congestion. Electronically  Signed   By: Marin Roberts M.D.   On: 09/03/2019 05:01   MR ABDOMEN MRCP W WO CONTAST  Result Date: 09/02/2019 CLINICAL DATA:  Patient with possible pancreatitis. Gallbladder wall thickening on prior right upper quadrant ultrasound. EXAM: MRI ABDOMEN WITHOUT AND WITH CONTRAST (INCLUDING MRCP) TECHNIQUE: Multiplanar multisequence MR imaging of the abdomen was performed both before and after the administration of intravenous contrast. Heavily T2-weighted images of the biliary and pancreatic ducts were obtained, and three-dimensional MRCP images were rendered by post processing. CONTRAST:  33mL GADAVIST GADOBUTROL 1 MMOL/ML IV SOLN COMPARISON:  Ultrasound abdomen 09/01/2009; CT abdomen pelvis 08/04/2019 FINDINGS: Lower chest: Small bilateral pleural effusions. Consolidative opacities within the lower lungs bilaterally. Hepatobiliary: Liver is normal in size and contour. There is irregular wall thickening of the gallbladder. No cholelithiasis. Possible filling defect within the distal common bile duct on MRCP images (image 30; series 7). No intrahepatic or extrahepatic biliary ductal dilatation. Pancreas:  Unremarkable Spleen:  Unremarkable Adrenals/Urinary Tract: Normal adrenal glands. Kidneys enhance symmetrically with contrast. No hydronephrosis. Stomach/Bowel: Moderate sized hiatal hernia. Otherwise normal morphology of the stomach. No evidence for small bowel obstruction. Vascular/Lymphatic: Normal caliber abdominal aorta. No retroperitoneal lymphadenopathy. Other: There is ascites within the left upper quadrant. Additionally there is perihepatic fluid which appears loculated along the anterior hepatic margin (image 13; series 5). There is patchy enhancement scattered throughout the  omentum and colonic mesentery within the central upper abdomen (image 57; series 1202). Musculoskeletal: No aggressive or acute appearing osseous lesions. IMPRESSION: 1. Persistent irregular wall thickening of the gallbladder which is nonspecific in etiology. Findings may be secondary to acute cholecystitis in the appropriate clinical setting. The possibility of gallbladder malignancy is not excluded. 2. There appears to be loculated ascites within the left upper quadrant and within the perihepatic locations, nonspecific in etiology. Additionally, there is patchy enhancement involving the omentum and upper abdominal colonic mesentery. While these findings may be secondary to an acute infectious/inflammatory process, the possibility of omental carcinomatosis and/or malignant ascites is not entirely excluded, potentially if the gallbladder findings are secondary to a malignancy. 3. Given the multitude of findings, many of which have changed or progressed from prior CT, recommend further evaluation with contrast enhanced CT of the abdomen and pelvis. 4. There is narrowing of the distal aspect of the common bile duct on MRCP images which is likely artifactual in etiology. The possibility of a small distal CBD stone is not entirely excluded. Electronically Signed   By: Annia Belt M.D.   On: 09/02/2019 20:58   CT IMAGE GUIDED DRAINAGE BY PERCUTANEOUS CATHETER  Result Date: 09/12/2019 INDICATION: 63 year old with intra-abdominal fluid collections. Left abdominal/peri-splenic collection is amenable for percutaneous drainage. EXAM: CT-GUIDED DRAINAGE OF LEFT ABDOMINAL FLUID COLLECTION MEDICATIONS: Moderate sedation ANESTHESIA/SEDATION: 1.0 mg IV Versed 50 mcg IV Fentanyl Moderate Sedation Time:  30 minutes The patient was continuously monitored during the procedure by the interventional radiology nurse under my direct supervision. COMPLICATIONS: None immediate. TECHNIQUE: Informed written consent was obtained from the  patient after a thorough discussion of the procedural risks, benefits and alternatives. All questions were addressed. Maximal Sterile Barrier Technique was utilized including caps, mask, sterile gowns, sterile gloves, sterile drape, hand hygiene and skin antiseptic. A timeout was performed prior to the initiation of the procedure. PROCEDURE: Patient was placed supine on the CT scanner. Images through the abdomen were obtained. The left side of the abdomen was prepped with chlorhexidine and sterile field was created. Skin and soft tissues were  anesthetized with 1% lidocaine. Using CT guidance, an 18 gauge trocar needle was directed into the perisplenic fluid collection and yellow fluid was aspirated. A stiff Amplatz wire was advanced into the collection. Tract was dilated to accommodate a 10.2 [place] drain. Fluid was aspirated. Catheter was attached to a suction bulb. Catheter was sutured to skin and secured with a StatLock. Dressing was placed. FINDINGS: Large fluid collection in the left abdomen and peri-splenic region. 10.2 French drain was placed in the collection. Approximately 280 mL of yellow fluid was removed. Small amount of soft tissue or sediment-like material within the fluid. IMPRESSION: CT-guided placement of a drain in the large left abdominal fluid collection. Electronically Signed   By: Richarda OverlieAdam  Henn M.D.   On: 09/12/2019 18:09   IR Radiologist Eval & Mgmt  Result Date: 09/20/2019 Please refer to notes tab for details about interventional procedure. (Op Note)  US Abdomen Limited RUQ  Result Date: 09/02/2019 CLINICAL DATA:  63 year old male with history of nausea, vomiting and abdominal pain. Under in both to take deep breaths. EXAM: ULTRASOUND ABDOMEN LIMITED RIGHT UPPER QUADRANT COMPARISON:  None. FINDINGS: Gallbladder: No gallstones. Gallbladder wall appears mildly thickened ranging from 5-7 mm in thickness. No pericholecystic fluid. No sonographic Murphy sign noted by sonographer. Common  bile duct: Diameter: 3 mm Liver: No focal lesion identified. Within normal limits in parenchymal echogenicity. Portal vein is patent on color Doppler imaging with normal direction of blood flow towards the liver. Other: None. IMPRESSION: 1. Gallbladder wall appears mildly thickened. This is of uncertain etiology and significance. No gallstones or findings to suggest an acute cholecystitis are noted at this time. Follow-up right upper quadrant abdominal ultrasound is suggested in 2 weeks to ensure resolution of this finding. Should this finding not resolve, further evaluation with abdominal MRI with and without IV gadolinium with MRCP would be suggested to exclude the possibility of gallbladder malignancy. Electronically Signed   By: Trudie Reedaniel  Entrikin M.D.   On: 09/02/2019 13:13    Labs:  CBC: Recent Labs    09/10/19 0249 09/11/19 0307 09/12/19 0317 09/13/19 0343  WBC 15.7* 15.3* 13.6* 11.5*  HGB 11.7* 12.1* 12.2* 11.5*  HCT 35.8* 37.0* 37.6* 36.3*  PLT 314 374 366 396    COAGS: Recent Labs    09/12/19 0834  INR 1.0    BMP: Recent Labs    09/10/19 0249 09/11/19 0307 09/12/19 0317 09/13/19 0343  NA 136 137 137 137  K 3.1* 3.5 3.6 3.6  CL 101 102 105 104  CO2 24 26 23 24   GLUCOSE 104* 122* 106* 107*  BUN 9 7* 6* 11  CALCIUM 7.9* 8.1* 8.1* 8.1*  CREATININE 0.80 0.82 0.78 0.87  GFRNONAA >60 >60 >60 >60  GFRAA >60 >60 >60 >60    LIVER FUNCTION TESTS: Recent Labs    09/04/19 0127 09/05/19 0326 09/06/19 0351 09/12/19 0317  BILITOT 2.0* 1.4* 1.0 0.7  AST 18 12* 36 25  ALT 8 8 26  33  ALKPHOS 38 33* 38 53  PROT 5.6* 5.4* 5.1* 5.6*  ALBUMIN 2.7* 2.5* 2.1* 2.2*    TUMOR MARKERS: No results for input(s): AFPTM, CEA, CA199, CHROMGRNA in the last 8760 hours.  Assessment and Plan:  Due to persistent output of approximately 40 cc/day, we will leave the drainage catheter in place.  Given that his fluid was not infected, further imaging is unlikely to be of utility.  Once  drain output is minimal for several days, this drain can be removed.  1.)  Follow-up clinic visit in 2-3 weeks after the holidays.  If drain output is scant at that time, consider drain removal.   Electronically Signed: Malachy Moan 09/20/2019, 4:11 PM   I spent a total of  15 Minutes in face to face in clinical consultation, greater than 50% of which was counseling/coordinating care for abscess drain

## 2019-10-02 ENCOUNTER — Other Ambulatory Visit: Payer: Self-pay | Admitting: Surgery

## 2019-10-02 DIAGNOSIS — K651 Peritoneal abscess: Secondary | ICD-10-CM

## 2019-10-09 ENCOUNTER — Encounter: Payer: Self-pay | Admitting: *Deleted

## 2019-10-09 ENCOUNTER — Ambulatory Visit
Admission: RE | Admit: 2019-10-09 | Discharge: 2019-10-09 | Disposition: A | Discharge status: Home / Self Care | Payer: PPO | Source: Ambulatory Visit | Attending: Surgery | Admitting: Surgery

## 2019-10-09 DIAGNOSIS — K651 Peritoneal abscess: Secondary | ICD-10-CM | POA: Diagnosis not present

## 2019-10-09 HISTORY — PX: IR RADIOLOGIST EVAL & MGMT: IMG5224
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
## 2020-03-24 DIAGNOSIS — H6123 Impacted cerumen, bilateral: Secondary | ICD-10-CM | POA: Diagnosis not present

## 2020-05-03 IMAGING — MR MR ABDOMEN WO/W CM MRCP
13 of 22 series · 20 of 48 positions shown · IV contrast (gadavist)
Comparison: Ultrasound abdomen 09/01/2009; CT abdomen pelvis
08/04/2019

CLINICAL DATA: Patient with possible pancreatitis. Gallbladder wall
thickening on prior right upper quadrant ultrasound.

EXAM:
MRI ABDOMEN WITHOUT AND WITH CONTRAST (INCLUDING MRCP)
TECHNIQUE: Multiplanar multisequence MR imaging of the abdomen was performed
both before and after the administration of intravenous contrast.
Heavily T2-weighted images of the biliary and pancreatic ducts were
obtained, and three-dimensional MRCP images were rendered by post
processing.
CONTRAST:  9mL GADAVIST GADOBUTROL 1 MMOL/ML IV SOLN

[Series 4: DWI b500 · axial · 6.0mm · 1.76mm/px · z∈[-47,+250]mm · 2 of 78 slices shown]
[im 1/78]
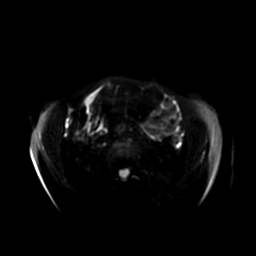
[im 78/78]
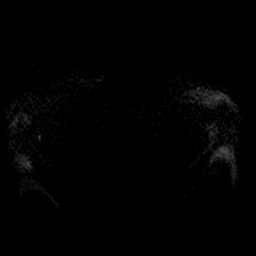

[Series 5: T2 fat-sat · axial · 6.0mm · 0.78mm/px · 1 of 36 slices shown]
[im 1/36]
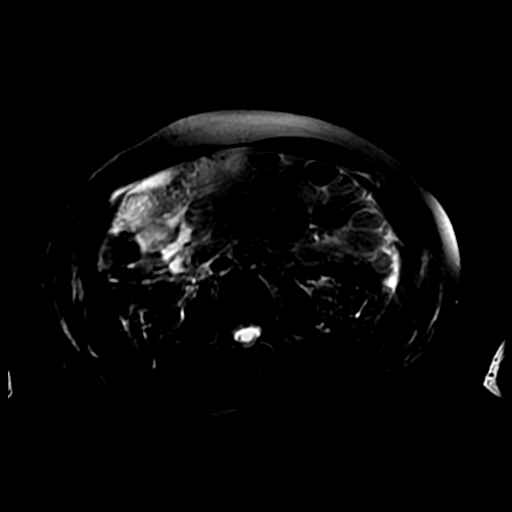

[Series 7: MRCP · coronal · 2.0mm · 0.70mm/px · 1 of 43 slices shown (1 of 2)]
[im 1/43]
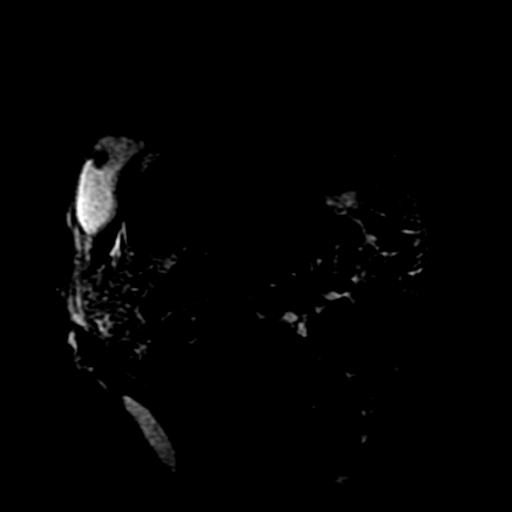

[Series 8: T2 · coronal · 5.0mm · 0.74mm/px · 1 of 34 slices shown (1 of 2)]
[im 1/34]
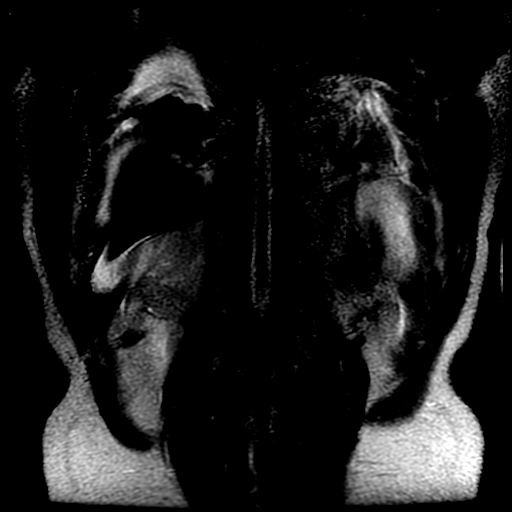

[Series 9: T2 · axial · 5.0mm · 0.86mm/px · z∈[-3,+227]mm · 2 of 47 slices shown (2 of 2)]
[im 1/47]
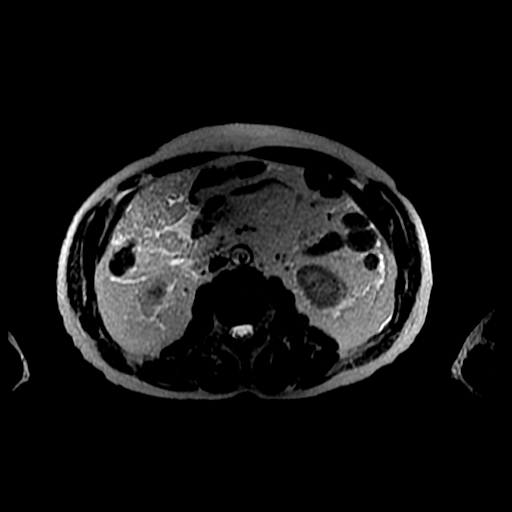
[im 47/47]
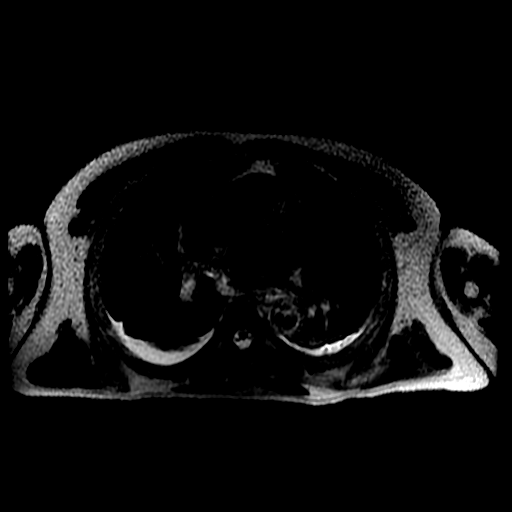

[Series 10: MRCP · coronal · 40.0mm · 0.70mm/px · 1 of 9 slices shown (2 of 2)]
[im 1/9]
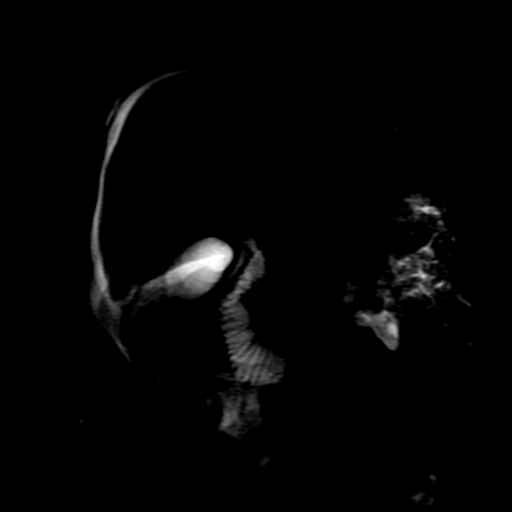

[Series 11: ax dualecho bh · axial · 5.0mm · 0.86mm/px · z∈[+15,+235]mm · 3 of 90 slices shown]
[im 1/90]
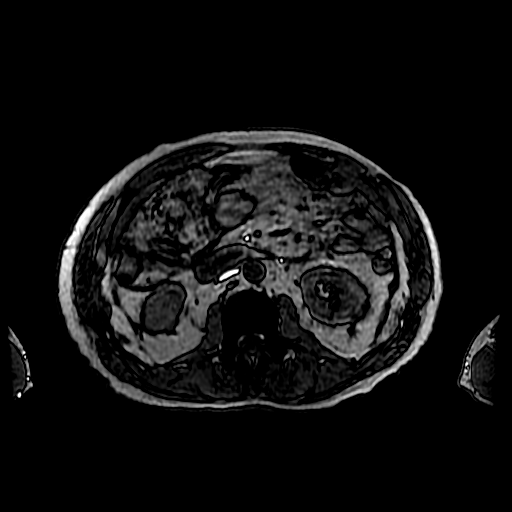
[im 45/90]
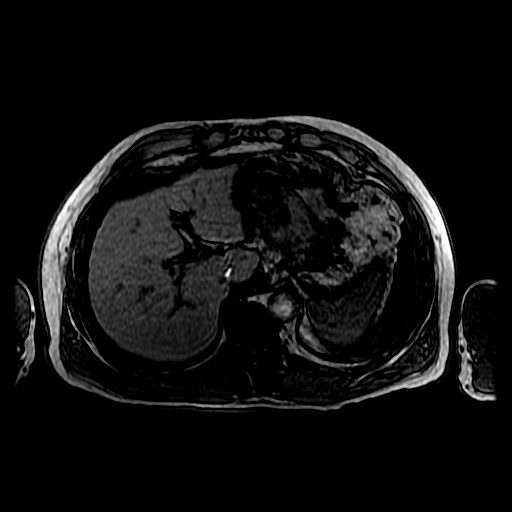
[im 90/90]
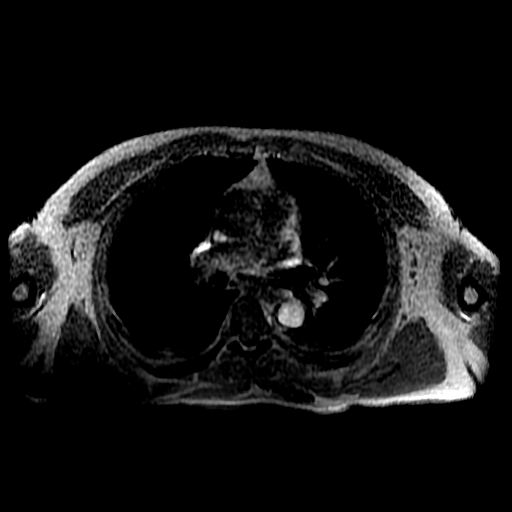

[Series 13: T1 dynamic · coronal · delayed · 4.0mm · 0.86mm/px · 3 of 80 slices shown]
[im 1/80]
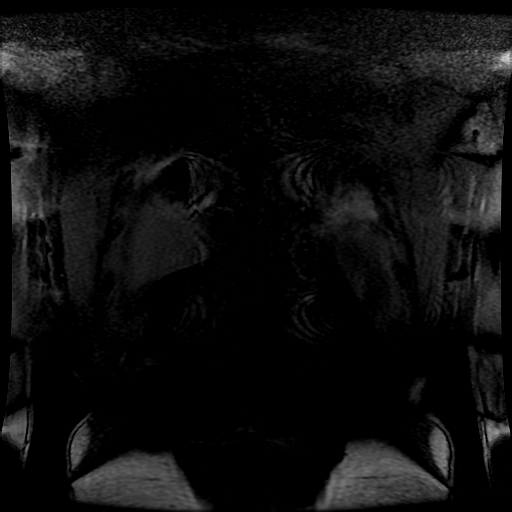
[im 40/80]
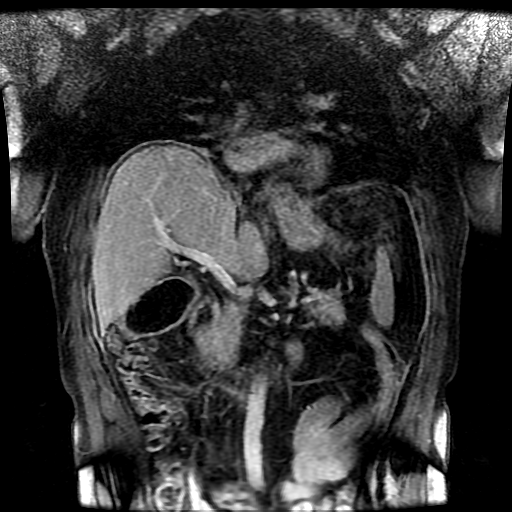
[im 80/80]
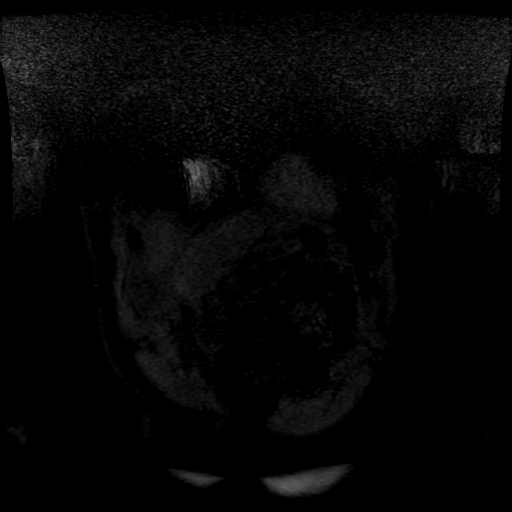

[Series 401: DWI · axial · 6.0mm · 1.76mm/px · 1 of 39 slices shown]
[im 1/39]
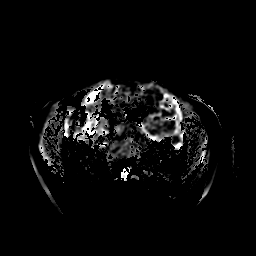

[Series 600: pjn · sagittal · 1.6mm · 0.62mm/px · 1 of 7 slices shown (1 of 4)]
[im 1/7]
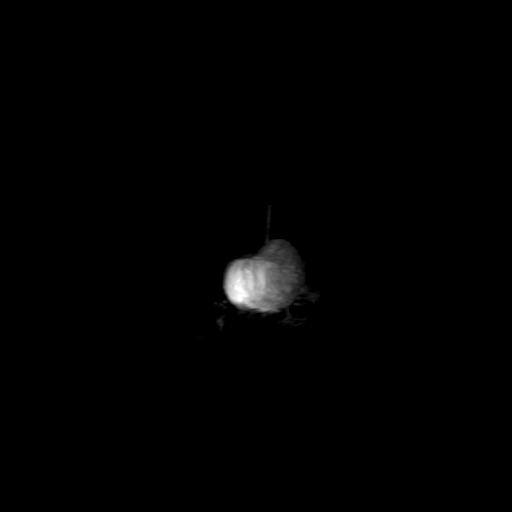

[Series 601: pjn · axial · 1.6mm · 0.62mm/px · 1 of 16 slices shown (2 of 4)]
[im 1/16]
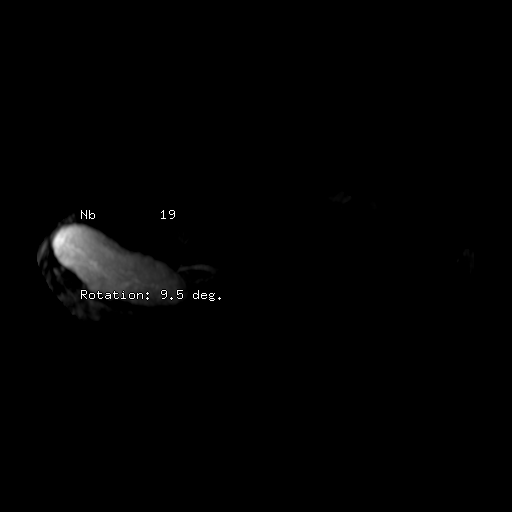

[Series 602: pjn · sagittal · 0.6mm · 0.62mm/px · 2 of 49 slices shown (3 of 4)]
[im 1/49]
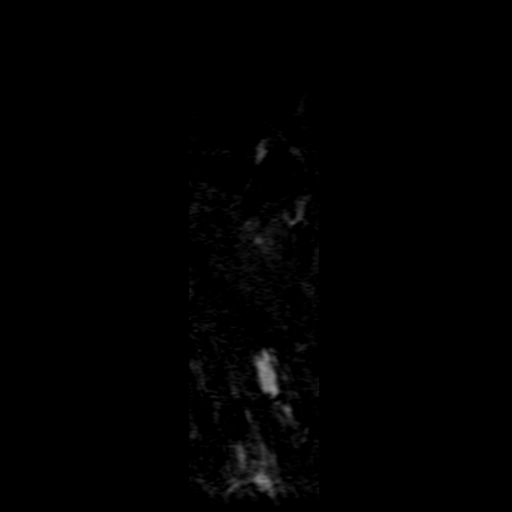
[im 49/49]
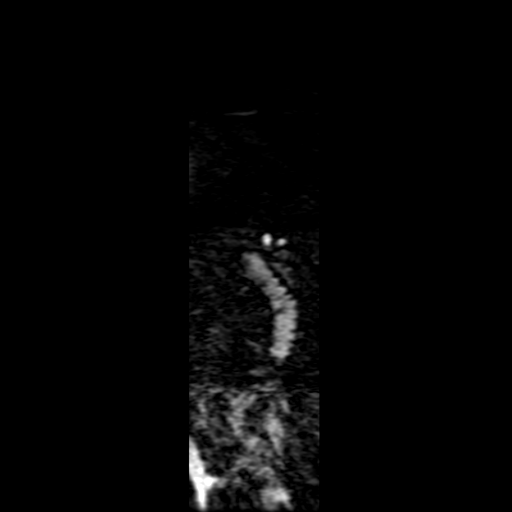

[Series 603: pjn · axial · 1.6mm · 0.62mm/px · 1 of 1 slices shown (4 of 4)]
[im 1/1]
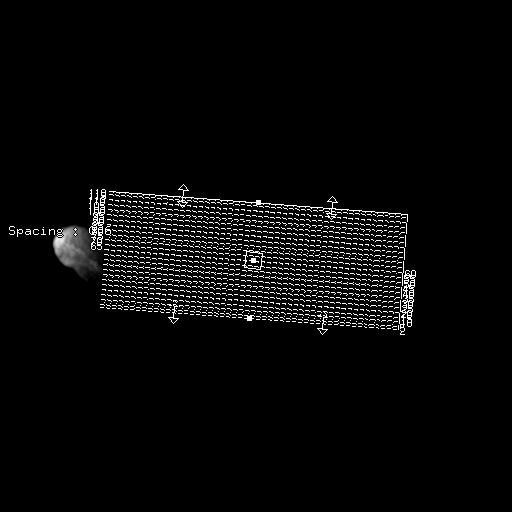

[20 of 48 positions shown; findings below may reference images not displayed]

FINDINGS: Lower chest: Small bilateral pleural effusions. Consolidative
opacities within the lower lungs bilaterally.

Hepatobiliary: Liver is normal in size and contour. There is
irregular wall thickening of the gallbladder. No cholelithiasis.
Possible filling defect within the distal common bile duct on MRCP
images (image 30; series 7). No intrahepatic or extrahepatic biliary
ductal dilatation.

Pancreas:  Unremarkable

Spleen:  Unremarkable

Adrenals/Urinary Tract: Normal adrenal glands. Kidneys enhance
symmetrically with contrast. No hydronephrosis.

Stomach/Bowel: Moderate sized hiatal hernia. Otherwise normal
morphology of the stomach. No evidence for small bowel obstruction.

Vascular/Lymphatic: Normal caliber abdominal aorta. No
retroperitoneal lymphadenopathy.

Other: There is ascites within the left upper quadrant. Additionally
there is perihepatic fluid which appears loculated along the
anterior hepatic margin (image 13; series 5). There is patchy
enhancement scattered throughout the omentum and colonic mesentery
within the central upper abdomen (image 57; series 8373).

Musculoskeletal: No aggressive or acute appearing osseous lesions.
IMPRESSION: 1. Persistent irregular wall thickening of the gallbladder which is
nonspecific in etiology. Findings may be secondary to acute
cholecystitis in the appropriate clinical setting. The possibility
of gallbladder malignancy is not excluded.
2. There appears to be loculated ascites within the left upper
quadrant and within the perihepatic locations, nonspecific in
etiology. Additionally, there is patchy enhancement involving the
omentum and upper abdominal colonic mesentery. While these findings
may be secondary to an acute infectious/inflammatory process, the
possibility of omental carcinomatosis and/or malignant ascites is
not entirely excluded, potentially if the gallbladder findings are
secondary to a malignancy.
3. Given the multitude of findings, many of which have changed or
progressed from prior CT, recommend further evaluation with contrast
enhanced CT of the abdomen and pelvis.
4. There is narrowing of the distal aspect of the common bile duct
on MRCP images which is likely artifactual in etiology. The
possibility of a small distal CBD stone is not entirely excluded.

## 2020-05-04 IMAGING — DX DG CHEST 1V PORT
1 series · 1 of 1 positions shown · non-contrast
Comparison: One-view chest x-ray 08/08/2019

CLINICAL DATA: Hypoxia.

EXAM:
PORTABLE CHEST 1 VIEW

[chest ap]
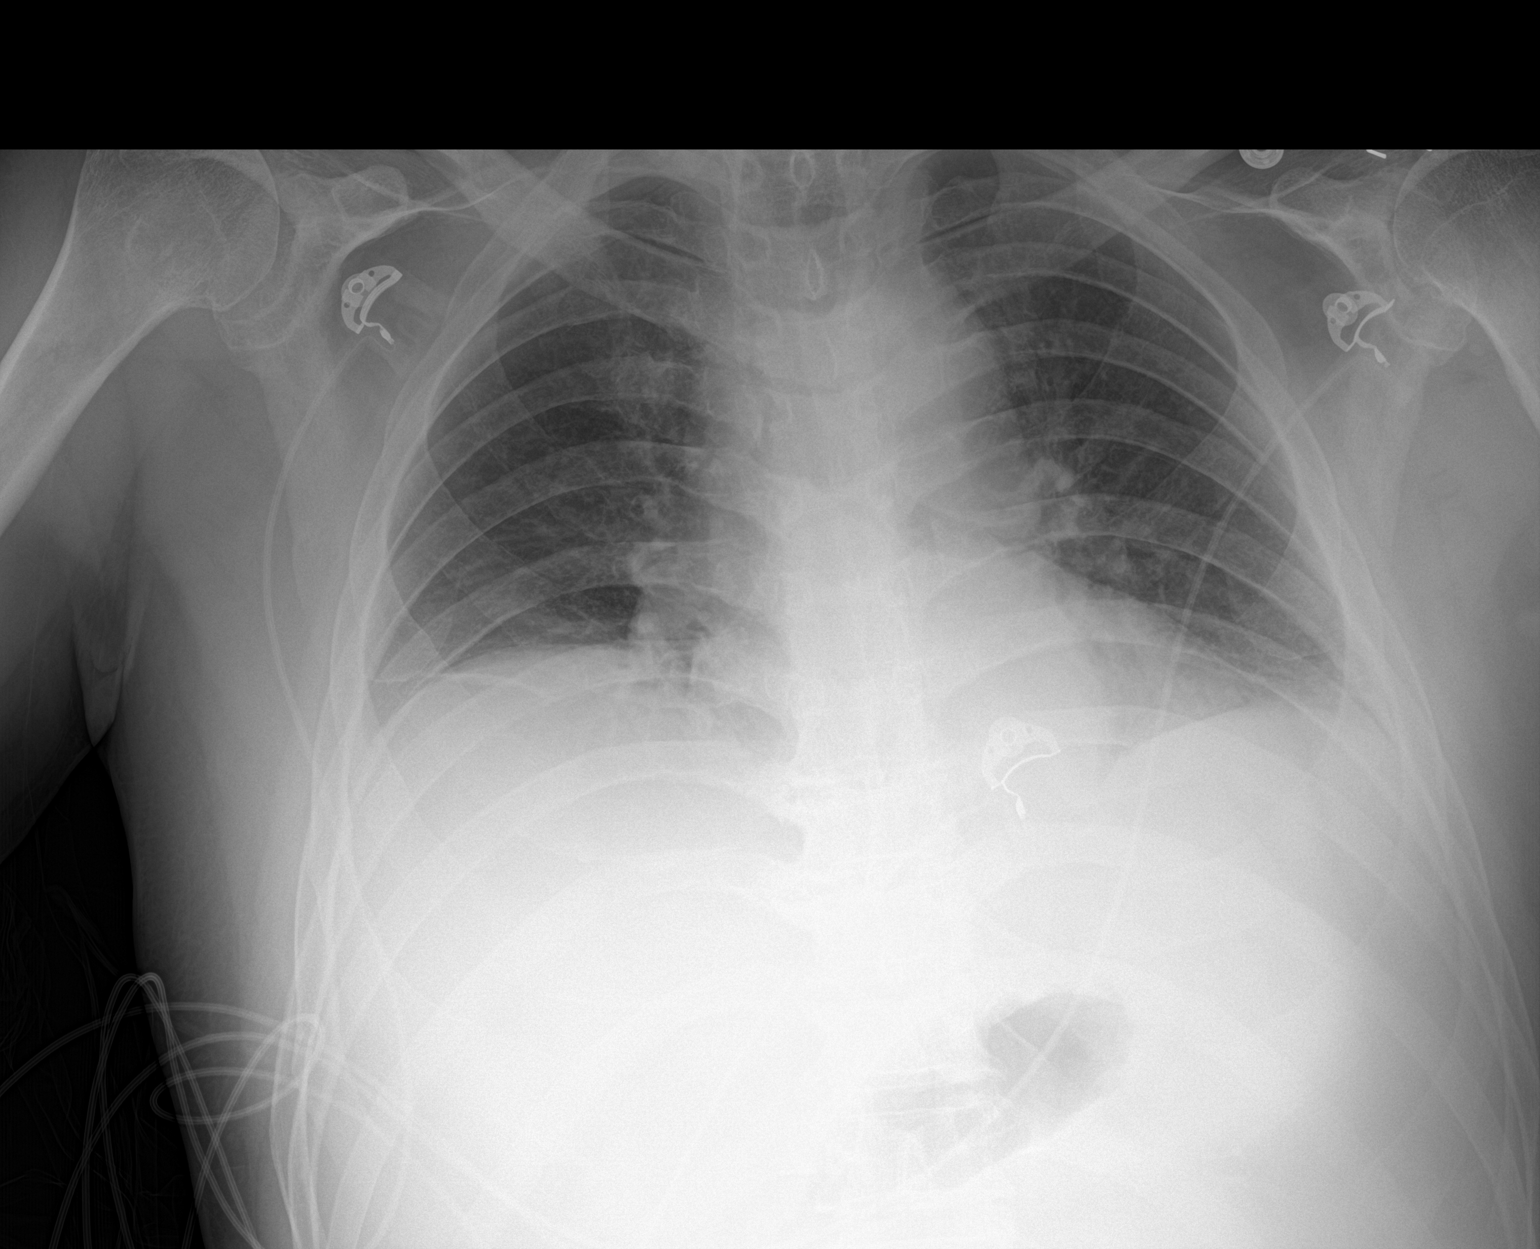

[1 of 1 positions shown; findings below may reference images not displayed]

FINDINGS: Heart size is exaggerated by low lung volumes. Moderate pulmonary
vascular congestion is present. Bilateral pleural effusions and
basilar airspace opacities are noted. The visualized soft tissues
and bony thorax are unremarkable.
IMPRESSION: 1. Stable appearance of bilateral pleural effusions and basilar
airspace disease. While this likely reflects atelectasis, infection
is not excluded.
2. Moderate pulmonary vascular congestion.

## 2021-09-22 DIAGNOSIS — Z1211 Encounter for screening for malignant neoplasm of colon: Secondary | ICD-10-CM | POA: Diagnosis not present

## 2021-10-19 DIAGNOSIS — H6123 Impacted cerumen, bilateral: Secondary | ICD-10-CM | POA: Diagnosis not present

## 2021-10-19 DIAGNOSIS — H60393 Other infective otitis externa, bilateral: Secondary | ICD-10-CM | POA: Diagnosis not present

## 2021-10-19 DIAGNOSIS — R030 Elevated blood-pressure reading, without diagnosis of hypertension: Secondary | ICD-10-CM | POA: Diagnosis not present

## 2022-02-05 DIAGNOSIS — R030 Elevated blood-pressure reading, without diagnosis of hypertension: Secondary | ICD-10-CM | POA: Diagnosis not present

## 2022-02-12 DIAGNOSIS — I10 Essential (primary) hypertension: Secondary | ICD-10-CM | POA: Diagnosis not present

## 2022-02-20 DIAGNOSIS — I10 Essential (primary) hypertension: Secondary | ICD-10-CM | POA: Diagnosis not present

## 2022-04-02 DIAGNOSIS — I10 Essential (primary) hypertension: Secondary | ICD-10-CM | POA: Diagnosis not present

## 2022-04-02 DIAGNOSIS — R944 Abnormal results of kidney function studies: Secondary | ICD-10-CM | POA: Diagnosis not present

## 2022-06-11 DIAGNOSIS — M25561 Pain in right knee: Secondary | ICD-10-CM | POA: Diagnosis not present

## 2022-06-11 DIAGNOSIS — M25562 Pain in left knee: Secondary | ICD-10-CM | POA: Diagnosis not present

## 2022-11-03 DIAGNOSIS — M25562 Pain in left knee: Secondary | ICD-10-CM | POA: Diagnosis not present

## 2022-11-03 DIAGNOSIS — M25561 Pain in right knee: Secondary | ICD-10-CM | POA: Diagnosis not present

## 2023-03-31 DIAGNOSIS — I10 Essential (primary) hypertension: Secondary | ICD-10-CM | POA: Diagnosis not present

## 2023-04-21 DIAGNOSIS — Z0000 Encounter for general adult medical examination without abnormal findings: Secondary | ICD-10-CM | POA: Diagnosis not present

## 2023-04-21 DIAGNOSIS — I10 Essential (primary) hypertension: Secondary | ICD-10-CM | POA: Diagnosis not present

## 2023-04-21 DIAGNOSIS — Z125 Encounter for screening for malignant neoplasm of prostate: Secondary | ICD-10-CM | POA: Diagnosis not present

## 2023-04-21 DIAGNOSIS — E785 Hyperlipidemia, unspecified: Secondary | ICD-10-CM | POA: Diagnosis not present

## 2023-04-21 DIAGNOSIS — Z1211 Encounter for screening for malignant neoplasm of colon: Secondary | ICD-10-CM | POA: Diagnosis not present

## 2023-05-19 DIAGNOSIS — Z1211 Encounter for screening for malignant neoplasm of colon: Secondary | ICD-10-CM | POA: Diagnosis not present

## 2023-07-07 DIAGNOSIS — H6123 Impacted cerumen, bilateral: Secondary | ICD-10-CM | POA: Diagnosis not present

## 2023-07-07 DIAGNOSIS — Z6828 Body mass index (BMI) 28.0-28.9, adult: Secondary | ICD-10-CM | POA: Diagnosis not present

## 2023-07-20 DIAGNOSIS — H6123 Impacted cerumen, bilateral: Secondary | ICD-10-CM | POA: Diagnosis not present

## 2023-07-20 DIAGNOSIS — I10 Essential (primary) hypertension: Secondary | ICD-10-CM | POA: Diagnosis not present

## 2023-07-20 DIAGNOSIS — Z6828 Body mass index (BMI) 28.0-28.9, adult: Secondary | ICD-10-CM | POA: Diagnosis not present

## 2023-07-27 DIAGNOSIS — E785 Hyperlipidemia, unspecified: Secondary | ICD-10-CM | POA: Diagnosis not present

## 2024-02-08 DIAGNOSIS — Z96641 Presence of right artificial hip joint: Secondary | ICD-10-CM | POA: Diagnosis not present

## 2024-02-08 DIAGNOSIS — M25551 Pain in right hip: Secondary | ICD-10-CM | POA: Diagnosis not present

## 2024-02-16 DIAGNOSIS — M1611 Unilateral primary osteoarthritis, right hip: Secondary | ICD-10-CM | POA: Diagnosis not present

## 2024-04-26 DIAGNOSIS — E785 Hyperlipidemia, unspecified: Secondary | ICD-10-CM | POA: Diagnosis not present

## 2024-04-26 DIAGNOSIS — I10 Essential (primary) hypertension: Secondary | ICD-10-CM | POA: Diagnosis not present

## 2024-08-29 DIAGNOSIS — M1611 Unilateral primary osteoarthritis, right hip: Secondary | ICD-10-CM | POA: Diagnosis not present

## 2024-08-29 DIAGNOSIS — M25551 Pain in right hip: Secondary | ICD-10-CM | POA: Diagnosis not present

## 2024-09-13 DIAGNOSIS — Z01818 Encounter for other preprocedural examination: Secondary | ICD-10-CM | POA: Diagnosis not present

## 2024-09-13 DIAGNOSIS — Z01810 Encounter for preprocedural cardiovascular examination: Secondary | ICD-10-CM | POA: Diagnosis not present

## 2024-09-13 DIAGNOSIS — I10 Essential (primary) hypertension: Secondary | ICD-10-CM | POA: Diagnosis not present

## 2024-09-13 DIAGNOSIS — M1611 Unilateral primary osteoarthritis, right hip: Secondary | ICD-10-CM | POA: Diagnosis not present
